# Patient Record
Sex: Male | Born: 1950 | Race: White | Hispanic: No | Marital: Married | Smoking: Never smoker
Health system: Southern US, Community
[De-identification: ages and names within clinical notes are randomized; demographics above are authoritative.]

## PROBLEM LIST (undated history)

## (undated) DIAGNOSIS — I10 Essential (primary) hypertension: Secondary | ICD-10-CM

## (undated) DIAGNOSIS — M1990 Unspecified osteoarthritis, unspecified site: Secondary | ICD-10-CM

## (undated) DIAGNOSIS — E669 Obesity, unspecified: Secondary | ICD-10-CM

## (undated) DIAGNOSIS — N183 Chronic kidney disease, stage 3 unspecified: Secondary | ICD-10-CM

## (undated) DIAGNOSIS — N289 Disorder of kidney and ureter, unspecified: Secondary | ICD-10-CM

## (undated) DIAGNOSIS — H269 Unspecified cataract: Secondary | ICD-10-CM

## (undated) DIAGNOSIS — C4491 Basal cell carcinoma of skin, unspecified: Secondary | ICD-10-CM

## (undated) HISTORY — DX: Unspecified osteoarthritis, unspecified site: M19.90

## (undated) HISTORY — PX: POLYPECTOMY: SHX149

## (undated) HISTORY — DX: Chronic kidney disease, stage 3 (moderate): N18.3

## (undated) HISTORY — DX: Basal cell carcinoma of skin, unspecified: C44.91

## (undated) HISTORY — DX: Chronic kidney disease, stage 3 unspecified: N18.30

## (undated) HISTORY — DX: Obesity, unspecified: E66.9

## (undated) HISTORY — DX: Unspecified cataract: H26.9

## (undated) HISTORY — DX: Disorder of kidney and ureter, unspecified: N28.9

## (undated) HISTORY — DX: Essential (primary) hypertension: I10

## (undated) HISTORY — PX: OTHER SURGICAL HISTORY: SHX169

---

## 2004-08-05 ENCOUNTER — Ambulatory Visit: Payer: Self-pay | Admitting: Internal Medicine

## 2004-10-14 ENCOUNTER — Ambulatory Visit: Payer: Self-pay | Admitting: Internal Medicine

## 2004-11-11 ENCOUNTER — Ambulatory Visit: Payer: Self-pay | Admitting: Internal Medicine

## 2004-12-02 ENCOUNTER — Ambulatory Visit: Payer: Self-pay | Admitting: Internal Medicine

## 2005-03-10 ENCOUNTER — Ambulatory Visit: Payer: Self-pay | Admitting: Internal Medicine

## 2005-06-30 ENCOUNTER — Ambulatory Visit: Payer: Self-pay | Admitting: Internal Medicine

## 2005-10-06 ENCOUNTER — Ambulatory Visit: Payer: Self-pay | Admitting: Internal Medicine

## 2005-12-06 ENCOUNTER — Ambulatory Visit: Payer: Self-pay | Admitting: Physical Medicine & Rehabilitation

## 2005-12-06 ENCOUNTER — Encounter
Admission: RE | Admit: 2005-12-06 | Discharge: 2006-03-06 | Payer: Self-pay | Admitting: Physical Medicine & Rehabilitation

## 2005-12-13 ENCOUNTER — Encounter
Admission: RE | Admit: 2005-12-13 | Discharge: 2006-02-23 | Payer: Self-pay | Admitting: Physical Medicine & Rehabilitation

## 2006-01-03 ENCOUNTER — Ambulatory Visit: Payer: Self-pay | Admitting: Internal Medicine

## 2006-04-27 ENCOUNTER — Ambulatory Visit: Payer: Self-pay | Admitting: Internal Medicine

## 2006-08-10 ENCOUNTER — Ambulatory Visit: Payer: Self-pay | Admitting: Internal Medicine

## 2006-08-10 LAB — CONVERTED CEMR LAB: Microalb Creat Ratio: 83.3 mg/g — ABNORMAL HIGH (ref 0.0–30.0)

## 2006-09-28 ENCOUNTER — Ambulatory Visit: Payer: Self-pay | Admitting: Internal Medicine

## 2006-09-28 LAB — CONVERTED CEMR LAB
CO2: 27 meq/L (ref 19–32)
Calcium: 9.6 mg/dL (ref 8.4–10.5)
Chloride: 104 meq/L (ref 96–112)
GFR calc non Af Amer: 56 mL/min
Glucose, Bld: 134 mg/dL — ABNORMAL HIGH (ref 70–99)

## 2006-11-09 ENCOUNTER — Ambulatory Visit: Payer: Self-pay | Admitting: Internal Medicine

## 2006-11-09 ENCOUNTER — Encounter: Payer: Self-pay | Admitting: Internal Medicine

## 2006-11-09 DIAGNOSIS — E119 Type 2 diabetes mellitus without complications: Secondary | ICD-10-CM

## 2006-11-09 DIAGNOSIS — E669 Obesity, unspecified: Secondary | ICD-10-CM | POA: Insufficient documentation

## 2006-11-09 DIAGNOSIS — I10 Essential (primary) hypertension: Secondary | ICD-10-CM

## 2006-11-09 DIAGNOSIS — M722 Plantar fascial fibromatosis: Secondary | ICD-10-CM | POA: Insufficient documentation

## 2006-11-23 ENCOUNTER — Encounter: Payer: Self-pay | Admitting: Internal Medicine

## 2006-11-23 ENCOUNTER — Ambulatory Visit: Payer: Self-pay | Admitting: Internal Medicine

## 2006-11-23 LAB — CONVERTED CEMR LAB
Calcium: 9.3 mg/dL (ref 8.4–10.5)
GFR calc Af Amer: 81 mL/min
GFR calc non Af Amer: 67 mL/min
Glucose, Bld: 135 mg/dL — ABNORMAL HIGH (ref 70–99)
HDL: 34.6 mg/dL — ABNORMAL LOW (ref >39.0)
Microalb Creat Ratio: 41.8 mg/g — ABNORMAL HIGH (ref 0.0–30.0)
Potassium: 4.6 meq/L (ref 3.5–5.1)
Total CHOL/HDL Ratio: 4.7
Triglycerides: 229 mg/dL (ref 0–149)

## 2007-02-15 ENCOUNTER — Encounter (INDEPENDENT_AMBULATORY_CARE_PROVIDER_SITE_OTHER): Payer: Self-pay | Admitting: *Deleted

## 2007-02-15 ENCOUNTER — Ambulatory Visit: Payer: Self-pay | Admitting: Internal Medicine

## 2007-02-15 LAB — CONVERTED CEMR LAB
Bilirubin Urine: NEGATIVE
Blood in Urine, dipstick: NEGATIVE
Ketones, urine, test strip: NEGATIVE
Protein, U semiquant: NEGATIVE
Urobilinogen, UA: NEGATIVE
pH: 5

## 2007-02-28 ENCOUNTER — Encounter: Payer: Self-pay | Admitting: Internal Medicine

## 2007-03-23 ENCOUNTER — Ambulatory Visit: Payer: Self-pay | Admitting: Internal Medicine

## 2007-03-23 LAB — CONVERTED CEMR LAB
OCCULT 1: NEGATIVE
OCCULT 2: NEGATIVE

## 2007-03-27 ENCOUNTER — Encounter (INDEPENDENT_AMBULATORY_CARE_PROVIDER_SITE_OTHER): Payer: Self-pay | Admitting: *Deleted

## 2007-06-26 ENCOUNTER — Ambulatory Visit: Payer: Self-pay | Admitting: Internal Medicine

## 2007-06-28 ENCOUNTER — Ambulatory Visit: Payer: Self-pay | Admitting: Internal Medicine

## 2007-07-03 LAB — CONVERTED CEMR LAB
BUN: 21 mg/dL (ref 6–23)
CO2: 27 meq/L (ref 19–32)
Calcium: 9.4 mg/dL (ref 8.4–10.5)
Creatinine, Ser: 1.4 mg/dL (ref 0.4–1.5)
GFR calc Af Amer: 68 mL/min
Hgb A1c MFr Bld: 8 % — ABNORMAL HIGH (ref 4.6–6.0)
Potassium: 4.6 meq/L (ref 3.5–5.1)

## 2007-08-25 ENCOUNTER — Telehealth (INDEPENDENT_AMBULATORY_CARE_PROVIDER_SITE_OTHER): Payer: Self-pay | Admitting: *Deleted

## 2007-09-19 ENCOUNTER — Ambulatory Visit: Payer: Self-pay | Admitting: Internal Medicine

## 2007-09-25 LAB — CONVERTED CEMR LAB
Cholesterol: 175 mg/dL (ref 0–200)
HDL: 34.6 mg/dL — ABNORMAL LOW (ref >39.0)
LDL Cholesterol: 101 mg/dL — ABNORMAL HIGH (ref 0–99)
Microalb Creat Ratio: 57.8 mg/g — ABNORMAL HIGH (ref 0.0–30.0)
Microalb, Ur: 12.4 mg/dL — ABNORMAL HIGH (ref 0.0–1.9)

## 2007-10-31 ENCOUNTER — Telehealth (INDEPENDENT_AMBULATORY_CARE_PROVIDER_SITE_OTHER): Payer: Self-pay | Admitting: *Deleted

## 2007-12-18 ENCOUNTER — Ambulatory Visit: Payer: Self-pay | Admitting: Internal Medicine

## 2007-12-20 ENCOUNTER — Telehealth: Payer: Self-pay | Admitting: Internal Medicine

## 2007-12-20 LAB — CONVERTED CEMR LAB
Chloride: 105 meq/L (ref 96–112)
Creatinine,U: 112.6 mg/dL
GFR calc Af Amer: 58 mL/min
GFR calc non Af Amer: 48 mL/min
Hgb A1c MFr Bld: 7.6 % — ABNORMAL HIGH (ref 4.6–6.0)
Microalb Creat Ratio: 60.4 mg/g — ABNORMAL HIGH (ref 0.0–30.0)
Microalb, Ur: 6.8 mg/dL — ABNORMAL HIGH (ref 0.0–1.9)
Potassium: 4.6 meq/L (ref 3.5–5.1)
Sodium: 135 meq/L (ref 135–145)

## 2008-03-12 ENCOUNTER — Encounter: Payer: Self-pay | Admitting: Internal Medicine

## 2008-03-20 ENCOUNTER — Ambulatory Visit: Payer: Self-pay | Admitting: Internal Medicine

## 2008-03-25 ENCOUNTER — Telehealth (INDEPENDENT_AMBULATORY_CARE_PROVIDER_SITE_OTHER): Payer: Self-pay | Admitting: *Deleted

## 2008-03-25 LAB — CONVERTED CEMR LAB
ALT: 35 units/L (ref 0–53)
AST: 24 units/L (ref 0–37)
BUN: 32 mg/dL — ABNORMAL HIGH (ref 6–23)
CO2: 24 meq/L (ref 19–32)
Calcium: 9.4 mg/dL (ref 8.4–10.5)
Creatinine, Ser: 1.9 mg/dL — ABNORMAL HIGH (ref 0.4–1.5)
GFR calc Af Amer: 47 mL/min
Glucose, Bld: 113 mg/dL — ABNORMAL HIGH (ref 70–99)
Microalb, Ur: 3.7 mg/dL — ABNORMAL HIGH (ref 0.0–1.9)

## 2008-06-18 ENCOUNTER — Ambulatory Visit: Payer: Self-pay | Admitting: Internal Medicine

## 2008-06-18 DIAGNOSIS — N183 Chronic kidney disease, stage 3 (moderate): Secondary | ICD-10-CM

## 2008-06-19 ENCOUNTER — Encounter: Payer: Self-pay | Admitting: Internal Medicine

## 2008-09-11 ENCOUNTER — Encounter: Payer: Self-pay | Admitting: Internal Medicine

## 2008-09-19 ENCOUNTER — Ambulatory Visit: Payer: Self-pay | Admitting: Internal Medicine

## 2008-09-19 ENCOUNTER — Encounter (INDEPENDENT_AMBULATORY_CARE_PROVIDER_SITE_OTHER): Payer: Self-pay | Admitting: *Deleted

## 2008-10-08 ENCOUNTER — Ambulatory Visit: Payer: Self-pay | Admitting: Internal Medicine

## 2008-10-14 ENCOUNTER — Encounter (INDEPENDENT_AMBULATORY_CARE_PROVIDER_SITE_OTHER): Payer: Self-pay | Admitting: *Deleted

## 2008-12-23 ENCOUNTER — Encounter (INDEPENDENT_AMBULATORY_CARE_PROVIDER_SITE_OTHER): Payer: Self-pay | Admitting: *Deleted

## 2009-01-22 ENCOUNTER — Ambulatory Visit: Payer: Self-pay | Admitting: Internal Medicine

## 2009-01-30 ENCOUNTER — Telehealth (INDEPENDENT_AMBULATORY_CARE_PROVIDER_SITE_OTHER): Payer: Self-pay | Admitting: *Deleted

## 2009-01-30 LAB — CONVERTED CEMR LAB
AST: 26 units/L (ref 0–37)
Calcium: 9.5 mg/dL (ref 8.4–10.5)
Creatinine, Ser: 1.8 mg/dL — ABNORMAL HIGH (ref 0.4–1.5)
GFR calc non Af Amer: 41.49 mL/min (ref >60)
Glucose, Bld: 90 mg/dL (ref 70–99)
Hgb A1c MFr Bld: 6.5 % (ref 4.6–6.5)
Sodium: 138 meq/L (ref 135–145)

## 2009-03-17 ENCOUNTER — Encounter: Payer: Self-pay | Admitting: Internal Medicine

## 2009-03-27 ENCOUNTER — Encounter: Payer: Self-pay | Admitting: Internal Medicine

## 2009-05-06 ENCOUNTER — Encounter: Payer: Self-pay | Admitting: Internal Medicine

## 2009-05-23 ENCOUNTER — Ambulatory Visit: Payer: Self-pay | Admitting: Internal Medicine

## 2009-05-27 ENCOUNTER — Encounter (INDEPENDENT_AMBULATORY_CARE_PROVIDER_SITE_OTHER): Payer: Self-pay | Admitting: *Deleted

## 2009-05-27 LAB — CONVERTED CEMR LAB
Calcium: 9.5 mg/dL (ref 8.4–10.5)
Creatinine, Ser: 1.7 mg/dL — ABNORMAL HIGH (ref 0.4–1.5)
Creatinine,U: 81 mg/dL
GFR calc non Af Amer: 44.27 mL/min (ref >60)
Glucose, Bld: 104 mg/dL — ABNORMAL HIGH (ref 70–99)
Sodium: 138 meq/L (ref 135–145)

## 2009-09-26 ENCOUNTER — Ambulatory Visit: Payer: Self-pay | Admitting: Internal Medicine

## 2009-09-29 LAB — CONVERTED CEMR LAB
ALT: 40 units/L (ref 0–53)
BUN: 23 mg/dL (ref 6–23)
Basophils Absolute: 0.1 10*3/uL (ref 0.0–0.1)
Chloride: 105 meq/L (ref 96–112)
Creatinine, Ser: 1.7 mg/dL — ABNORMAL HIGH (ref 0.4–1.5)
Direct LDL: 108.9 mg/dL
Eosinophils Relative: 5.2 % — ABNORMAL HIGH (ref 0.0–5.0)
Glucose, Bld: 123 mg/dL — ABNORMAL HIGH (ref 70–99)
HCT: 40.7 % (ref 39.0–52.0)
HDL: 44 mg/dL (ref >39.00)
Hgb A1c MFr Bld: 6.6 % — ABNORMAL HIGH (ref 4.6–6.5)
Lymphocytes Relative: 20.5 % (ref 12.0–46.0)
Lymphs Abs: 1.4 10*3/uL (ref 0.7–4.0)
Monocytes Relative: 8.1 % (ref 3.0–12.0)
Neutrophils Relative %: 65.1 % (ref 43.0–77.0)
PSA: 0.63 ng/mL (ref 0.10–4.00)
Platelets: 288 10*3/uL (ref 150.0–400.0)
Potassium: 4.9 meq/L (ref 3.5–5.1)
RDW: 12.6 % (ref 11.5–14.6)
Total CHOL/HDL Ratio: 4
Triglycerides: 201 mg/dL — ABNORMAL HIGH (ref 0.0–149.0)
VLDL: 40.2 mg/dL — ABNORMAL HIGH (ref 0.0–40.0)
WBC: 6.9 10*3/uL (ref 4.5–10.5)

## 2010-01-13 ENCOUNTER — Encounter: Payer: Self-pay | Admitting: Internal Medicine

## 2010-01-29 ENCOUNTER — Encounter (INDEPENDENT_AMBULATORY_CARE_PROVIDER_SITE_OTHER): Payer: Self-pay | Admitting: *Deleted

## 2010-01-30 ENCOUNTER — Ambulatory Visit: Payer: Self-pay | Admitting: Internal Medicine

## 2010-01-30 ENCOUNTER — Telehealth (INDEPENDENT_AMBULATORY_CARE_PROVIDER_SITE_OTHER): Payer: Self-pay | Admitting: *Deleted

## 2010-01-30 LAB — HM DIABETES FOOT EXAM

## 2010-02-02 ENCOUNTER — Ambulatory Visit: Payer: Self-pay | Admitting: Gastroenterology

## 2010-02-02 ENCOUNTER — Encounter (INDEPENDENT_AMBULATORY_CARE_PROVIDER_SITE_OTHER): Payer: Self-pay | Admitting: *Deleted

## 2010-02-04 ENCOUNTER — Encounter (INDEPENDENT_AMBULATORY_CARE_PROVIDER_SITE_OTHER): Payer: Self-pay | Admitting: *Deleted

## 2010-02-05 LAB — CONVERTED CEMR LAB
Calcium: 9.6 mg/dL (ref 8.4–10.5)
GFR calc non Af Amer: 37.47 mL/min (ref >60)
Glucose, Bld: 126 mg/dL — ABNORMAL HIGH (ref 70–99)
Hgb A1c MFr Bld: 6.5 % (ref 4.6–6.5)
Potassium: 5.3 meq/L — ABNORMAL HIGH (ref 3.5–5.1)
Sodium: 137 meq/L (ref 135–145)

## 2010-02-13 ENCOUNTER — Ambulatory Visit: Payer: Self-pay | Admitting: Gastroenterology

## 2010-02-17 ENCOUNTER — Encounter: Payer: Self-pay | Admitting: Gastroenterology

## 2010-02-25 ENCOUNTER — Telehealth (INDEPENDENT_AMBULATORY_CARE_PROVIDER_SITE_OTHER): Payer: Self-pay | Admitting: *Deleted

## 2010-02-26 ENCOUNTER — Ambulatory Visit: Payer: Self-pay | Admitting: Internal Medicine

## 2010-02-27 LAB — CONVERTED CEMR LAB
CO2: 25 meq/L (ref 19–32)
Calcium: 9.3 mg/dL (ref 8.4–10.5)
Chloride: 102 meq/L (ref 96–112)
Creatinine, Ser: 1.8 mg/dL — ABNORMAL HIGH (ref 0.4–1.5)
Sodium: 135 meq/L (ref 135–145)

## 2010-03-17 ENCOUNTER — Encounter: Payer: Self-pay | Admitting: Internal Medicine

## 2010-04-22 ENCOUNTER — Telehealth: Payer: Self-pay | Admitting: Internal Medicine

## 2010-07-03 ENCOUNTER — Ambulatory Visit: Payer: Self-pay | Admitting: Internal Medicine

## 2010-07-03 DIAGNOSIS — F528 Other sexual dysfunction not due to a substance or known physiological condition: Secondary | ICD-10-CM

## 2010-07-03 DIAGNOSIS — M79609 Pain in unspecified limb: Secondary | ICD-10-CM | POA: Insufficient documentation

## 2010-07-03 LAB — CONVERTED CEMR LAB: Vit D, 25-Hydroxy: 37 ng/mL (ref 30–89)

## 2010-07-10 ENCOUNTER — Encounter (INDEPENDENT_AMBULATORY_CARE_PROVIDER_SITE_OTHER): Payer: Self-pay | Admitting: *Deleted

## 2010-07-10 LAB — CONVERTED CEMR LAB
AST: 37 units/L (ref 0–37)
Chloride: 104 meq/L (ref 96–112)
GFR calc non Af Amer: 46.29 mL/min (ref >60)
Glucose, Bld: 116 mg/dL — ABNORMAL HIGH (ref 70–99)
Hgb A1c MFr Bld: 7 % — ABNORMAL HIGH (ref 4.6–6.5)
Potassium: 4.6 meq/L (ref 3.5–5.1)
Sodium: 139 meq/L (ref 135–145)
Total CK: 92 units/L (ref 7–232)

## 2010-08-04 ENCOUNTER — Encounter: Payer: Self-pay | Admitting: Internal Medicine

## 2010-09-27 LAB — CONVERTED CEMR LAB
ALT: 31 units/L (ref 0–53)
BUN: 23 mg/dL
Basophils Absolute: 0 10*3/uL (ref 0.0–0.1)
CO2, serum: 26 mmol/L
Calcium: 9.7 mg/dL
Cholesterol: 163 mg/dL (ref 0–200)
Creatinine, Ser: 1.67 mg/dL
Creatinine,U: 176.8 mg/dL
Eosinophils Absolute: 0.3 10*3/uL (ref 0.0–0.6)
Eosinophils Relative: 5.6 % — ABNORMAL HIGH (ref 0.0–5.0)
Glucose, Bld: 121 mg/dL
HCT: 40.7 % (ref 39.0–52.0)
HDL: 30.3 mg/dL — ABNORMAL LOW (ref >39.0)
Hemoglobin: 13.9 g/dL (ref 13.0–17.0)
Hgb A1c MFr Bld: 7.1 % — ABNORMAL HIGH (ref 4.6–6.0)
LDL Cholesterol: 100 mg/dL — ABNORMAL HIGH (ref 0–99)
MCHC: 35 g/dL (ref 30.0–36.0)
MCV: 92.9 fL (ref 78.0–100.0)
Microalb Creat Ratio: 46.9 mg/g — ABNORMAL HIGH (ref 0.0–30.0)
Monocytes Absolute: 0.7 10*3/uL (ref 0.1–1.0)
Monocytes Relative: 7.8 % (ref 3.0–11.0)
Monocytes Relative: 8.7 % (ref 3.0–12.0)
Neutro Abs: 4.8 10*3/uL (ref 1.4–7.7)
PSA: 0.49 ng/mL (ref 0.10–4.00)
PSA: 0.63 ng/mL (ref 0.10–4.00)
RBC: 4.25 M/uL (ref 4.22–5.81)
RBC: 4.3 M/uL (ref 4.22–5.81)
RDW: 12.3 % (ref 11.5–14.6)
TSH: 1.71 microintl units/mL (ref 0.35–5.50)
Total CHOL/HDL Ratio: 4.1
Total CHOL/HDL Ratio: 5.4
Triglycerides: 143 mg/dL (ref 0–149)
Triglycerides: 162 mg/dL — ABNORMAL HIGH (ref 0–149)
WBC: 7.5 10*3/uL (ref 4.5–10.5)

## 2010-09-29 NOTE — Letter (Signed)
Summary: [facility] Instructions  [facility]  [address] [place], [place] [address]   Phone: [phone]  Fax: [phone]       Jimmy [name]    01-18-1951    MRN: [id number]        Procedure Day /Date: Friday 02/13/2010     Arrival Time: 10:00 am      Procedure Time: 11:00 am     Location of Procedure:                    _x _  [facility] (4th Floor)                        PREPARATION FOR COLONOSCOPY WITH MOVIPREP   Starting 5 days prior to your procedure Sunday 6/12 do not eat nuts, seeds, popcorn, corn, beans, peas,  salads, or any raw vegetables.  Do not take any fiber supplements (e.g. Metamucil, Citrucel, and Benefiber).  THE DAY BEFORE YOUR PROCEDURE         DATE: Thursday 6/16  1.  Drink clear liquids the entire day-NO SOLID FOOD  2.  Do not drink anything colored red or purple.  Avoid juices with pulp.  No orange juice.  3.  Drink at least 64 oz. (8 glasses) of fluid/clear liquids during the day to prevent dehydration and help the prep work efficiently.  CLEAR LIQUIDS INCLUDE: Water Jello Ice Popsicles Tea (sugar ok, no milk/cream) Powdered fruit flavored drinks Coffee (sugar ok, no milk/cream) Gatorade Juice: apple, white grape, white cranberry  Lemonade Clear bullion, consomm, broth Carbonated beverages (any kind) Strained chicken noodle soup Hard Candy                             4.  In the morning, mix first dose of MoviPrep solution:    Empty 1 Pouch A and 1 Pouch B into the disposable container    Add lukewarm drinking water to the top line of the container. Mix to dissolve    Refrigerate (mixed solution should be used within 24 hrs)  5.  Begin drinking the prep at 5:00 p.m. The MoviPrep container is divided by 4 marks.   Every 15 minutes drink the solution down to the next mark (approximately 8 oz) until the full liter is complete.   6.  Follow completed prep with 16 oz of clear liquid of your choice (Nothing red  or purple).  Continue to drink clear liquids until bedtime.  7.  Before going to bed, mix second dose of MoviPrep solution:    Empty 1 Pouch A and 1 Pouch B into the disposable container    Add lukewarm drinking water to the top line of the container. Mix to dissolve    Refrigerate  THE DAY OF YOUR PROCEDURE      DATE: Friday 6/17  Beginning at 6:00 a.m. (5 hours before procedure):         1. Every 15 minutes, drink the solution down to the next mark (approx 8 oz) until the full liter is complete.  2. Follow completed prep with 16 oz. of clear liquid of your choice.    3. You may drink clear liquids until 9:00 am (2 HOURS BEFORE PROCEDURE).   MEDICATION INSTRUCTIONS  Unless otherwise instructed, you should take regular prescription medications with a small sip of water   as early as possible the morning of your  procedure.  Diabetic patients - see separate instructions.         OTHER INSTRUCTIONS  You will need a responsible adult at least 59 years of age to accompany you and drive you home.   This person must remain in the waiting room during your procedure.  Wear loose fitting clothing that is easily removed.  Leave jewelry and other valuables at home.  However, you may wish to bring a book to read or  an iPod/MP3 player to listen to music as you wait for your procedure to start.  Remove all body piercing jewelry and leave at home.  Total time from sign-in until discharge is approximately 2-3 hours.  You should go home directly after your procedure and rest.  You can resume normal activities the  day after your procedure.  The day of your procedure you should not:   Drive   Make legal decisions   Operate machinery   Drink alcohol   Return to work  You will receive specific instructions about eating, activities and medications before you leave.    The above instructions have been reviewed and explained to me by   Karl Bales RN  February 02, 2010  10:53 AM    I fully understand and can verbalize these instructions _____________________________ Date _________

## 2010-09-29 NOTE — Letter (Signed)
Summary: Results Letter   Gastroenterology  [address] [place], [place] [address]   Phone: [phone]  Fax: [phone]        February 17, 2010 MRN: [id number]    Jimmy [name] [address] [place], [place]  [address]    Dear Mr. Aldape,   At least one of the polyps removed during your recent procedure was proven to be adenomatous.  These are pre-cancerous polyps that may have grown into cancers if they had not been removed.  Based on current nationally recognized surveillance guidelines, I recommend that you have a repeat colonoscopy in 5 years.  We will therefore put your information in our reminder system and will contact you in 5 years to schedule a repeat procedure.  Please call if you have any questions or concerns.       Sincerely,  Rachael Fee MD  This letter has been electronically signed by your physician.  Appended Document: Results Letter letter mailed.

## 2010-09-29 NOTE — Assessment & Plan Note (Signed)
Summary: CPX--YEARLY//PH   Vital Signs:  Patient profile:   60 year old male Height:      71 inches Weight:      325.6 pounds BMI:     45.58 Pulse rate:   80 / minute Pulse rhythm:   regular BP sitting:   142 / 80  (left arm) Cuff size:   large  Vitals Entered By: Shary Decamp (September 26, 2009 8:08 AM) CC: cpx - fasting, pt has not had colonoscopy pt would like to wait until June Is Patient Diabetic? Yes   History of Present Illness: CPX not able to exercise as much due to work dry-flaky skin in the winters, facial, hydrocortisone OTC helps   Preventive Screening-Counseling & Management  Alcohol-Tobacco     Alcohol drinks/day: seldom     Smoking Status: never  Caffeine-Diet-Exercise     Caffeine use/day: 1-2     Does Patient Exercise: no  Current Medications (verified): 1)  Micardis 80 Mg  Tabs (Telmisartan) .Marland Kitchen.. 1 By Mouth Qd 2)  Norvasc 10 Mg  Tabs (Amlodipine Besylate) .... 1/2 By Mouth Qd 3)  Bayer Childrens Aspirin 81 Mg  Chew (Aspirin) .Marland Kitchen.. 1 By Mouth Once Daily 4)  Viagra 100 Mg  Tabs (Sildenafil Citrate) .... As Needed 5)  Metformin Hcl 1000 Mg  Tabs (Metformin Hcl) .... Bid 6)  Metoprolol Tartrate 50 Mg  Tabs (Metoprolol Tartrate) .Marland Kitchen.. 1 Bid 7)  Actos 30 Mg  Tabs (Pioglitazone Hcl) .Marland Kitchen.. 1 By Mouth Qd  Allergies (verified): 1)  ! Diovan 2)  Benazepril Hcl (Benazepril Hcl)  Past History:  Past Medical History: Reviewed history from 05/23/2009 and no changes required. HYPERTENSION   DIABETES MELLITUS, TYPE II   OBESITY NOS  PLANTAR FASCIITIS, BILATERAL  Renal insufficiency  Past Surgical History: Reviewed history from 09/19/2008 and no changes required. pilonidal cyst surgery  Family History: Reviewed history from 09/19/2008 and no changes required. colon ca--no prostate cancer--no MI-- GF 19s y/o DM-- no M- deceased, Ca F - deceased - kidney failure  Social History: Married 4 children Occupation: Risk analyst, Runner, broadcasting/film/video @ [employer] not  able to exercise as much due to work not eating as healthy tobacco--no ETOH--sociallyCaffeine use/day:  1-2 Does Patient Exercise:  no  Review of Systems General:  Denies fever and weight loss; (+) fatigue, working long hours . CV:  Denies chest pain or discomfort, palpitations, and swelling of feet. Resp:  Denies cough and shortness of breath. GI:  Denies nausea and vomiting; blood in the stool x 1last month , happened w/ painful defecation , remote h/o hemorrhoids . GU:  Denies dysuria and hematuria.  Physical Exam  General:  alert, well-developed, and overweight-appearing.   Neck:  no masses, no thyromegaly, and normal carotid upstroke.   Lungs:  normal respiratory effort, no intercostal retractions, no accessory muscle use, and normal breath sounds.   Heart:  normal rate, regular rhythm, no murmur, and no gallop.   Abdomen:  soft, non-tender, and no distention.   Rectal:  small external hemorrhoids x 2 noted. Normal sphincter tone. No rectal masses or tenderness.  brown stools, Hemoccult negative Prostate:  Prostate gland firm and smooth, no enlargement, nodularity, tenderness, mass, asymmetry or induration. Extremities:  trace symetric edema   Impression & Recommendations:  Problem # 1:  HEALTH MAINTENANCE EXAM (ICD-V70.0) Td 08 had flu shot pneumonia shot 09/26/09  hemocults (-) 2-10 had a single episode of painful defecation with a small amount of blood per rectum.  Exams showed external  hemorrhoids, Hemoccult negative today. As long as he has a colonoscopy in June as he is planning, no further workup.  Patient to call  if he has further blood in the stools.   patient education: Reinforced the need to keep active and eat well.    Orders: TLB-ALT (SGPT) (84460-ALT) TLB-AST (SGOT) (84450-SGOT) TLB-Lipid Panel (80061-LIPID) TLB-CBC Platelet - w/Differential (85025-CBCD) TLB-PSA (Prostate Specific Antigen) (84153-PSA) Gastroenterology Referral (GI)  Problem # 2:   HYPERTENSION (ICD-401.9) no change His updated medication list for this problem includes:    Micardis 80 Mg Tabs (Telmisartan) .Marland Kitchen... 1 by mouth qd    Norvasc 10 Mg Tabs (Amlodipine besylate) .Marland Kitchen... 1/2 by mouth qd    Metoprolol Tartrate 50 Mg Tabs (Metoprolol tartrate) .Marland Kitchen... 1 bid  Orders: Venipuncture ([address]) TLB-BMP (Basic Metabolic Panel-BMET) (80048-METABOL)  BP today: 142/80 Prior BP: 132/80 (05/23/2009)  Labs Reviewed: K+: 5.1 (05/23/2009) Creat: : 1.7 (05/23/2009)   Chol: 166 (09/19/2008)   HDL: 40.1 (09/19/2008)   LDL: 97 (09/19/2008)   TG: 143 (09/19/2008)  Problem # 3:  DIABETES MELLITUS, TYPE II (ICD-250.00) due for labs His updated medication list for this problem includes:    Micardis 80 Mg Tabs (Telmisartan) .Marland Kitchen... 1 by mouth qd    Bayer Childrens Aspirin 81 Mg Chew (Aspirin) .Marland Kitchen... 1 by mouth once daily    Metformin Hcl 1000 Mg Tabs (Metformin hcl) ..... Bid    Actos 30 Mg Tabs (Pioglitazone hcl) .Marland Kitchen... 1 by mouth qd  Orders: TLB-A1C / Hgb A1C (Glycohemoglobin) (83036-A1C)  Labs Reviewed: Creat: 1.7 (05/23/2009)     Last Eye Exam: normal (03/17/2009) Reviewed HgBA1c results: 6.2 (05/23/2009)  6.5 (01/22/2009)  Complete Medication List: 1)  Micardis 80 Mg Tabs (Telmisartan) .Marland Kitchen.. 1 by mouth qd 2)  Norvasc 10 Mg Tabs (Amlodipine besylate) .... 1/2 by mouth qd 3)  Bayer Childrens Aspirin 81 Mg Chew (Aspirin) .Marland Kitchen.. 1 by mouth once daily 4)  Viagra 100 Mg Tabs (Sildenafil citrate) .... As needed 5)  Metformin Hcl 1000 Mg Tabs (Metformin hcl) .... Bid 6)  Metoprolol Tartrate 50 Mg Tabs (Metoprolol tartrate) .Marland Kitchen.. 1 bid 7)  Actos 30 Mg Tabs (Pioglitazone hcl) .Marland Kitchen.. 1 by mouth qd  Other Orders: Pneumococcal Vaccine (60454) Admin 1st Vaccine (09811)  Patient Instructions: 1)  Please schedule a follow-up appointment in 4 months .    Immunizations Administered:  Pneumonia Vaccine:    Vaccine Type: Pneumovax    Site: right deltoid    Mfr: Merck    Dose: 0.5 ml     Route: IM    Given by: Shary Decamp    Exp. Date: 09/25/2010    Lot #: [id number]    Preventive Care Screening  Prior Values:    PSA:  0.63 (09/19/2008)    Last Tetanus Booster:  Tdap (02/15/2007)    Last Flu Shot:  Fluvax Non-MCR (05/23/2009)

## 2010-09-29 NOTE — Assessment & Plan Note (Signed)
Summary: 4 month ov//ph   Vital Signs:  Patient profile:   60 year old male Height:      71 inches Weight:      321 pounds Temp:     98.5 degrees F oral Pulse rate:   66 / minute BP sitting:   122 / 82  (left arm)  Vitals Entered By: Jeremy Johann CMA (January 30, 2010 8:07 AM) CC: 4 month f/u  Comments --dm,bp --refills --fasting REVIEWED MED LIST, PATIENT AGREED DOSE AND INSTRUCTION CORRECT    History of Present Illness: ROV HYPERTENSION  --  RFs needed , ambulatory BPs 120-130s, occasionally 140. DBP 60s DIABETES-- needs RF, ambulatory CBGs varies from 111 to 180 (non fasting) Renal insufficiency,  note from nephrology 5/11 reviewed, stable concern about "bunions"  Allergies: 1)  ! Diovan 2)  Benazepril Hcl (Benazepril Hcl)  Past History:  Past Medical History: Reviewed history from 05/23/2009 and no changes required. HYPERTENSION   DIABETES MELLITUS, TYPE II   OBESITY NOS  PLANTAR FASCIITIS, BILATERAL  Renal insufficiency  Past Surgical History: Reviewed history from 09/19/2008 and no changes required. pilonidal cyst surgery  Social History: Reviewed history from 09/26/2009 and no changes required. 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 Resp:  Denies cough and shortness of breath. GI:  Denies diarrhea, nausea, and vomiting; still occasionally sees blood in the toilete paper.  Physical Exam  General:  alert, well-developed, and overweight-appearing.   Lungs:  normal respiratory effort, no intercostal retractions, no accessory muscle use, and normal breath sounds.   Heart:  normal rate, regular rhythm, no murmur, and no gallop.   Extremities:  no pretibial edema bilaterally  mild deformities c/w bunions B  Diabetes Management Exam:    Foot Exam (with socks  and/or shoes not present):       Sensory-Pinprick/Light touch:          Left medial foot (L-4): normal          Left dorsal foot (L-5): normal          Left lateral foot (S-1): normal          Right medial foot (L-4): normal          Right dorsal foot (L-5): normal          Right lateral foot (S-1): normal       Sensory-Monofilament:          Left foot: normal          Right foot: normal       Inspection:          Left foot: normal          Right foot: normal       Nails:          Left foot: normal          Right foot: normal   Impression & Recommendations:  Problem # 1:  RENAL INSUFFICIENCY (ICD-588.9) stable, renal note reviewed will forward labs to them  Problem # 2:  HYPERTENSION (ICD-401.9) at goal, no change  His updated medication list for this problem includes:    Micardis 80 Mg Tabs (Telmisartan) .Marland Kitchen... 1 by mouth qd    Norvasc 10 Mg Tabs (Amlodipine besylate) .Marland Kitchen... 1/2 by mouth qd    Metoprolol Tartrate 50  Mg Tabs (Metoprolol tartrate) .Marland Kitchen... 1 bid  Orders: TLB-BMP (Basic Metabolic Panel-BMET) (80048-METABOL)  BP today: 122/82 Prior BP: 142/80 (09/26/2009)  Labs Reviewed: K+: 4.9 (09/26/2009) Creat: : 1.7 (09/26/2009)   Chol: 170 (09/26/2009)   HDL: 44.00 (09/26/2009)   LDL: 97 (09/19/2008)   TG: 201.0 (09/26/2009)  Problem # 3:  DIABETES MELLITUS, TYPE II (ICD-250.00) at goal  no chnage  His updated medication list for this problem includes:    Micardis 80 Mg Tabs (Telmisartan) .Marland Kitchen... 1 by mouth qd    Bayer Childrens Aspirin 81 Mg Chew (Aspirin) .Marland Kitchen... 1 by mouth once daily    Metformin Hcl 1000 Mg Tabs (Metformin hcl) ..... Bid    Actos 30 Mg Tabs (Pioglitazone hcl) .Marland Kitchen... 1 by mouth qd  Orders: Venipuncture ([address]) TLB-A1C / Hgb A1C (Glycohemoglobin) (83036-A1C)  Labs Reviewed: Creat: 1.7 (09/26/2009)     Last Eye Exam: normal (03/17/2009) Reviewed HgBA1c results: 6.6 (09/26/2009)  6.2 (05/23/2009)  Problem # 4:  bunions observe  Complete  Medication List: 1)  Micardis 80 Mg Tabs (Telmisartan) .Marland Kitchen.. 1 by mouth qd 2)  Norvasc 10 Mg Tabs (Amlodipine besylate) .... 1/2 by mouth qd 3)  Bayer Childrens Aspirin 81 Mg Chew (Aspirin) .Marland Kitchen.. 1 by mouth once daily 4)  Viagra 100 Mg Tabs (Sildenafil citrate) .... As needed 5)  Metformin Hcl 1000 Mg Tabs (Metformin hcl) .... Bid 6)  Metoprolol Tartrate 50 Mg Tabs (Metoprolol tartrate) .Marland Kitchen.. 1 bid 7)  Actos 30 Mg Tabs (Pioglitazone hcl) .Marland Kitchen.. 1 by mouth qd  Patient Instructions: 1)  Please schedule a follow-up appointment in 4 to 5  months .  Prescriptions: ACTOS 30 MG  TABS (PIOGLITAZONE HCL) 1 by mouth qd  #30 Tablet x 12   Entered and Authorized by:   Brayleigh Rybacki E. Zayah Keilman MD   Signed by:   Nolon Rod. Renee Erb MD on 01/30/2010   Method used:   [facility]        [facility] [phone]* (retail)       [address]       [place], [place]  [address]       Ph: [phone]       Fax: [phone]   RxID:   [id number] METFORMIN HCL 1000 MG  TABS (METFORMIN HCL) bid  #60 Tablet x 12   Entered and Authorized by:   Nolon Rod. Dajanay Northrup MD   Signed by:   Nolon Rod. Dione Petron MD on 01/30/2010   Method used:   [facility] to        [facility] [phone]* (retail)       [address]       [place], [place]  [address]       Ph: [phone]       Fax: [phone]   RxID:   [id number] NORVASC 10 MG  TABS (AMLODIPINE BESYLATE) 1/2 by mouth qd  #30 x 12   Entered and Authorized by:   Nolon Rod. Janani Chamber MD   Signed by:   Nolon Rod. Sharod Petsch MD on 01/30/2010   Method used:   [facility] to        [facility] [phone]* (retail)       [address]       [place], [place]  [address]       Ph: [phone]  Fax: [phone]   RxID:   [id number] MICARDIS 80 MG  TABS (TELMISARTAN) 1 by mouth qd  #30 x 12   Entered and Authorized by:   Nolon Rod. Jayke Caul MD   Signed by:   Nolon Rod. Peaches Vanoverbeke MD on 01/30/2010   Method used:   [facility]ically to         [facility] [phone]* (retail)       [address]       [place], [place]  [address]       Ph: [phone]       Fax: [phone]   RxID:   [id number] METOPROLOL TARTRATE 50 MG  TABS (METOPROLOL TARTRATE) 1 bid  #60 x 12   Entered and Authorized by:   Nolon Rod. Zalmen Wrightsman MD   Signed by:   Nolon Rod. Tykeshia Tourangeau MD on 01/30/2010   Method used:   [facility]ically to        [facility] [phone]* (retail)       [address]       [place], [place]  [address]       Ph: [phone]       Fax: [phone]   RxID:   [id number]

## 2010-09-29 NOTE — Progress Notes (Signed)
Summary: labs  Phone Note Outgoing Call   Summary of Call: advise patient -Diabetes well controlled -Kidney function slightly worse? Will fax results to nephrology on Monday, please asked patient  to come back in one month for a BMP -potassium slightly high, remind him about a low potassium diet, send him a copy Jose E. Paz MD  January 30, 2010 5:10 PM   Follow-up for Phone Call        spoke w/ patient aware of labs and recheck labs in 1 month.......Marland KitchenDoristine Devoid  January 30, 2010 5:13 PM

## 2010-09-29 NOTE — Letter (Signed)
Summary: renal--stable, no change   [facility]   Imported By: Lanelle Bal 01/28/2010 08:20:56  _____________________________________________________________________  External Attachment:    Type:   Image     Comment:   External Document

## 2010-09-29 NOTE — Progress Notes (Signed)
Summary: changing to losartan    Phone Note From Pharmacy   [facility] [phone]* Summary of Call: [facility] asking for a change in the patient's Micardis 80mg  to [facility] for cost issues. Please advise and call back on [phone]. Initial call taken by: Harold Barban,  April 22, 2010 8:54 AM  Follow-up for Phone Call        change micardis 80 mg to losartan 100 mg one p.o. q. day Nurse visit in 3 weeks for BP check and BMP please clarify why Diovan is in his allergy list.  Follow-up by: Nolon Rod. Paz MD,  April 22, 2010 1:15 PM  Additional Follow-up for Phone Call Additional follow up Details #1::        Left message for pt to call back. Need to inform him that we are changing his BP meds and he needs to come in for a BP check in 3 weeks. Also verify his reaction to Diovan. Army Fossa CMA  April 22, 2010 1:22 PM     Additional Follow-up for Phone Call Additional follow up Details #2::    Left message for pt to call back. Army Fossa CMA  April 23, 2010 12:42 PM  [email or web address].Marland KitchenMarland KitchenHarold Barban  April 24, 2010 9:55 AM   Additional Follow-up for Phone Call Additional follow up Details #3:: Details for Additional Follow-up Action Taken: Spoke with patient and he does not remeber what the reaction was, it was not severe though. Patient is aware of new rx and is coming in 3 week for his BP check.  Additional Follow-up by: Harold Barban,  April 24, 2010 10:05 AM  New/Updated Medications: LOSARTAN POTASSIUM 100 MG TABS (LOSARTAN POTASSIUM) 1 by mouth once daily. Prescriptions: LOSARTAN POTASSIUM 100 MG TABS (LOSARTAN POTASSIUM) 1 by mouth once daily.  #30 x 1   Entered by:   Army Fossa CMA   Authorized by:   Nolon Rod. Paz MD   Signed by:   Army Fossa CMA on 04/24/2010   Method used:   [facility] to        [facility] [phone]* (retail)       [address]       [place], [place]  [address]       Ph:  [phone]       Fax: [phone]   RxID:   [id number]

## 2010-09-29 NOTE — Assessment & Plan Note (Signed)
Summary: rto 5 months/cbs   Vital Signs:  Patient profile:   60 year old male Height:      71 inches Weight:      327.13 pounds BMI:     45.79 Pulse rate:   74 / minute Pulse rhythm:   regular BP sitting:   156 / 90  (left arm) Cuff size:   large  Vitals Entered By: Army Fossa CMA (July 03, 2010 9:05 AM) CC: 5 month f/u- fasting Comments [facility] flu shot would like to change norvascto 5mg  tab?    History of Present Illness:  routine office visit  HYPERTENSION  --  no recent ambulatory BPs    DIABETES -- life style was better during the summer , had more time to exercise and eat better   Renal insufficiency--  changed to losartan , needs labs    ED, Viagra not helping   Allergies: 1)  ! Diovan 2)  Benazepril Hcl (Benazepril Hcl)  Past History:  Past Medical History: Reviewed history from 05/23/2009 and no changes required. HYPERTENSION   DIABETES MELLITUS, TYPE II   OBESITY NOS  PLANTAR FASCIITIS, BILATERAL  Renal insufficiency  Past Surgical History: Reviewed history from 09/19/2008 and no changes required. pilonidal cyst surgery  Social History: Reviewed history from 09/26/2009 and no changes required. 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 Resp:  Denies cough and shortness of breath. GI:  Denies nausea and vomiting; occasionally blood in stools (hemorrhoids?) . MS:  complaining of leg pain, bilaterally, "muscle aches", mostly with standing, no typical claudication. Psych:  Denies anxiety and depression.  Physical Exam  General:  alert, well-developed, and overweight-appearing.   Lungs:  normal respiratory effort, no intercostal retractions, no accessory muscle use, and normal breath sounds.   Heart:  normal rate, regular rhythm, no murmur,  and no gallop.   Psych:  not anxious appearing and not depressed appearing.     Impression & Recommendations:  Problem # 1:  LEG PAIN (ICD-729.5) complain of leg  pain, mostly when he stands,   myalgias ? He is not on statins. Labs. Reminded him not  to take Motrin for pain, just acetaminophen  Orders: T-Vitamin D (25-Hydroxy) ([address]-60454) TLB-CK Total Only(Creatine Kinase/CPK) (82550-CK)  Problem # 2:  HYPERTENSION (ICD-401.9) we had to switch from micardis to losartan BP today elevated, no ambulatory BPs Labs, see instructions, will asked patient to check his BPs His updated medication list for this problem includes:    Losartan Potassium 100 Mg Tabs (Losartan potassium) .Marland Kitchen... 1 by mouth once daily. needs office visit.    Norvasc 10 Mg Tabs (Amlodipine besylate) .Marland Kitchen... 1/2 by mouth qd    Metoprolol Tartrate 50 Mg Tabs (Metoprolol tartrate) .Marland Kitchen... 1/2 two times a day    BP today: 156/90 Prior BP: 122/82 (01/30/2010)  Labs Reviewed: K+: 4.7 (02/26/2010) Creat: : 1.8 (02/26/2010)   Chol: 170 (09/26/2009)   HDL: 44.00 (09/26/2009)   LDL: 97 (09/19/2008)   TG: 201.0 (09/26/2009)  Orders: Venipuncture (09811) TLB-BMP (Basic Metabolic Panel-BMET) (80048-METABOL) Specimen Handling (91478)  Problem # 3:  DIABETES MELLITUS, TYPE II (ICD-250.00) labs, we discussed diet and exercise, patient limited by his schedule His updated medication list for this problem includes:    Losartan Potassium 100 Mg Tabs (Losartan potassium) .Marland Kitchen... 1 by mouth once  daily. needs office visit.    Bayer Childrens Aspirin 81 Mg Chew (Aspirin) .Marland Kitchen... 1 by mouth once daily    Metformin Hcl 1000 Mg Tabs (Metformin hcl) ..... Bid    Actos 30 Mg Tabs (Pioglitazone hcl) .Marland Kitchen... 1 by mouth qd    Labs Reviewed: Creat: 1.8 (02/26/2010)     Last Eye Exam: normal (03/17/2009) Reviewed HgBA1c results: 6.5 (01/30/2010)  6.6 (09/26/2009)  Orders: TLB-A1C / Hgb A1C (Glycohemoglobin) (83036-A1C) TLB-ALT (SGPT)  (84460-ALT) TLB-AST (SGOT) (84450-SGOT) Specimen Handling ([address])  Problem # 4:  ERECTILE DYSFUNCTION, NON-ORGANIC (ICD-302.72) has used Viagra without side effects but with no effect We'll try Cialis, 5 mg tablets x 30 provided, see instructions The following medications were removed from the medication list:    Viagra 100 Mg Tabs (Sildenafil citrate) .Marland Kitchen... As needed  Complete Medication List: 1)  Losartan Potassium 100 Mg Tabs (Losartan potassium) .Marland Kitchen.. 1 by mouth once daily. needs office visit. 2)  Norvasc 10 Mg Tabs (Amlodipine besylate) .... 1/2 by mouth qd 3)  Metoprolol Tartrate 50 Mg Tabs (Metoprolol tartrate) .... 1/2 two times a day 4)  Bayer Childrens Aspirin 81 Mg Chew (Aspirin) .Marland Kitchen.. 1 by mouth once daily 5)  Metformin Hcl 1000 Mg Tabs (Metformin hcl) .... Bid 6)  Actos 30 Mg Tabs (Pioglitazone hcl) .Marland Kitchen.. 1 by mouth qd 7)  Tylenol Pm Extra Strength 500-25 Mg Tabs (Diphenhydramine-apap (sleep)) .... 2-4 daily 8)  Vitamin C, Centrum Silver, Fish Oil, Vitamin D3 100 I.u.   Other Orders: Admin 1st Vaccine (60454) Flu Vaccine 16yrs + [phone])  Patient Instructions: 1)  checked her BP at home or at work twice a week, call if BP more than 140/85. 2)  Low-salt diet 3)  try Cialis 5 mg 2 or 4 tablets daily. See if that works better than Viagra 4)  Please schedule a follow-up appointment in 4 months .  Flu Vaccine Consent Questions     Do you have a history of severe allergic reactions to this vaccine? no    Any prior history of allergic reactions to egg and/or gelatin? no    Do you have a sensitivity to the preservative Thimersol? no    Do you have a past history of Guillan-Barre Syndrome? no    Do you currently have an acute febrile illness? no    Have you ever had a severe reaction to latex? no    Vaccine information given and explained to patient? yes    Are you currently pregnant? no    Lot Number:[id number]   Exp Date:02/27/2011   Site Given  Left Deltoid IM  Orders  Added: 1)  Admin 1st Vaccine [90471] 2)  Flu Vaccine 15yrs + [90658] 3)  Venipuncture [91478] 4)  TLB-BMP (Basic Metabolic Panel-BMET) [80048-METABOL] 5)  TLB-A1C / Hgb A1C (Glycohemoglobin) [83036-A1C] 6)  TLB-ALT (SGPT) [84460-ALT] 7)  TLB-AST (SGOT) [84450-SGOT] 8)  T-Vitamin D (25-Hydroxy) [29562-13086] 9)  TLB-CK Total Only(Creatine Kinase/CPK) [82550-CK] 10)  Specimen Handling [99000] 11)  Est. Patient Level IV [57846]     .lbflu

## 2010-09-29 NOTE — Letter (Signed)
Summary: neg. diabetic eye exam--[facility]  [facility]   Imported By: Lanelle Bal 03/27/2010 11:09:28  _____________________________________________________________________  External Attachment:    Type:   Image     Comment:   External Document

## 2010-09-29 NOTE — Letter (Signed)
Summary: Moviprep Instructions  [facility]  [address]   [place], [place] [address]   Phone: [phone]  Fax: [phone]       Jimmy [name]    25-Sep-1950    MRN: [id number]        Procedure Day Dorna Bloom: Friday, 02-13-10     Arrival Time:10:00 a.m.     Procedure Time: 11:00 a.m.     Location of Procedure:                    _x _  [facility] (4th Floor)                        PREPARATION FOR COLONOSCOPY WITH MOVIPREP   Starting 5 days prior to your procedure 02-08-10 do not eat nuts, seeds, popcorn, corn, beans, peas,  salads, or any raw vegetables.  Do not take any fiber supplements (e.g. Metamucil, Citrucel, and Benefiber).  THE DAY BEFORE YOUR PROCEDURE         DATE:  02-12-10  DAY: Thursday  1.  Drink clear liquids the entire day-NO SOLID FOOD  2.  Do not drink anything colored red or purple.  Avoid juices with pulp.  No orange juice.  3.  Drink at least 64 oz. (8 glasses) of fluid/clear liquids during the day to prevent dehydration and help the prep work efficiently.  CLEAR LIQUIDS INCLUDE: Water Jello Ice Popsicles Tea (sugar ok, no milk/cream) Powdered fruit flavored drinks Coffee (sugar ok, no milk/cream) Gatorade Juice: apple, white grape, white cranberry  Lemonade Clear bullion, consomm, broth Carbonated beverages (any kind) Strained chicken noodle soup Hard Candy                             4.  In the morning, mix first dose of MoviPrep solution:    Empty 1 Pouch A and 1 Pouch B into the disposable container    Add lukewarm drinking water to the top line of the container. Mix to dissolve    Refrigerate (mixed solution should be used within 24 hrs)  5.  Begin drinking the prep at 5:00 p.m. The MoviPrep container is divided by 4 marks.   Every 15 minutes drink the solution down to the next mark (approximately 8 oz) until the full liter is complete.   6.  Follow completed prep with 16 oz of clear liquid of your choice  (Nothing red or purple).  Continue to drink clear liquids until bedtime.  7.  Before going to bed, mix second dose of MoviPrep solution:    Empty 1 Pouch A and 1 Pouch B into the disposable container    Add lukewarm drinking water to the top line of the container. Mix to dissolve    Refrigerate  THE DAY OF YOUR PROCEDURE      DATE: 02-13-10  DAY: Friday  Beginning at  6:00 a.m. (5 hours before procedure):         1. Every 15 minutes, drink the solution down to the next mark (approx 8 oz) until the full liter is complete.  2. Follow completed prep with 16 oz. of clear liquid of your choice.    3. You may drink clear liquids until 9:00 a.m.  (2 HOURS BEFORE PROCEDURE).   MEDICATION INSTRUCTIONS  Unless otherwise instructed, you should take regular prescription medications with a small sip of water   as early as possible  the morning of your procedure.  Diabetic patients - see separate instructions.         OTHER INSTRUCTIONS  You will need a responsible adult at least 59 years of age to accompany you and drive you home.   This person must remain in the waiting room during your procedure.  Wear loose fitting clothing that is easily removed.  Leave jewelry and other valuables at home.  However, you may wish to bring a book to read or  an iPod/MP3 player to listen to music as you wait for your procedure to start.  Remove all body piercing jewelry and leave at home.  Total time from sign-in until discharge is approximately 2-3 hours.  You should go home directly after your procedure and rest.  You can resume normal activities the  day after your procedure.  The day of your procedure you should not:   Drive   Make legal decisions   Operate machinery   Drink alcohol   Return to work  You will receive specific instructions about eating, activities and medications before you leave.    The above instructions have been reviewed and explained to me by  Karl Bales RN  February 04, 2010 4:59 PM     I fully understand and can verbalize these instructions _____________________________ Date _________   Appended Document: Moviprep Instructions Moviprep instructions were given on 02-02-10 at pt's previsit.  Prep instructions re-entered into into EMR

## 2010-09-29 NOTE — Miscellaneous (Signed)
Summary: [facility] previsit  Clinical Lists Changes  Medications: Added new medication of MOVIPREP 100 GM  SOLR (PEG-KCL-NACL-NASULF-NA ASC-C) As per prep instructions. - Signed Rx of MOVIPREP 100 GM  SOLR (PEG-KCL-NACL-NASULF-NA ASC-C) As per prep instructions.;  #1 x 0;  Signed;  Entered by: Karl Bales RN;  Authorized by: Rachael Fee MD;  Method used: [facility] to [facility] [phone]*, [address] [place], [place], [place]  [address], Ph: [phone], Fax: [phone]    Prescriptions: MOVIPREP 100 GM  SOLR (PEG-KCL-NACL-NASULF-NA ASC-C) As per prep instructions.  #1 x 0   Entered by:   Karl Bales RN   Authorized by:   Rachael Fee MD   Signed by:   Karl Bales RN on 02/02/2010   Method used:   [facility]ronically to        [facility] [phone]* (retail)       [address]       [place], [place]  [address]       Ph: [phone]       Fax: [phone]   RxID:   [id number]

## 2010-09-29 NOTE — Letter (Signed)
Summary: Diabetic Instructions  [facility]  [address]   [place], [place] [address]   Phone: [phone]  Fax: [phone]    Jimmy [name] 10/08/1950 MRN: [id number]   x    ORAL DIABETIC MEDICATION INSTRUCTIONS  The day before your procedure:   Take your diabetic pill as you do normally  The day of your procedure:   Do not take your diabetic pill    We will check your blood sugar levels during the admission process and again in Recovery before discharging you home  ________________________________________________________________________

## 2010-09-29 NOTE — Letter (Signed)
Summary: Unable To Reach-Consult Scheduled  [facility]  [address] [place], [place] [address]   Phone: [phone]  Fax: [phone]    07/10/2010 MRN: [id number]    Dear Jimmy [name],   We have been unable to reach you by phone.  Please contact our office with an updated phone number.      Thank you,   at [employer]

## 2010-09-29 NOTE — Progress Notes (Signed)
Summary: needs that repeated BMP  Phone Note Outgoing Call   Summary of Call: he is due for a repeat BMP, see last BMP. Please arrange if possible for tomorrow Jose E. Paz MD  February 25, 2010 2:52 PM   Follow-up for Phone Call        left message for pt to call back. Army Fossa CMA  February 25, 2010 2:58 PM   Additional Follow-up for Phone Call Additional follow up Details #1::        Pt is coming at 9am today. Army Fossa CMA  February 26, 2010 8:14 AM

## 2010-09-29 NOTE — Procedures (Signed)
Summary: Colonoscopy  Patient: Jimmy [name] Note: All result statuses are Final unless otherwise noted.  Tests: (1) Colonoscopy (COL)   COL Colonoscopy           DONE     [facility]     [address]     [place], [place]  [address]           COLONOSCOPY PROCEDURE REPORT           PATIENT:  Jimmy, [name]  MR#:  [id number]     BIRTHDATE:  1950-11-10, 59 yrs. old  GENDER:  male     ENDOSCOPIST:  Rachael Fee, MD     REF. BY:  Willow Ora, M.D.     PROCEDURE DATE:  02/13/2010     PROCEDURE:  Colonoscopy with snare polypectomy     ASA CLASS:  Class II     INDICATIONS:  Routine Risk Screening     MEDICATIONS:   Fentanyl 75 mcg IV, Versed 8 mg IV     DESCRIPTION OF PROCEDURE:   After the risks benefits and     alternatives of the procedure were thoroughly explained, informed     consent was obtained.  Digital rectal exam was performed and     revealed no rectal masses.   The LB [id number] endoscope     was introduced through the anus and advanced to the cecum, which     was identified by both the appendix and ileocecal valve, without     limitations.  The quality of the prep was good, using MoviPrep.     The instrument was then slowly withdrawn as the colon was fully     examined.     <<PROCEDUREIMAGES>>     FINDINGS:  Mild diverticulosis was found in the sigmoid to     descending colon segments (see image1).  A sessile polyp was     found. This was 3-29mm across, located in transverse colon, removed     with cold snare and sent to pathology (jar 1) (see image5).  This     was otherwise a normal examination of the colon (see image4,     image3, and image7).   Retroflexed views in the rectum revealed no     abnormalities.    The scope was then withdrawn from the patient     and the procedure completed.           COMPLICATIONS:  None           ENDOSCOPIC IMPRESSION:     1) Mild diverticulosis in the sigmoid to descending colon     segments     2) Small sessile polyp;  removed and sent to pathology     3) Otherwise normal examination           RECOMMENDATIONS:     1) If the polyp(s) removed today are proven to be adenomatous     (pre-cancerous) polyps, you will need a repeat colonoscopy in 5     years. Otherwise you should continue to follow colorectal cancer     screening guidelines for "routine risk" patients with colonoscopy     in 10 years.     2) You will receive a letter within 1-2 weeks with the results     of your biopsy as well as final recommendations. Please call my     office if you have not received a letter after 3 weeks.  ______________________________     Rachael Fee, MD           n.     eSIGNED:   Rachael Fee at 02/13/2010 10:48 AM           Shirley Muscat, [id number]  Note: An exclamation mark (!) indicates a result that was not dispersed into the flowsheet. Document Creation Date: 02/13/2010 10:48 AM _______________________________________________________________________  (1) Order result status: Final Collection or observation date-time: 02/13/2010 10:43 Requested date-time:  Receipt date-time:  Reported date-time:  Referring Physician:   Ordering Physician: Rob Bunting [phone]) Specimen Source:  Source: Launa Grill Order Number: [id number] Lab site:   Appended Document: Colonoscopy     Procedures Next Due Date:    Colonoscopy: 01/2015

## 2010-10-01 NOTE — Letter (Signed)
Summary: [facility]--- [facility]   Imported By: Lanelle Bal 09/16/2010 12:31:04  _____________________________________________________________________  External Attachment:    Type:   Image     Comment:   External Document

## 2010-11-06 ENCOUNTER — Ambulatory Visit: Payer: Self-pay | Admitting: Internal Medicine

## 2010-11-13 ENCOUNTER — Encounter: Payer: Self-pay | Admitting: Internal Medicine

## 2010-11-13 ENCOUNTER — Ambulatory Visit (INDEPENDENT_AMBULATORY_CARE_PROVIDER_SITE_OTHER): Payer: BC Managed Care – PPO | Admitting: Internal Medicine

## 2010-11-13 ENCOUNTER — Other Ambulatory Visit: Payer: Self-pay | Admitting: Internal Medicine

## 2010-11-13 DIAGNOSIS — N259 Disorder resulting from impaired renal tubular function, unspecified: Secondary | ICD-10-CM

## 2010-11-13 DIAGNOSIS — E785 Hyperlipidemia, unspecified: Secondary | ICD-10-CM

## 2010-11-13 DIAGNOSIS — F528 Other sexual dysfunction not due to a substance or known physiological condition: Secondary | ICD-10-CM

## 2010-11-13 DIAGNOSIS — I10 Essential (primary) hypertension: Secondary | ICD-10-CM

## 2010-11-13 DIAGNOSIS — E119 Type 2 diabetes mellitus without complications: Secondary | ICD-10-CM

## 2010-11-13 LAB — LIPID PANEL
Cholesterol: 203 mg/dL — ABNORMAL HIGH (ref 0–200)
Total CHOL/HDL Ratio: 5
Triglycerides: 281 mg/dL — ABNORMAL HIGH (ref 0.0–149.0)
VLDL: 56.2 mg/dL — ABNORMAL HIGH (ref 0.0–40.0)

## 2010-11-13 LAB — HEPATIC FUNCTION PANEL: Total Bilirubin: 1.1 mg/dL (ref 0.3–1.2)

## 2010-11-15 LAB — GLUCOSE, CAPILLARY: Glucose-Capillary: 118 mg/dL — ABNORMAL HIGH (ref 70–99)

## 2010-11-17 NOTE — Assessment & Plan Note (Signed)
Summary: rto 4 months/cbs   Vital Signs:  Patient profile:   60 year old male Weight:      226.50 pounds Pulse rate:   65 / minute Pulse rhythm:   regular BP sitting:   134 / 76  (left arm) Cuff size:   large  Vitals Entered By: Army Fossa CMA (November 13, 2010 8:49 AM) CC: 4 month f/u- fsating  Comments discuss skin issues rx for cialis? [facility]    History of Present Illness: ROV  ED-- Cialis 15-20 mg worked ok . Needs a refill, no side effects HYPERTENSION  -- nephrology increase amlodipine from 5 TO 10 mg. His BPs dropped from the 150s to the 130s. Doing well DIABETES -- ambulatory CBGs in the 150-180. unable to exercise due to his work schedule. Diet has not changed Renal insufficiency-- note from nephrology to reviewed , they increase amlodipine   Current Medications (verified): 1)  Losartan Potassium 100 Mg Tabs (Losartan Potassium) .Marland Kitchen.. 1 By Mouth Once Daily. Needs Office Visit. 2)  Norvasc 10 Mg  Tabs (Amlodipine Besylate) .Marland Kitchen.. 1 By Mouth Qd 3)  Metoprolol Tartrate 50 Mg  Tabs (Metoprolol Tartrate) .Marland Kitchen.. 1 Two Times A Day 4)  Bayer Childrens Aspirin 81 Mg  Chew (Aspirin) .Marland Kitchen.. 1 By Mouth Once Daily 5)  Metformin Hcl 1000 Mg  Tabs (Metformin Hcl) .... Bid 6)  Actos 30 Mg  Tabs (Pioglitazone Hcl) .Marland Kitchen.. 1 By Mouth Qd 7)  Tylenol Pm Extra Strength 500-25 Mg Tabs (Diphenhydramine-Apap (Sleep)) .... 2-4 Daily 8)  Vitamin C, Centrum Silver, Fish Oil, Vitamin D3 100 I.u. 9)  Glucosamine 10)  Garlic Pill  Allergies (verified): 1)  ! Diovan 2)  Benazepril Hcl (Benazepril Hcl)  Past History:  Past Medical History: HYPERTENSION   DIABETES MELLITUS, TYPE II   OBESITY NOS  PLANTAR FASCIITIS, BILATERAL  Renal insufficiency  Past Surgical History: Reviewed history from 09/19/2008 and no changes required. pilonidal cyst surgery  Social History: Reviewed history from 09/26/2009 and no changes required. 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 GI:  Denies bloody stools, diarrhea, nausea, and vomiting. Endo:  no low sugar symptoms no burning feeling in the feet.  Physical Exam  General:  alert, well-developed, and overweight-appearing.   Lungs:  normal respiratory effort, no intercostal retractions, no accessory muscle use, and normal breath sounds.   Heart:  normal rate, regular rhythm, no murmur, and no gallop.   Extremities:  no pretibial edema bilaterally   skin is moderately dry   Impression & Recommendations:  Problem # 1:  ERECTILE DYSFUNCTION, NON-ORGANIC (ICD-302.72) cialis worked for him. Prescription provided His updated medication list for this problem includes:    Cialis 20 Mg Tabs (Tadalafil) .Marland Kitchen... 1 by mouth once daily as needed  Problem # 2:  RENAL INSUFFICIENCY (ICD-588.9)  stable  Problem # 3:  HYPERTENSION (ICD-401.9)  nephrology increased amlodipine dose to 10 mg. Stable, no edema His updated medication list for this problem includes:    Losartan Potassium 100 Mg Tabs (Losartan potassium) .Marland Kitchen... 1 by mouth once daily. needs office visit.    Norvasc 10 Mg Tabs (Amlodipine besylate) .Marland Kitchen... 1 by mouth qd    Metoprolol Tartrate 50 Mg Tabs (Metoprolol tartrate) .Marland Kitchen... 1 two times a day  BP today: 134/76 Prior BP: 156/90 (07/03/2010)  Labs Reviewed: K+:  4.6 (07/03/2010) Creat: : 1.6 (07/03/2010)   Chol: 170 (09/26/2009)   HDL: 44.00 (09/26/2009)   LDL: 97 (09/19/2008)   TG: 201.0 (09/26/2009)  Problem # 4:  DIABETES MELLITUS, TYPE II (ICD-250.00)  discussed diet exercise, information provided about diet. 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 1 by mouth once daily. needs office  visit.    Bayer Childrens Aspirin 81 Mg Chew (Aspirin) .Marland Kitchen... 1 by mouth once daily    Metformin Hcl 1000 Mg Tabs (Metformin hcl) ..... Bid    Actos 30 Mg Tabs (Pioglitazone hcl) .Marland Kitchen... 1 by mouth qd  Orders: Venipuncture ([address]) TLB-Hepatic/Liver Function Pnl (80076-HEPATIC) TLB-Lipid Panel (80061-LIPID) TLB-A1C / Hgb A1C (Glycohemoglobin) (83036-A1C) Specimen Handling ([address])  Labs Reviewed: Creat: 1.6 (07/03/2010)     Last Eye Exam: normal (03/17/2009) Reviewed HgBA1c results: 7.0 (07/03/2010)  6.5 (01/30/2010)  Complete Medication List: 1)  Losartan Potassium 100 Mg Tabs (Losartan potassium) .Marland Kitchen.. 1 by mouth once daily. needs office visit. 2)  Norvasc 10 Mg Tabs (Amlodipine besylate) .Marland Kitchen.. 1 by mouth qd 3)  Metoprolol Tartrate 50 Mg Tabs (Metoprolol tartrate) .Marland Kitchen.. 1 two times a day 4)  Bayer Childrens Aspirin 81 Mg Chew (Aspirin) .Marland Kitchen.. 1 by mouth once daily 5)  Metformin Hcl 1000 Mg Tabs (Metformin hcl) .... Bid 6)  Actos 30 Mg Tabs (Pioglitazone hcl) .Marland Kitchen.. 1 by mouth qd 7)  Tylenol Pm Extra Strength 500-25 Mg Tabs (Diphenhydramine-apap (sleep)) .... 2-4 daily 8)  Vitamin C, Centrum Silver, Fish Oil, Vitamin D3 100 I.u.  9)  Glucosamine  10)  Garlic Pill  11)  Cialis 20 Mg Tabs (Tadalafil) .Marland Kitchen.. 1 by mouth once daily as needed  Patient Instructions: 1)  Please schedule a follow-up appointment in 4 months .  Prescriptions: CIALIS 20 MG TABS (TADALAFIL) 1 by mouth once daily as needed  #10 x 3   Entered and Authorized by:   Elita Quick E. Xerxes Agrusa MD   Signed by:   Nolon Rod. Makaveli Hoard MD on 11/13/2010   Method used:   Print then Give to Patient   RxID:   [id number]    Orders Added: 1)  Venipuncture [308[address]] 2)  TLB-Hepatic/Liver Function Pnl [80076-HEPATIC] 3)  TLB-Lipid Panel [80061-LIPID] 4)  TLB-A1C / Hgb A1C (Glycohemoglobin) [83036-A1C] 5)  Specimen Handling [99000] 6)  Est. Patient Level IV [78469]

## 2010-11-20 ENCOUNTER — Encounter: Payer: Self-pay | Admitting: *Deleted

## 2010-11-26 NOTE — Letter (Signed)
Summary: Unable To Reach-Consult Scheduled  [facility]  [address] [place], [place] [address]   Phone: [phone]  Fax: [phone]    11/20/2010 MRN: [id number]    Dear [name],   We have been unable to reach you by phone.  Please contact our office with an updated phone number.   If you have any question please call us.     Thank you,  Army Fossa CMA  November 20, 2010 1:34 PM

## 2010-12-19 ENCOUNTER — Other Ambulatory Visit: Payer: Self-pay | Admitting: Internal Medicine

## 2011-01-15 NOTE — Assessment & Plan Note (Signed)
Mr. Aguiniga is back regarding his right ankle pain.  I last saw Deylan in April  and diagnosed Achilles tendinitis and right leg length discrepancy.  We  advised him to begin a trial of Relafen, as well as needing outpatient  therapy to address range of motion and modalities.  He has been fitted for  right shoe inserts, but has not received these officially.  He was given a  prescription for a night splint to use at night time by Dr. Drue Novel, which  seemed to help, as well.  The patient notes an improvement in his symptoms.  He has had better flexibility and exercise tolerance.  He is working out  regularly at [employer] and is doing about a mile and a half warm up and then  lifting weights, following this up with a bike ride for 20-30 minutes.  He  notes the pain is more intermittent and dull.  It is down to a 3-4/10.  The  pain interferes with general activity, relationships with others and  enjoyment of life only on a mild level.  Sleep is fair.  The pain still  increases somewhat when his flexibility decreases.  He has noted that the  stretching of the heel cord at night with the night splint has been  beneficial.   REVIEW OF SYSTEMS:  The patient denies any bowel, bladder, neurological,  psychiatric, constitutional, GU, GI or cardiorespiratory complaints today.   SOCIAL HISTORY:  Without significant change.   PHYSICAL EXAMINATION:  VITAL SIGNS:  Blood pressure is 158/71.  Repeat was  130/75.  Pulse of 66, respiratory rate 16, satting 97% on room air.  GENERAL:  The patient is pleasant, in no acute distress.  He is alert and  oriented x3.  Affect is bright and stable.  He still walks with a slight  limp to the right side.  He has a mild elevation of the left hemipelvis of  approximately 0.5 inch.  Right ankle range of motion was increased  significantly, and he had good passive ankle dorsiflexion today.  Active  movement was good.  He had no pain with ankle plantar flexion, dorsiflexion,  eversion, inversion.  No swelling was seen.  He did have some pain at the  insertion of the Achilles tendon on the calcaneus, but this was much  decreased from the last visit.  Motor function is 5/5.  Sensory exam is  normal.  Weight is stable.  Coordination is fair.  Cognitively, he is  appropriate.   ASSESSMENT:  1.  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  2.  Right leg length discrepancy of approximately 0.5 inch or less.  3.  History of plantar fasciitis.  4.  Obesity.   PLAN:  1.  Continue with aggressive range of motion and exercise program.  I would      like to see him slowly increase his exercise load as his tendon      tolerates.  I think the night splint was a great idea.  He is to      continue this as tolerated or until he has more regular flexibility in      the ankle.  2.  Await custom shoe insert to account for the leg length discrepancy.      This, hopefully, will take some load off the Achilles tendon.  3.  I will see the patient back in about 3 months time.  I did advise him to  taper off the Relafen and will use as needed for now.  4.  Could consider a steroid injection at the Achilles tendon if symptoms do      not improve further, but I would expect them to continue to respond to      the current measures.     Ranelle Oyster, M.D.  Electronically Signed    ZTS/MedQ  D:  02/01/2006 16:20:41  T:  02/02/2006 10:34:33  Job #:  [id number]

## 2011-02-11 ENCOUNTER — Other Ambulatory Visit: Payer: Self-pay | Admitting: Internal Medicine

## 2011-02-13 ENCOUNTER — Other Ambulatory Visit: Payer: Self-pay | Admitting: Internal Medicine

## 2011-03-12 ENCOUNTER — Other Ambulatory Visit: Payer: Self-pay | Admitting: Internal Medicine

## 2011-03-15 ENCOUNTER — Ambulatory Visit (INDEPENDENT_AMBULATORY_CARE_PROVIDER_SITE_OTHER): Payer: BC Managed Care – PPO | Admitting: Internal Medicine

## 2011-03-15 ENCOUNTER — Encounter: Payer: Self-pay | Admitting: Internal Medicine

## 2011-03-15 DIAGNOSIS — K5900 Constipation, unspecified: Secondary | ICD-10-CM | POA: Insufficient documentation

## 2011-03-15 DIAGNOSIS — I10 Essential (primary) hypertension: Secondary | ICD-10-CM

## 2011-03-15 DIAGNOSIS — R609 Edema, unspecified: Secondary | ICD-10-CM | POA: Insufficient documentation

## 2011-03-15 DIAGNOSIS — N259 Disorder resulting from impaired renal tubular function, unspecified: Secondary | ICD-10-CM

## 2011-03-15 DIAGNOSIS — E669 Obesity, unspecified: Secondary | ICD-10-CM

## 2011-03-15 DIAGNOSIS — E119 Type 2 diabetes mellitus without complications: Secondary | ICD-10-CM

## 2011-03-15 DIAGNOSIS — E785 Hyperlipidemia, unspecified: Secondary | ICD-10-CM

## 2011-03-15 LAB — CBC WITH DIFFERENTIAL/PLATELET
Basophils Absolute: 0.1 10*3/uL (ref 0.0–0.1)
Basophils Relative: 0.8 % (ref 0.0–3.0)
Eosinophils Absolute: 0.4 10*3/uL (ref 0.0–0.7)
Hemoglobin: 14 g/dL (ref 13.0–17.0)
MCHC: 34.3 g/dL (ref 30.0–36.0)
MCV: 95.7 fl (ref 78.0–100.0)
Monocytes Absolute: 0.6 10*3/uL (ref 0.1–1.0)
Neutro Abs: 4.1 10*3/uL (ref 1.4–7.7)
Neutrophils Relative %: 61.2 % (ref 43.0–77.0)
RBC: 4.28 Mil/uL (ref 4.22–5.81)
RDW: 13.8 % (ref 11.5–14.6)

## 2011-03-15 LAB — BASIC METABOLIC PANEL
CO2: 25 mEq/L (ref 19–32)
Calcium: 9.2 mg/dL (ref 8.4–10.5)
Chloride: 100 mEq/L (ref 96–112)
Creatinine, Ser: 1.8 mg/dL — ABNORMAL HIGH (ref 0.4–1.5)
Glucose, Bld: 118 mg/dL — ABNORMAL HIGH (ref 70–99)

## 2011-03-15 LAB — LIPID PANEL: Total CHOL/HDL Ratio: 4

## 2011-03-15 LAB — LDL CHOLESTEROL, DIRECT: Direct LDL: 119.4 mg/dL

## 2011-03-15 LAB — HEMOGLOBIN A1C: Hgb A1c MFr Bld: 7 % — ABNORMAL HIGH (ref 4.6–6.5)

## 2011-03-15 MED ORDER — LOSARTAN POTASSIUM 100 MG PO TABS: 100.0000 mg | ORAL_TABLET | Freq: Every day | ORAL | Status: DC

## 2011-03-15 MED ORDER — GLIMEPIRIDE 1 MG PO TABS: 1.0000 mg | ORAL_TABLET | Freq: Every day | ORAL | Status: DC

## 2011-03-15 NOTE — Assessment & Plan Note (Signed)
Last LDL 119,  goal less than 100. Plan: Labs, has never taken any medication for cholesterol. Statins?

## 2011-03-15 NOTE — Assessment & Plan Note (Signed)
Check a BMP

## 2011-03-15 NOTE — Patient Instructions (Addendum)
To prevent constipation, Metamucil 1 capsule twice a day and drink plenty of fluids Check the  blood pressure 2 or 3 times a week, be sure it is less than 140/85. If it is consistently higher, let me know

## 2011-03-15 NOTE — Progress Notes (Signed)
  Subjective:    Patient ID: Jimmy [name], male    DOB: Feb 24, 1951, 60 y.o.   MRN: [id number]  HPI Diabetes, was seen 4 months ago, he was unable to exercise at the time, A1c was elevated at 8.5. We added amaryl. Since May, he is doing better, exercise more frequently. Blood sugars have drove from the 170s to the 150s.Few times while exercising he has feel slightly dizzy and weak but able to finish his routine, he suspected low blood sugars but has not checked. See review of systems.  Hypertension: Self decrease metoprolol to half tablet twice a day because otherwise he feels extremely tired. 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 Blood in the stools--2 or 3 times a month he has had very dry, hard the stool, it occurs during the bowel movement and see drops of blood when he wipes.  Past Medical History  Diagnosis Date  . Hypertension   . Diabetes mellitus   . Obesity   . Plantar fasciitis   . Renal insufficiency    Past Surgical History  Procedure Date  . 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 No dyspnea on exertion. No orthopnea. No near syncope. No Ear pain, d/c,  Some tinnitus (rarely) no Nausea, vomiting, diarrhea, abdominal pain; no bloody stools     Objective:   Physical Exam  Constitutional: He is oriented to person, place, and time. He appears well-developed.       Overweight appearing  Cardiovascular: Normal rate, regular rhythm and normal heart sounds.   No murmur heard. Pulmonary/Chest: Effort normal and breath sounds normal. No respiratory distress. He has no wheezes. He has no rales.  Abdominal: Soft. Bowel sounds are normal. He  exhibits no distension. There is no tenderness. There is no rebound and no guarding.  Musculoskeletal:       +/+++ Pretibial edema bilaterally and symmetric  Neurological: He is alert and oriented to person, place, and time.  Psychiatric: He has a normal mood and affect. His behavior is normal. Judgment and thought content normal.          Assessment & Plan:  See HPI----> ET dysfunction?? Cerumen impactation??: ear cleaned today, re asses on RTC

## 2011-03-15 NOTE — Assessment & Plan Note (Signed)
Has gained few pounds, diet-exercise!

## 2011-03-15 NOTE — Assessment & Plan Note (Addendum)
Intolerant to 50 mg of metoprolol a day, currently at 25. Ambulatory blood pressures okay. No change as long as BPs wnl

## 2011-03-15 NOTE — Assessment & Plan Note (Addendum)
Diabetes poorly controlled based on the last A1c, and since then he has more time and is exercising more. We also added Amaryl. Occ.  feels weak dizzy while exercising but no chest pain, shortness of breath no near fainting. He wonders about low blood sugars. EKG today normal sinus rhythm, unchanged from previous  Plan: A1c Check CBGs whenever he feels weak or dizzy

## 2011-03-15 NOTE — Assessment & Plan Note (Addendum)
Edema noted today, he is on both Actos and amlodipine which may promote edema . Will watch, low salt diet encouraged

## 2011-03-15 NOTE — Assessment & Plan Note (Addendum)
Occasional constipation, sees drops of blood when he wipes. never had a colonoscopy Recommend Metamucil. If he has anemia, will need further workup

## 2011-03-18 ENCOUNTER — Telehealth: Payer: Self-pay | Admitting: *Deleted

## 2011-03-18 NOTE — Telephone Encounter (Signed)
Message copied by Leanne Lovely on Thu Mar 18, 2011 11:55 AM ------      Message from: Willow Ora E      Created: Thu Mar 18, 2011 11:33 AM       Advise patient column      Diabetes has improved, continue with the same meds, diet and exercise!      Cholesterol needs improvement, LDL still elevated, recommend Lipitor 10 mg every night.      Kidney function is stable      F/u fasting as planned (3 months)

## 2011-03-18 NOTE — Telephone Encounter (Signed)
Message left for patient to return my call.  

## 2011-03-19 NOTE — Telephone Encounter (Signed)
Message left for patient to return my call.  

## 2011-03-22 NOTE — Telephone Encounter (Signed)
Message left for patient to return my call.  

## 2011-03-23 ENCOUNTER — Encounter: Payer: Self-pay | Admitting: *Deleted

## 2011-03-23 MED ORDER — ATORVASTATIN CALCIUM 10 MG PO TABS: 10.0000 mg | ORAL_TABLET | Freq: Every day | ORAL | Status: DC

## 2011-03-23 NOTE — Telephone Encounter (Signed)
Will mail pt a letter copy of labs and rx for lipitor.

## 2011-04-11 ENCOUNTER — Other Ambulatory Visit: Payer: Self-pay | Admitting: Internal Medicine

## 2011-04-12 NOTE — Telephone Encounter (Signed)
Rx Done . 

## 2011-04-17 ENCOUNTER — Other Ambulatory Visit: Payer: Self-pay | Admitting: Internal Medicine

## 2011-04-19 NOTE — Telephone Encounter (Signed)
Rx Done . 

## 2011-04-25 ENCOUNTER — Other Ambulatory Visit: Payer: Self-pay | Admitting: Internal Medicine

## 2011-06-16 ENCOUNTER — Encounter: Payer: Self-pay | Admitting: Internal Medicine

## 2011-06-17 ENCOUNTER — Ambulatory Visit (INDEPENDENT_AMBULATORY_CARE_PROVIDER_SITE_OTHER): Payer: BC Managed Care – PPO | Admitting: Internal Medicine

## 2011-06-17 ENCOUNTER — Encounter: Payer: Self-pay | Admitting: Internal Medicine

## 2011-06-17 VITALS — BP 136/78 | HR 63 | Temp 97.7°F | Resp 18 | Wt 332.2 lb

## 2011-06-17 DIAGNOSIS — E785 Hyperlipidemia, unspecified: Secondary | ICD-10-CM

## 2011-06-17 DIAGNOSIS — E119 Type 2 diabetes mellitus without complications: Secondary | ICD-10-CM

## 2011-06-17 DIAGNOSIS — I10 Essential (primary) hypertension: Secondary | ICD-10-CM

## 2011-06-17 DIAGNOSIS — Z23 Encounter for immunization: Secondary | ICD-10-CM

## 2011-06-17 NOTE — Assessment & Plan Note (Signed)
Based on the last cholesterol panel, we added Lipitor,   will come back in 3 weeks fasting for labs

## 2011-06-17 NOTE — Assessment & Plan Note (Addendum)
Was not as well controlled as desired, patient self increase metoprolol to 50 mg daily, nephrology recommended to add HZTZ to his regimen, will start tomorrow thus will defer labs to 3 weeks from now  Plan: BMP in 3 weeks, will fax to nephrology.

## 2011-06-17 NOTE — Patient Instructions (Addendum)
Followup in 4 months Will come back fasting in 3 weeks: BMP ----dx hypertension F LP, AST, ALT----dx High cholesterol A1c-----dx diabetes

## 2011-06-17 NOTE — Assessment & Plan Note (Addendum)
Last hemoglobin A1c 7.0. Will come back in 3 weeks to do blood work.. Flu shot today

## 2011-06-17 NOTE — Progress Notes (Signed)
  Subjective:    Patient ID: Jimmy [name], male    DOB: Jul 18, 1951, 60 y.o.   MRN: [id number]  HPI Routine office visit Diabetes, good medication compliance, blood sugars are around 130 in the morning. Hypertension, BP was slightly elevated, he self increase metoprolol to 1 a day. He saw a nephrologist yesterday and they recommended add HCTZ. High cholesterol, good compliance with medication. Diet is about the same, due to his schedule he has not been able to exercise lately  Past Medical History  Diagnosis Date  . Hypertension   . Diabetes mellitus   . Obesity   . Plantar fasciitis   . Renal insufficiency    Past Surgical History  Procedure Date  . Pilonidal cyst surgery      Review of Systems Denies chest pain or shortness or breath No lower extremity edema No nausea, vomiting, diarrhea     Objective:   Physical Exam  Constitutional: He is oriented to person, place, and time. He appears well-developed.       Overweight appearing  Cardiovascular: Normal rate, regular rhythm and normal heart sounds.   No murmur heard. Pulmonary/Chest: Effort normal and breath sounds normal. No respiratory distress. He has no wheezes. He has no rales.  Musculoskeletal: He exhibits no edema.  Neurological: He is alert and oriented to person, place, and time.  Psychiatric: He has a normal mood and affect. His behavior is normal. Judgment and thought content normal.          Assessment & Plan:  Visit took place while the computers were down, documentation was done after the patient was gone

## 2011-06-26 ENCOUNTER — Other Ambulatory Visit: Payer: Self-pay | Admitting: Internal Medicine

## 2011-07-07 ENCOUNTER — Other Ambulatory Visit: Payer: Self-pay | Admitting: Internal Medicine

## 2011-07-07 DIAGNOSIS — E119 Type 2 diabetes mellitus without complications: Secondary | ICD-10-CM

## 2011-07-07 DIAGNOSIS — I10 Essential (primary) hypertension: Secondary | ICD-10-CM

## 2011-07-07 DIAGNOSIS — E785 Hyperlipidemia, unspecified: Secondary | ICD-10-CM

## 2011-07-08 ENCOUNTER — Other Ambulatory Visit (INDEPENDENT_AMBULATORY_CARE_PROVIDER_SITE_OTHER): Payer: BC Managed Care – PPO

## 2011-07-08 DIAGNOSIS — E119 Type 2 diabetes mellitus without complications: Secondary | ICD-10-CM

## 2011-07-08 DIAGNOSIS — E785 Hyperlipidemia, unspecified: Secondary | ICD-10-CM

## 2011-07-08 DIAGNOSIS — I10 Essential (primary) hypertension: Secondary | ICD-10-CM

## 2011-07-08 LAB — LIPID PANEL
Cholesterol: 122 mg/dL (ref 0–200)
LDL Cholesterol: 38 mg/dL (ref 0–99)
Triglycerides: 185 mg/dL — ABNORMAL HIGH (ref 0.0–149.0)
VLDL: 37 mg/dL (ref 0.0–40.0)

## 2011-07-08 LAB — HEMOGLOBIN A1C: Hgb A1c MFr Bld: 6.6 % — ABNORMAL HIGH (ref 4.6–6.5)

## 2011-07-08 LAB — BASIC METABOLIC PANEL
Chloride: 103 mEq/L (ref 96–112)
Creatinine, Ser: 1.8 mg/dL — ABNORMAL HIGH (ref 0.4–1.5)
Potassium: 4.4 mEq/L (ref 3.5–5.1)

## 2011-07-08 NOTE — Progress Notes (Signed)
12  

## 2011-07-13 ENCOUNTER — Telehealth: Payer: Self-pay

## 2011-07-13 NOTE — Telephone Encounter (Signed)
Message copied by Francisco Capuchin on Tue Jul 13, 2011 12:09 PM ------      Message from: Wanda Plump      Created: Tue Jul 13, 2011  9:03 AM       Advise patient:      Kidney function stable, continue w/HCTZ, fax all results to his nephrologist      Diabetes  is under  Good control       Cholesterol has definitely improved with Lipitor.      Continue with same medicines, good  results

## 2011-07-13 NOTE — Telephone Encounter (Signed)
Message copied by Francisco Capuchin on Tue Jul 13, 2011 12:27 PM ------      Message from: Wanda Plump      Created: Tue Jul 13, 2011  9:03 AM       Advise patient:      Kidney function stable, continue w/HCTZ, fax all results to his nephrologist      Diabetes  is under  Good control       Cholesterol has definitely improved with Lipitor.      Continue with same medicines, good  results

## 2011-07-13 NOTE — Telephone Encounter (Signed)
Advised patient: Kidney function stable, continue w/HCTZ, faxed all results to his nephrologist Carolynn Comment at [facility] # [phone] Diabetes is under Good control  Cholesterol has definitely improved with Lipitor. Continue with same medicines, good results

## 2011-07-13 NOTE — Telephone Encounter (Signed)
Advised patient: Kidney function stable, continue w/HCTZ, faxed all results to his nephrologist Carolynn Comment at [facility] [phone]  Diabetes is under Good control  Cholesterol has definitely improved with Lipitor. Continue with same medicines, good results

## 2011-08-14 ENCOUNTER — Other Ambulatory Visit: Payer: Self-pay | Admitting: Internal Medicine

## 2011-08-21 ENCOUNTER — Other Ambulatory Visit: Payer: Self-pay | Admitting: Internal Medicine

## 2011-08-31 DIAGNOSIS — C4491 Basal cell carcinoma of skin, unspecified: Secondary | ICD-10-CM

## 2011-08-31 HISTORY — DX: Basal cell carcinoma of skin, unspecified: C44.91

## 2011-09-18 ENCOUNTER — Other Ambulatory Visit: Payer: Self-pay | Admitting: Internal Medicine

## 2011-09-20 NOTE — Telephone Encounter (Signed)
rx sent to pharmacy by e-script  

## 2011-09-25 ENCOUNTER — Other Ambulatory Visit: Payer: Self-pay | Admitting: Internal Medicine

## 2011-09-27 NOTE — Telephone Encounter (Signed)
Refill done.  

## 2011-10-14 ENCOUNTER — Ambulatory Visit (INDEPENDENT_AMBULATORY_CARE_PROVIDER_SITE_OTHER): Payer: BC Managed Care – PPO | Admitting: Internal Medicine

## 2011-10-14 ENCOUNTER — Encounter: Payer: Self-pay | Admitting: Internal Medicine

## 2011-10-14 VITALS — BP 140/78 | HR 68 | Temp 97.7°F | Wt 343.0 lb

## 2011-10-14 DIAGNOSIS — E785 Hyperlipidemia, unspecified: Secondary | ICD-10-CM

## 2011-10-14 DIAGNOSIS — E119 Type 2 diabetes mellitus without complications: Secondary | ICD-10-CM

## 2011-10-14 DIAGNOSIS — R609 Edema, unspecified: Secondary | ICD-10-CM

## 2011-10-14 DIAGNOSIS — I10 Essential (primary) hypertension: Secondary | ICD-10-CM

## 2011-10-14 DIAGNOSIS — K5900 Constipation, unspecified: Secondary | ICD-10-CM

## 2011-10-14 NOTE — Progress Notes (Signed)
  Subjective:    Patient ID: Jimmy [name], male    DOB: 01-Sep-1950, 61 y.o.   MRN: [id number]  HPI Routine office visit, feeling well. Good medication compliance. We review all of his previous labs.  Past medical history Diabetes Hypertension Obesity Renal insufficiency.  Past surgical history Pilonidal cyst surgery   Review of Systems Ambulatory blood pressures within normal,  this morning 134/78. Cholesterol medication not causing any problems like muscle aches. Couple of times had low sugar symptoms, no actual readings, symptoms went away with by eating. Constipation ok as long as he takes fiber     Objective:   Physical Exam  Constitutional: He is oriented to person, place, and time. He appears well-developed. No distress.       overweight  Cardiovascular: Normal rate, regular rhythm and normal heart sounds.   No murmur heard. Pulmonary/Chest: Effort normal and breath sounds normal. No respiratory distress. He has no wheezes. He has no rales.  Musculoskeletal: Edema: trace edema B.  Neurological: He is alert and oriented to person, place, and time.  Skin: He is not diaphoretic.  Psychiatric: He has a normal mood and affect. His behavior is normal. Judgment and thought content normal.      Assessment & Plan:

## 2011-10-14 NOTE — Assessment & Plan Note (Signed)
Well-controlled, no change 

## 2011-10-14 NOTE — Assessment & Plan Note (Signed)
Well-controlled per last FLP 

## 2011-10-14 NOTE — Assessment & Plan Note (Signed)
Ok as long as he takes fiber

## 2011-10-14 NOTE — Assessment & Plan Note (Signed)
Well controlled per last A1C Last Creat 1.8, I'm concerned about use of metformin, will d/c ,

## 2011-10-14 NOTE — Assessment & Plan Note (Signed)
At baseline 

## 2011-10-17 ENCOUNTER — Encounter: Payer: Self-pay | Admitting: Internal Medicine

## 2011-12-18 ENCOUNTER — Other Ambulatory Visit: Payer: Self-pay | Admitting: Internal Medicine

## 2011-12-20 NOTE — Telephone Encounter (Signed)
Refill done.  

## 2012-01-08 ENCOUNTER — Other Ambulatory Visit: Payer: Self-pay | Admitting: Internal Medicine

## 2012-01-10 NOTE — Telephone Encounter (Signed)
Ok to fill 

## 2012-01-13 ENCOUNTER — Encounter: Payer: Self-pay | Admitting: Internal Medicine

## 2012-01-13 ENCOUNTER — Ambulatory Visit (INDEPENDENT_AMBULATORY_CARE_PROVIDER_SITE_OTHER): Payer: BC Managed Care – PPO | Admitting: Internal Medicine

## 2012-01-13 DIAGNOSIS — Z0000 Encounter for general adult medical examination without abnormal findings: Secondary | ICD-10-CM

## 2012-01-13 DIAGNOSIS — E119 Type 2 diabetes mellitus without complications: Secondary | ICD-10-CM

## 2012-01-13 DIAGNOSIS — N259 Disorder resulting from impaired renal tubular function, unspecified: Secondary | ICD-10-CM

## 2012-01-13 DIAGNOSIS — I10 Essential (primary) hypertension: Secondary | ICD-10-CM

## 2012-01-13 LAB — AST: AST: 30 U/L (ref 0–37)

## 2012-01-13 LAB — PSA: PSA: 0.59 ng/mL (ref 0.10–4.00)

## 2012-01-13 LAB — ALT: ALT: 40 U/L (ref 0–53)

## 2012-01-13 LAB — LIPID PANEL
HDL: 42.1 mg/dL (ref >39.00)
Total CHOL/HDL Ratio: 3

## 2012-01-13 NOTE — Assessment & Plan Note (Addendum)
Blood sugar has been slightly elevated lately, we d/c metformin few months ago due to CRI. Check an A1c feet exam normal today except for mild edema and bunion deformity Advised to see an eye doctor yearly. Also complains of lower extremity pain, see review of systems, vascular exam is normal. The pain would be atypical for neuropathy but will keep that in mind.

## 2012-01-13 NOTE — Assessment & Plan Note (Signed)
Well-controlled, no change 

## 2012-01-13 NOTE — Progress Notes (Signed)
  Subjective:    Patient ID: Jimmy [name], male    DOB: 16-Jun-1951, 61 y.o.   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 Ambulatory blood sugars are checked rarely, in the last month morning sugars has been elevated. Ambulatory BPs consistently within normal, pulse in the 60s. Denies chest pain or shortness of breath Denies nausea, vomiting, diarrhea. Occasionally sees drops of red blood with hard stools. No dysuria or gross hematuria. Complains of pain in the legs, pain is describing as discomfort for or burning at the whole leg, triggered by standing or walking.      Objective:   Physical Exam  General -- alert, well-developed, and overweight-appearing.   Neck --no thyromegaly , normal carotid pulse Lungs -- normal respiratory effort, no intercostal retractions, no accessory muscle use, and normal breath sounds.   Heart-- normal rate, regular rhythm, no murmur, and no gallop.   Abdomen--soft, non-tender, no distention, no masses, no HSM, no guarding, and no rigidity.   DIABETIC FEET EXAM: trace lower extremity edema Normal pedal pulses bilaterally, good capillary refills Skin normal, nails slightly thick, few calluses and B bunion deformities Pinprick examination of the feet normal. No varicose veins noted Rectal-- No external abnormalities noted. Normal sphincter tone. No rectal masses or tenderness. Found no stool,  Prostate:  Exam limited the patient's size but seems normal  Neurologic-- alert &  oriented X3 and strength normal in all extremities. Psych-- Cognition and judgment appear intact. Alert and cooperative with normal attention span and concentration.  not anxious appearing and not depressed appearing.       Assessment & Plan:

## 2012-01-13 NOTE — Assessment & Plan Note (Addendum)
Saw nephrology recently, labs were drawn. Will get reports. They typically check a BMP and a CBC.

## 2012-01-13 NOTE — Patient Instructions (Signed)
See an eye doctor every year for a diabetes check

## 2012-01-13 NOTE — Assessment & Plan Note (Signed)
Td 08 pneumonia shot 09/26/09 Colonoscopy 01/2010, tubular adenoma, next in 2016 His weight remains his main medical problem. Again we discussed good nutrition and exercise. Weight [employer]chers? [employer]?

## 2012-01-13 NOTE — Telephone Encounter (Signed)
Pharmacy calling to check status on refill.  Please advise.

## 2012-01-16 NOTE — Progress Notes (Signed)
Addended by: Willow Ora E on: 01/16/2012 10:49 AM   Modules accepted: Orders

## 2012-01-20 ENCOUNTER — Encounter: Payer: Self-pay | Admitting: *Deleted

## 2012-01-20 NOTE — Telephone Encounter (Signed)
12 and 6 RF

## 2012-01-20 NOTE — Telephone Encounter (Signed)
Refill done.  

## 2012-01-21 ENCOUNTER — Other Ambulatory Visit: Payer: Self-pay | Admitting: *Deleted

## 2012-01-21 MED ORDER — GLIMEPIRIDE 1 MG PO TABS: ORAL_TABLET | ORAL | Status: DC

## 2012-02-16 MED ORDER — GLIMEPIRIDE 1 MG PO TABS: ORAL_TABLET | ORAL | Status: DC

## 2012-02-16 NOTE — Addendum Note (Signed)
Addended by: Edwena Felty T on: 02/16/2012 04:37 PM   Modules accepted: Orders

## 2012-03-30 ENCOUNTER — Encounter: Payer: Self-pay | Admitting: *Deleted

## 2012-04-03 ENCOUNTER — Ambulatory Visit (INDEPENDENT_AMBULATORY_CARE_PROVIDER_SITE_OTHER)
Admission: RE | Admit: 2012-04-03 | Discharge: 2012-04-03 | Disposition: A | Discharge status: Home / Self Care | Payer: BC Managed Care – PPO | Source: Ambulatory Visit | Attending: Internal Medicine | Admitting: Internal Medicine

## 2012-04-03 ENCOUNTER — Ambulatory Visit (INDEPENDENT_AMBULATORY_CARE_PROVIDER_SITE_OTHER): Payer: BC Managed Care – PPO | Admitting: Internal Medicine

## 2012-04-03 ENCOUNTER — Encounter: Payer: Self-pay | Admitting: Internal Medicine

## 2012-04-03 VITALS — BP 138/80 | HR 60 | Temp 97.8°F | Wt 332.0 lb

## 2012-04-03 DIAGNOSIS — I10 Essential (primary) hypertension: Secondary | ICD-10-CM

## 2012-04-03 DIAGNOSIS — R0609 Other forms of dyspnea: Secondary | ICD-10-CM

## 2012-04-03 DIAGNOSIS — E119 Type 2 diabetes mellitus without complications: Secondary | ICD-10-CM

## 2012-04-03 DIAGNOSIS — R0989 Other specified symptoms and signs involving the circulatory and respiratory systems: Secondary | ICD-10-CM

## 2012-04-03 DIAGNOSIS — M79609 Pain in unspecified limb: Secondary | ICD-10-CM

## 2012-04-03 DIAGNOSIS — C4491 Basal cell carcinoma of skin, unspecified: Secondary | ICD-10-CM | POA: Insufficient documentation

## 2012-04-03 DIAGNOSIS — L989 Disorder of the skin and subcutaneous tissue, unspecified: Secondary | ICD-10-CM

## 2012-04-03 LAB — CBC WITH DIFFERENTIAL/PLATELET
Basophils Relative: 0.7 % (ref 0.0–3.0)
Eosinophils Relative: 4.3 % (ref 0.0–5.0)
Hemoglobin: 14 g/dL (ref 13.0–17.0)
Lymphocytes Relative: 17.5 % (ref 12.0–46.0)
Monocytes Relative: 7.1 % (ref 3.0–12.0)
Neutro Abs: 6.1 10*3/uL (ref 1.4–7.7)
RBC: 4.31 Mil/uL (ref 4.22–5.81)

## 2012-04-03 LAB — BASIC METABOLIC PANEL
BUN: 34 mg/dL — ABNORMAL HIGH (ref 6–23)
Chloride: 101 mEq/L (ref 96–112)
Creatinine, Ser: 1.9 mg/dL — ABNORMAL HIGH (ref 0.4–1.5)
Glucose, Bld: 118 mg/dL — ABNORMAL HIGH (ref 70–99)

## 2012-04-03 NOTE — Assessment & Plan Note (Signed)
Differential diagnosis includes obesity, deconditioning, angina equivalent, etc. EKG today normal sinus rhythm and at baseline. Plan: CBC, chest x-ray, cardiology referral (angina equivalent?)

## 2012-04-03 NOTE — Assessment & Plan Note (Signed)
Skin lesion in the back for 3 weeks, it bleeds from time to time. The patient recalls having a lesion there in the past as well. Exam is confirmatory, has a 1 cm superficial skin lesion, some neovascularization and borders are slightly elevated. Plan: Refer to dermatology

## 2012-04-03 NOTE — Assessment & Plan Note (Addendum)
Complaining of bilateral leg pain, vascular exam is normal. He occasionally has back pain but is not severe. Etiology unclear. Does not sound like DJD (knee or hip pain per se). Pain does not involve upper extremities. Plan: Check a CK, reassess on return to the office

## 2012-04-03 NOTE — Progress Notes (Signed)
  Subjective:    Patient ID: Jimmy [name], male    DOB: 25-Dec-1950, 61 y.o.   MRN: [id number]  HPI Here to discuss the following issues. Diabetes, his diet has improved, exercising more often, has lost 8 pounds. Ambulatory blood sugars have dropped from the 200s to 140 in the morning fasting. Hypertension, good medication compliance, BP today normal, ambulatory BPs within normal. Complained of dyspnea on exertion, going on for ~ a year, it is not associated with cough, chest pain or wheezing. Denies orthopnea. Also complains of pain in the legs, pain is steady, worse when he stands up, not worsened by exertion, described as a muscle ache ( burning?), not a cramp, arms are not affected. Denies specifically any feet pain-burning-paresthesias. Edema is well-controlled.  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 See history of present illness.     Objective:   Physical Exam  General -- alert, well-developed, and overweight appearing. No apparent distress.  Neck --no JVD at 45 Lungs -- normal respiratory effort, no intercostal retractions, no accessory muscle use, and normal breath sounds.   Heart-- normal rate, regular rhythm, no murmur, and no gallop.   Abdomen--soft, non-tender, no distention, no masses, no HSM, no guarding, and no rigidity.   Extremities-- trace pretibial edema bilaterally ; normal femoral and pedal pulses DIABETIC FEET EXAM: Skin   normal without calluses; nails slightly thick Pinprick examination of the feet normal. Neurologic-- alert & oriented X3 and strength  normal in all extremities. Not tender to palpation at the muscle mass in the lower extremities  Psych-- Cognition and judgment appear intact. Alert and cooperative with normal attention span and concentration.  not anxious appearing and not depressed appearing.      Assessment & Plan:

## 2012-04-03 NOTE — Assessment & Plan Note (Signed)
Doing great with lifestyle, has lost 8 pounds, CBGs are better, we'll get an A1c. Further advice with results. victoza?

## 2012-04-03 NOTE — Assessment & Plan Note (Signed)
Well-controlled, no change 

## 2012-04-03 NOTE — Patient Instructions (Signed)
Please get your x-ray at the other [place]  office located at: [address], across from [facility].  Please go to the basement, this is a walk-in facility, they are open from 8:30 to 5:30 PM. Phone number [phone].  

## 2012-04-06 MED ORDER — GLIMEPIRIDE 4 MG PO TABS: ORAL_TABLET | ORAL | Status: DC

## 2012-04-06 NOTE — Addendum Note (Signed)
Addended by: Edwena Felty T on: 04/06/2012 11:42 AM   Modules accepted: Orders

## 2012-04-09 ENCOUNTER — Other Ambulatory Visit: Payer: Self-pay | Admitting: Internal Medicine

## 2012-04-12 ENCOUNTER — Ambulatory Visit: Payer: BC Managed Care – PPO | Admitting: Internal Medicine

## 2012-04-25 ENCOUNTER — Ambulatory Visit: Payer: BC Managed Care – PPO | Admitting: Cardiovascular Disease

## 2012-04-27 ENCOUNTER — Encounter: Payer: Self-pay | Admitting: Internal Medicine

## 2012-04-27 ENCOUNTER — Ambulatory Visit (INDEPENDENT_AMBULATORY_CARE_PROVIDER_SITE_OTHER): Payer: BC Managed Care – PPO | Admitting: Internal Medicine

## 2012-04-27 VITALS — BP 143/75 | HR 70 | Ht 71.0 in | Wt 306.0 lb

## 2012-04-27 DIAGNOSIS — R0602 Shortness of breath: Secondary | ICD-10-CM

## 2012-04-27 NOTE — Patient Instructions (Signed)
Your physician has requested that you have an echocardiogram. Echocardiography is a painless test that uses sound waves to create images of your heart. It provides your doctor with information about the size and shape of your heart and how well your heart's chambers and valves are working. This procedure takes approximately one hour. There are no restrictions for this procedure.   We will call you with results  

## 2012-04-27 NOTE — Progress Notes (Signed)
HPI Patient is a 61 year old who was referred for evaluation of SOB The patient has no known history of CAD. Has been working out, trying to lose wt.  Has actually seen some small success  ON talking to the patient he says he is able to use a stationary bike an hour without problem.  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  No PND  No wheezing.  No palpitations. Brings in BPs which range from 118 to 142 at rest.  Allergies  Allergen Reactions  . Benazepril Hcl     REACTION: cough  . Valsartan     Current Outpatient Prescriptions  Medication Sig Dispense Refill  . amLODipine (NORVASC) 10 MG tablet Take 10 mg by mouth daily.        . Ascorbic Acid (VITAMIN C) 100 MG tablet Take 100 mg by mouth daily.        Marland Kitchen aspirin 81 MG tablet Take 81 mg by mouth daily.        Marland Kitchen atorvastatin (LIPITOR) 10 MG tablet TAKE 1 TABLET BY MOUTH EVERY DAY  30 tablet  5  . Cholecalciferol (VITAMIN D3) 1000 UNITS CAPS Take by mouth.        . CIALIS 20 MG tablet TAKE 1 TABLET BY MOUTH ONCE DAILY AS NEEDED  10 tablet  6  . diphenhydramine-acetaminophen (TYLENOL PM) 25-500 MG TABS Take 1 tablet by mouth at bedtime as needed.        . fish oil-omega-3 fatty acids 1000 MG capsule Take 2 g by mouth daily.        . Garlic 1250 MG TABS Take 1 tablet by mouth daily.      Marland Kitchen glimepiride (AMARYL) 4 MG tablet Take 1.5 tablets daily  60 tablet  3  . losartan-hydrochlorothiazide (HYZAAR) 100-25 MG per tablet Take 1 tablet by mouth daily.        . metoprolol (LOPRESSOR) 50 MG tablet Take 50 mg by mouth daily.       . Multiple Vitamins-Minerals (CENTRUM SILVER PO) Take by mouth.        . pioglitazone (ACTOS) 30 MG tablet TAKE 1 TABLET BY MOUTH EVERY DAY  30 tablet  5  . DISCONTD: glimepiride (AMARYL) 1 MG tablet TAKE 3 TABLET BY MOUTH DAILY BEFORE BREAKFAST  90 tablet  2  . DISCONTD: metoprolol (LOPRESSOR) 50 MG tablet TAKE 1 TABLET  TWICE A DAY  60 tablet  5    Past Medical History  Diagnosis Date  . Hypertension   . Diabetes mellitus   . Obesity   . Plantar fasciitis   . Renal insufficiency     Past Surgical History  Procedure Date  . Pilonidal cyst surgery     Family History  Problem Relation Age of Onset  . Colon cancer Neg Hx   . Prostate cancer Neg Hx   . Diabetes Neg Hx   . Heart attack      GF 29s y/o  . Cancer Mother   . Kidney failure Father   . Kidney disease Father     History   Social History  . Marital Status: Married    Spouse Name: N/A    Number of Children: 4  . Years of Education: N/A   Occupational History  . Arquitect, teacher @ [employer]     Social History  Main Topics  . Smoking status: Never Smoker   . Smokeless tobacco: Not on file  . Alcohol Use: Yes  . Drug Use: Not on file  . Sexually Active: Not on file   Other Topics Concern  . Not on file   Social History Narrative   Not able to exercise as much due to workNot eating as healthy    Review of Systems:  All systems reviewed.  They are negative to the above problem except as previously stated.  Vital Signs: BP 143/75  Pulse 70  Ht 5\' 11"  (1.803 m)  Wt 306 lb (138.801 kg)  BMI 42.68 kg/m2  Physical Exam Patient is in NAD.  Obese   HEENT:  Normocephalic, atraumatic. EOMI, PERRLA.  Neck: JVP is normal.  No bruits.  Lungs: clear to auscultation. No rales no wheezes.  Heart: Regular rate and rhythm. Normal S1, S2. No S3.   Gi II/VI systolic murmur. PMI not displaced.  Abdomen:  Supple, nontender. Normal bowel sounds. No masses. No hepatomegaly.  Extremities:   Good distal pulses throughout. No lower extremity edema.  Musculoskeletal :moving all extremities.  Neuro:   alert and oriented x3.  CN II-XII grossly intact.  12 lead EKG:  04/03/12.   SB 58.   Assessment and Plan:  1.  SOB  I am not conviinced this represents angina.  If it did then he should be having it while on the stationary  bike/ellip.  He does not.  Question if it relates to possible diastolic dysfunction.  QUestion if exacerbated by wt, he is not on full weight on bike.  I would recomm getting an echo to evaluate diastoliic function.  2.  HTN  FOllow  3.  HL  Good control by standard lipid panel  Would consider a lipomed in future to evlauate particle number  4.  Obesity  Congratulated on wt loss.  Would continue

## 2012-05-08 ENCOUNTER — Ambulatory Visit (HOSPITAL_COMMUNITY): Payer: BC Managed Care – PPO | Attending: Internal Medicine

## 2012-05-08 DIAGNOSIS — I079 Rheumatic tricuspid valve disease, unspecified: Secondary | ICD-10-CM | POA: Insufficient documentation

## 2012-05-08 DIAGNOSIS — I319 Disease of pericardium, unspecified: Secondary | ICD-10-CM | POA: Insufficient documentation

## 2012-05-08 DIAGNOSIS — R0602 Shortness of breath: Secondary | ICD-10-CM

## 2012-05-08 DIAGNOSIS — R0609 Other forms of dyspnea: Secondary | ICD-10-CM | POA: Insufficient documentation

## 2012-05-08 DIAGNOSIS — E119 Type 2 diabetes mellitus without complications: Secondary | ICD-10-CM | POA: Insufficient documentation

## 2012-05-08 DIAGNOSIS — I10 Essential (primary) hypertension: Secondary | ICD-10-CM | POA: Insufficient documentation

## 2012-05-08 DIAGNOSIS — R0989 Other specified symptoms and signs involving the circulatory and respiratory systems: Secondary | ICD-10-CM | POA: Insufficient documentation

## 2012-05-08 DIAGNOSIS — I059 Rheumatic mitral valve disease, unspecified: Secondary | ICD-10-CM | POA: Insufficient documentation

## 2012-05-08 NOTE — Progress Notes (Signed)
Echocardiogram performed.  

## 2012-05-11 ENCOUNTER — Other Ambulatory Visit: Payer: Self-pay | Admitting: Dermatology

## 2012-06-15 ENCOUNTER — Telehealth: Payer: Self-pay | Admitting: *Deleted

## 2012-06-16 NOTE — Telephone Encounter (Signed)
Called patient with echo results. Advised to continue exercise with bicycle and try to bring weight down per Dr.Ross. Will place in call backs for April 2014.

## 2012-06-17 ENCOUNTER — Other Ambulatory Visit: Payer: Self-pay | Admitting: Internal Medicine

## 2012-06-19 NOTE — Telephone Encounter (Signed)
Rx sent.    MW 

## 2012-07-05 ENCOUNTER — Ambulatory Visit (INDEPENDENT_AMBULATORY_CARE_PROVIDER_SITE_OTHER): Payer: BC Managed Care – PPO | Admitting: Internal Medicine

## 2012-07-05 VITALS — BP 126/74 | HR 70 | Temp 98.1°F | Wt 335.0 lb

## 2012-07-05 DIAGNOSIS — E119 Type 2 diabetes mellitus without complications: Secondary | ICD-10-CM

## 2012-07-05 DIAGNOSIS — N259 Disorder resulting from impaired renal tubular function, unspecified: Secondary | ICD-10-CM

## 2012-07-05 DIAGNOSIS — I10 Essential (primary) hypertension: Secondary | ICD-10-CM

## 2012-07-05 DIAGNOSIS — R0609 Other forms of dyspnea: Secondary | ICD-10-CM

## 2012-07-05 DIAGNOSIS — R0989 Other specified symptoms and signs involving the circulatory and respiratory systems: Secondary | ICD-10-CM

## 2012-07-05 DIAGNOSIS — C4491 Basal cell carcinoma of skin, unspecified: Secondary | ICD-10-CM

## 2012-07-05 DIAGNOSIS — M79609 Pain in unspecified limb: Secondary | ICD-10-CM

## 2012-07-05 LAB — BASIC METABOLIC PANEL
BUN: 32 mg/dL — ABNORMAL HIGH (ref 6–23)
Creatinine, Ser: 1.7 mg/dL — ABNORMAL HIGH (ref 0.4–1.5)
GFR: 42.92 mL/min — ABNORMAL LOW (ref >60.00)
Glucose, Bld: 95 mg/dL (ref 70–99)

## 2012-07-05 LAB — HEMOGLOBIN A1C: Hgb A1c MFr Bld: 7.6 % — ABNORMAL HIGH (ref 4.6–6.5)

## 2012-07-05 NOTE — Assessment & Plan Note (Signed)
BMP

## 2012-07-05 NOTE — Assessment & Plan Note (Addendum)
Last A1c showed improvement, 7.0, recently increased Amaryl to 6 mg however he is taking a milligrams without apparent problems  Plan: A1c victozt would be a good option for him if not at goal, will check with endocrinology before ( given his renal function ) In no uncertain terms I advised him a healthy diet and exercise more will help as much as any meds, risk of early death discussed

## 2012-07-05 NOTE — Assessment & Plan Note (Signed)
Saw cardiology, echocardiogram showed diastolic dysfunction, I review the report of the patient. Cardiology recommended weight loss and exercise-- we discussed the same today.

## 2012-07-05 NOTE — Assessment & Plan Note (Signed)
Saw dermatology, biopsy showed BCC. The surgical site took a long time to heal per pt, currently area is slightly scaly and red but no discharge or evidence of infection. Recommend to see dermatology regularly, if he doesn't hear from them in few months advised him to call them.

## 2012-07-05 NOTE — Assessment & Plan Note (Signed)
See previous entry, CK was normal, still has on and off symptoms. Observation for now

## 2012-07-05 NOTE — Assessment & Plan Note (Addendum)
Well-controlled, no change 

## 2012-07-05 NOTE — Progress Notes (Signed)
  Subjective:    Patient ID: Jimmy [name], male    DOB: 1950/12/05, 61 y.o.   MRN: [id number]  HPI Followup Diabetes, based on the last A1c we increase Amaryl to 6 mg, he is actually taking 8 mg. Good tolerance. Ambulatory blood sugars in the morning range from 145-178. Hypertension, good medication compliance, ambulatory BPs normal. Dyspnea on exertion, saw cardiology, had a echocardiogram, chart reviewed and discussed with the patient Skin lesion, saw dermatology, diagnosed with BCC.  Past medical history   Diabetes   Hypertension   Obesity   Renal insufficiency, sees nephrology 1 x year [facility], Dr Terri Piedra 2013  Past surgical history   Pilonidal cyst surgery  Social History: Married, 4 children Occupation: Risk analyst, Runner, broadcasting/film/video @ [employer] tobacco--no ETOH--socially  Family History: colon ca--no prostate cancer--no MI-- GF 70s y/o DM-- no M- deceased, Ca F - deceased - kidney failure    Review of Systems No chest pain, lower extremity edema at baseline No nausea, vomiting, diarrhea No symptoms consistent with low blood sugars. Had a flu shot already.     Objective:   Physical Exam General -- alert, well-developed, and overweight appearing. No apparent distress.  Lungs -- normal respiratory effort, no intercostal retractions, no accessory muscle use, and normal breath sounds.   Heart-- normal rate, regular rhythm, no murmur, and no gallop.   Skin--   2 cm  round patch of red slightly scaly skin at the area of the skin lesion surgery (BCC) Extremities-- trace symmetric pretibial edema   Neurologic-- alert & oriented X3 and strength normal in all extremities. Psych-- Cognition and judgment appear intact. Alert and cooperative with normal attention span and concentration.  not anxious appearing and not depressed appearing.       Assessment & Plan:

## 2012-07-25 ENCOUNTER — Ambulatory Visit (INDEPENDENT_AMBULATORY_CARE_PROVIDER_SITE_OTHER): Payer: BC Managed Care – PPO | Admitting: Internal Medicine

## 2012-07-25 ENCOUNTER — Encounter: Payer: Self-pay | Admitting: Internal Medicine

## 2012-07-25 VITALS — BP 132/74 | HR 61 | Temp 97.5°F | Wt 335.0 lb

## 2012-07-25 DIAGNOSIS — E119 Type 2 diabetes mellitus without complications: Secondary | ICD-10-CM

## 2012-07-25 MED ORDER — INSULIN PEN NEEDLE 32G X 6 MM MISC: Status: DC

## 2012-07-25 NOTE — Progress Notes (Signed)
  Subjective:    Patient ID: Jimmy [name], male    DOB: 23-Feb-1951, 61 y.o.   MRN: [id number]  HPI Followup in reference to his last A1c which was elevated  Past Medical History  Diagnosis Date  . Hypertension   . Diabetes mellitus   . Obesity   . Renal insufficiency     see nephrology yearly   . BCC (basal cell carcinoma of skin) 2013    dr Terri Piedra    Past Surgical History  Procedure Date  . Pilonidal cyst surgery     Review of Systems Feeling well    Objective:   Physical Exam  Vital signs stable, no apparent distress      Assessment & Plan:

## 2012-07-25 NOTE — Assessment & Plan Note (Addendum)
Previous A1c's were reviewed, he's not at goal. We discussed options including insulin and Victoza;  we agreed on victoza. I discussed side effects, how to use it, printed material from up-to-date provided as well. Samples provided. Will call for a prescription in 10 days. Today , I spent more than 15  min with the patient, >50% of the time counseling

## 2012-07-25 NOTE — Patient Instructions (Addendum)
Start victoza  0.6 mg daily for one week, then increase to 1.2 mg daily.   Call for a RX in 10 days Next office visit in 2 months Call if CBGs getting below 100 or if you have side effects

## 2012-08-04 ENCOUNTER — Other Ambulatory Visit: Payer: Self-pay

## 2012-08-04 MED ORDER — LIRAGLUTIDE 18 MG/3ML ~~LOC~~ SOLN: SUBCUTANEOUS | Status: DC

## 2012-08-04 NOTE — Telephone Encounter (Signed)
Pt LMOVM stating met with MD Drue Novel 07/25/12 and advised to take Victoza for 10 days then a Rx would be sent in. I read the OV notes:   Patient Instructions     Start victoza 0.6 mg daily for one week, then increase to 1.2 mg daily.  Call for a RX in 10 days  Next office visit in 2 months  Call if CBGs getting below 100 or if you have side effects    Need help. Just need the right Rx sent in for Victoza to pt pharmacy. Plz advise  Pt CB# [phone]

## 2012-08-04 NOTE — Telephone Encounter (Signed)
Refill done.  

## 2012-08-19 ENCOUNTER — Other Ambulatory Visit: Payer: Self-pay | Admitting: Internal Medicine

## 2012-08-21 NOTE — Telephone Encounter (Signed)
Refill done.  

## 2012-11-03 ENCOUNTER — Ambulatory Visit: Payer: BC Managed Care – PPO | Admitting: Internal Medicine

## 2012-11-08 ENCOUNTER — Encounter: Payer: Self-pay | Admitting: Internal Medicine

## 2012-11-08 ENCOUNTER — Ambulatory Visit (INDEPENDENT_AMBULATORY_CARE_PROVIDER_SITE_OTHER): Payer: BC Managed Care – PPO | Admitting: Internal Medicine

## 2012-11-08 VITALS — BP 120/76 | HR 74 | Wt 336.0 lb

## 2012-11-08 DIAGNOSIS — I10 Essential (primary) hypertension: Secondary | ICD-10-CM

## 2012-11-08 DIAGNOSIS — E119 Type 2 diabetes mellitus without complications: Secondary | ICD-10-CM

## 2012-11-08 MED ORDER — INSULIN PEN NEEDLE 32G X 6 MM MISC: Status: DC

## 2012-11-08 NOTE — Assessment & Plan Note (Signed)
Under excellent control, will see nephrology next month consequently we won't check a BMP today

## 2012-11-08 NOTE — Progress Notes (Signed)
  Subjective:    Patient ID: Jimmy [name], male    DOB: [birth year], 62 y.o.   MRN: [id number]  HPI Followup from previous visit Diabetes, started victoza, good compliance, no apparent side effects, from time to time the area of injection pink but not consistently so. Ambulatory blood sugars were more in the 170s and now more in the 150s. Hypertension, good medication compliance, ambulatory BPs excellent at around 115/65. Has appointment to see the nephrologist in April  Past Medical History  Diagnosis Date  . Hypertension   . Diabetes mellitus   . Obesity   . Renal insufficiency     see nephrology yearly   . BCC (basal cell carcinoma of skin) 2013    dr Terri Piedra    Past Surgical History  Procedure Laterality Date  . Pilonidal cyst surgery       Review of Systems No nausea, vomiting, diarrhea. Has not increase his exercise. Diet is about the same as last visit, in the last 5 months he has a completely cut out sodas.    Objective:   Physical Exam  BP 120/76  Pulse 74  Wt 336 lb (152.409 kg)  BMI 46.88 kg/m2  SpO2 94% General -- alert, well-developed .    Lungs -- normal respiratory effort, no intercostal retractions, no accessory muscle use, and normal breath sounds.   Heart-- normal rate, regular rhythm, no murmur, and no gallop.   Extremities-- +/+++ pretibial edema bilaterally  Neurologic-- alert & oriented X3 and strength normal in all extremities. Psych-- Cognition and judgment appear intact. Alert and cooperative with normal attention span and concentration.  not anxious appearing and not depressed appearing.       Assessment & Plan:

## 2012-11-08 NOTE — Assessment & Plan Note (Signed)
Started Actos few months ago, no apparent side effects, ambulatory CBGs have decreased a little. No weight loss observed. Plans to see the eye Dr. in July. Plan: A1c, if not better, will refer to endocrinology.

## 2012-12-07 ENCOUNTER — Other Ambulatory Visit: Payer: Self-pay | Admitting: Internal Medicine

## 2012-12-07 NOTE — Telephone Encounter (Signed)
Refill done.  

## 2013-01-15 ENCOUNTER — Other Ambulatory Visit: Payer: Self-pay | Admitting: Internal Medicine

## 2013-01-15 NOTE — Telephone Encounter (Signed)
Refill done.  

## 2013-03-10 ENCOUNTER — Telehealth: Payer: Self-pay | Admitting: Internal Medicine

## 2013-03-12 NOTE — Telephone Encounter (Signed)
done

## 2013-03-12 NOTE — Telephone Encounter (Signed)
Refill done by Dr. Drue Novel

## 2013-03-12 NOTE — Telephone Encounter (Signed)
Ok to refill?  Last ov 3.12.14 Last filled 5.11.13 #10 with 6 RF

## 2013-03-15 ENCOUNTER — Encounter: Payer: Self-pay | Admitting: Internal Medicine

## 2013-03-15 ENCOUNTER — Ambulatory Visit (INDEPENDENT_AMBULATORY_CARE_PROVIDER_SITE_OTHER): Payer: BC Managed Care – PPO | Admitting: Internal Medicine

## 2013-03-15 VITALS — BP 125/70 | HR 75 | Temp 98.1°F | Ht 70.5 in | Wt 325.4 lb

## 2013-03-15 DIAGNOSIS — Z23 Encounter for immunization: Secondary | ICD-10-CM

## 2013-03-15 DIAGNOSIS — E119 Type 2 diabetes mellitus without complications: Secondary | ICD-10-CM

## 2013-03-15 DIAGNOSIS — E785 Hyperlipidemia, unspecified: Secondary | ICD-10-CM

## 2013-03-15 DIAGNOSIS — Z0000 Encounter for general adult medical examination without abnormal findings: Secondary | ICD-10-CM

## 2013-03-15 DIAGNOSIS — I10 Essential (primary) hypertension: Secondary | ICD-10-CM

## 2013-03-15 DIAGNOSIS — Z2911 Encounter for prophylactic immunotherapy for respiratory syncytial virus (RSV): Secondary | ICD-10-CM

## 2013-03-15 LAB — LIPID PANEL
HDL: 34.8 mg/dL — ABNORMAL LOW (ref >39.00)
Triglycerides: 172 mg/dL — ABNORMAL HIGH (ref 0.0–149.0)
VLDL: 34.4 mg/dL (ref 0.0–40.0)

## 2013-03-15 LAB — BASIC METABOLIC PANEL
CO2: 25 mEq/L (ref 19–32)
Calcium: 9.5 mg/dL (ref 8.4–10.5)
Chloride: 103 mEq/L (ref 96–112)
Glucose, Bld: 132 mg/dL — ABNORMAL HIGH (ref 70–99)
Sodium: 135 mEq/L (ref 135–145)

## 2013-03-15 LAB — PSA: PSA: 0.69 ng/mL (ref 0.10–4.00)

## 2013-03-15 NOTE — Assessment & Plan Note (Signed)
labs

## 2013-03-15 NOTE — Assessment & Plan Note (Signed)
Under excellent control 

## 2013-03-15 NOTE — Progress Notes (Signed)
  Subjective:    Patient ID: Jimmy [name], male    DOB: 1951/04/06, 62 y.o.   MRN: [id number]  HPI CPX  Past Medical History  Diagnosis Date  . Hypertension   . Diabetes mellitus   . Obesity   . Renal insufficiency     see nephrology yearly   . BCC (basal cell carcinoma of skin) 2013    dr Terri Piedra    Past Surgical History  Procedure Laterality Date  . Pilonidal cyst surgery     History   Social History  . Marital Status: Married    Spouse Name: N/A    Number of Children: 4  . Years of Education: N/A   Occupational History  . 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 Smoking status: Never Smoker   . Smokeless tobacco: Never Used  . Alcohol Use: Yes  . Drug Use: No  . Sexually Active: Not on file   Other Topics Concern  . Not on file   Social History Narrative            Family History  Problem Relation Age of Onset  . Colon cancer Neg Hx   . Prostate cancer Neg Hx   . Diabetes Neg Hx   . Heart attack      GF 14s y/o  . Cancer Mother   . Kidney failure Father     Review of Systems Diet is about the same. He has been very active in the last 2 months ( moving a house). His blood pressure is excellent at around 120/70. His blood sugars have definitely decreased from the 150s to low 100s. No substernal chest pain, at night after moving all day he occasionally had a ache in the chest depending on how he moved his torso, the pain lasted a few seconds. No shortness or breath. DOE actually improved as he got more active. No nausea, vomiting, diarrhea or blood in the stools. No dysuria gross hematuria. Denies anxiety or depression. Edema definitely improved     Objective:   Physical Exam BP 125/70  Pulse 75  Ht 5' 10.5" (1.791 m)  Wt 325 lb 6.4 oz (147.6 kg)  BMI 46.01 kg/m2  SpO2 96%  General -- alert, well-developed, NAD .   Neck --no thyromegaly Lungs -- normal respiratory effort, no intercostal retractions, no accessory  muscle use, and normal breath sounds.   Heart-- normal rate, regular rhythm, no murmur, and no gallop.   Abdomen--soft, non-tender, no distention, no masses, no HSM, no guarding, and no rigidity.   Extremities-- trace pretibial edema , slt worse on the L Rectal-- No external abnormalities noted. Normal sphincter tone. No rectal masses or tenderness. Brown stool, Hemoccult negative Prostate:  Hard to reach d/t pt's habitus but  smooth, no enlargement, nodularity Neurologic-- alert & oriented X3 and strength normal in all extremities. Psych-- Cognition and judgment appear intact. Alert and cooperative with normal attention span and concentration.  not anxious appearing and not depressed appearing.        Assessment & Plan:

## 2013-03-15 NOTE — Assessment & Plan Note (Signed)
Doing great,Check A1c.  Continue with Actos, victoza,amaryl

## 2013-03-15 NOTE — Assessment & Plan Note (Addendum)
Td 08 pneumonia shot 09/26/09 zostavax today Colonoscopy 01/2010, tubular adenoma, next in 2016. He is doing great with lifestyle, much more active, has lost weight, dyspnea on exertion and edema improved. Labs 11/2012 and nephrology: Creatinine 1.6, potassium 4.4, hemoglobin 14.1, iron normal. Plan-- labs

## 2013-03-20 ENCOUNTER — Telehealth: Payer: Self-pay | Admitting: *Deleted

## 2013-03-20 NOTE — Telephone Encounter (Signed)
lmovm to return call to confirm nephrologist.

## 2013-03-20 NOTE — Telephone Encounter (Signed)
Message copied by Shirlee More I on Tue Mar 20, 2013 11:25 AM ------      Message from: Willow Ora E      Created: Sun Mar 18, 2013  8:40 AM       Fax a copy of the most recent labs to his nephrologist ------

## 2013-03-20 NOTE — Telephone Encounter (Signed)
Patient has left a voicemail on the triage phone stating that he was returning Ms. Flippins call.  I explained that the phone call was a fyi that the lasted lab results had been sent to his nephrologist.

## 2013-03-21 ENCOUNTER — Telehealth: Payer: Self-pay | Admitting: *Deleted

## 2013-03-21 NOTE — Telephone Encounter (Signed)
Discussed with patient, nephrologist is Dr. Annie Sable at [facility]. Results faxed per request.

## 2013-03-21 NOTE — Telephone Encounter (Signed)
Message copied by Shirlee More I on Wed Mar 21, 2013  8:59 AM ------      Message from: Jimmy [name]      Created: Sun Mar 18, 2013  8:40 AM       Fax a copy of the most recent labs to his nephrologist ------

## 2013-04-03 LAB — HM DIABETES EYE EXAM

## 2013-04-19 ENCOUNTER — Encounter: Payer: Self-pay | Admitting: *Deleted

## 2013-04-19 ENCOUNTER — Encounter: Payer: Self-pay | Admitting: Internal Medicine

## 2013-04-28 ENCOUNTER — Other Ambulatory Visit: Payer: Self-pay | Admitting: Family Medicine

## 2013-05-19 ENCOUNTER — Other Ambulatory Visit: Payer: Self-pay | Admitting: Internal Medicine

## 2013-05-22 NOTE — Telephone Encounter (Signed)
rx refilled per protocol. DJR  

## 2013-07-05 ENCOUNTER — Other Ambulatory Visit: Payer: Self-pay

## 2013-07-14 ENCOUNTER — Other Ambulatory Visit: Payer: Self-pay | Admitting: Internal Medicine

## 2013-07-16 NOTE — Telephone Encounter (Signed)
Pioglitazone refilled per protocol

## 2013-07-17 ENCOUNTER — Encounter: Payer: Self-pay | Admitting: Internal Medicine

## 2013-07-17 ENCOUNTER — Ambulatory Visit: Payer: BC Managed Care – PPO | Admitting: Internal Medicine

## 2013-07-17 ENCOUNTER — Ambulatory Visit (INDEPENDENT_AMBULATORY_CARE_PROVIDER_SITE_OTHER): Payer: BC Managed Care – PPO | Admitting: Internal Medicine

## 2013-07-17 VITALS — BP 149/75 | HR 68 | Temp 97.9°F | Wt 342.0 lb

## 2013-07-17 DIAGNOSIS — N259 Disorder resulting from impaired renal tubular function, unspecified: Secondary | ICD-10-CM

## 2013-07-17 DIAGNOSIS — R609 Edema, unspecified: Secondary | ICD-10-CM

## 2013-07-17 DIAGNOSIS — E119 Type 2 diabetes mellitus without complications: Secondary | ICD-10-CM

## 2013-07-17 DIAGNOSIS — Z23 Encounter for immunization: Secondary | ICD-10-CM

## 2013-07-17 DIAGNOSIS — I10 Essential (primary) hypertension: Secondary | ICD-10-CM

## 2013-07-17 MED ORDER — ATORVASTATIN CALCIUM 10 MG PO TABS: ORAL_TABLET | ORAL | Status: DC

## 2013-07-17 NOTE — Assessment & Plan Note (Signed)
Ambulatory BPs range from 120/80, 140/85. Plan: No change, check a CBC

## 2013-07-17 NOTE — Assessment & Plan Note (Signed)
At baseline 

## 2013-07-17 NOTE — Patient Instructions (Signed)
Get your blood work before you leave  Next visit in 4 months  for a  diabetes hypertension   follow up. No Fasting Please make an appointment    Resources you could check:   The [facility] web site for Diabetes [phone]  "The [facility] Diabetes diet" book

## 2013-07-17 NOTE — Progress Notes (Signed)
  Subjective:    Patient ID: Jimmy [name], male    DOB: [birth year], 62 y.o.   MRN: [id number]  HPI ROV Diabetes-- taking Amaryl 6 mg instead of 8, CBGs 120, 140. Hypertension--good medication compliance, lower extremity edema at baseline, ambulatory BPs below 145/80. High cholesterol--good medication compliance. Chronic renal insufficiency- due for a BMP  Past Medical History  Diagnosis Date  . Hypertension   . Diabetes mellitus   . Obesity   . Renal insufficiency     see nephrology yearly   . BCC (basal cell carcinoma of skin) 2013    dr Terri Piedra    Past Surgical History  Procedure Laterality Date  . Pilonidal cyst surgery      History   Social History  . Marital Status: Married    Spouse Name: N/A    Number of Children: 4  . Years of Education: N/A   Occupational History  . 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 Smoking status: Never Smoker   . Smokeless tobacco: Never Used  . Alcohol Use: Yes  . Drug Use: No  . Sexual Activity: Not on file   Other Topics Concern  . Not on file   Social History Narrative           \   Review of Systems Diet--  Improved , lots of juices (low sugar) Exercise-- exercising less, busy at work No  CP, SOB;  lower extremity edema at baseline   Denies  nausea, vomiting diarrhea Denies  blood in the stools       Objective:   Physical Exam  BP 149/75  Pulse 68  Temp(Src) 97.9 F (36.6 C)  Wt 342 lb (155.13 kg)  SpO2 100% General -- alert, well-developed, NAD.  Lungs -- normal respiratory effort, no intercostal retractions, no accessory muscle use, and normal breath sounds.  Heart-- normal rate, regular rhythm, no murmur.  Extremities-- +/+++ pretibial edema bilaterally at baseline  Neurologic--  alert & oriented X3. Speech normal, gait normal, strength normal in all extremities.  Psych-- Cognition and judgment appear intact. Cooperative with normal attention span and concentration. No  anxious appearing , no depressed appearing.        Assessment & Plan:

## 2013-07-17 NOTE — Progress Notes (Signed)
Pre visit review using our clinic review tool, if applicable. No additional management support is needed unless otherwise documented below in the visit note. 

## 2013-07-17 NOTE — Assessment & Plan Note (Signed)
Last creatinine slightly >  baseline, check a BMP

## 2013-07-17 NOTE — Assessment & Plan Note (Addendum)
Seems to be well-controlled, last A1c 6.8. Taking Amaryl 6 mg daily (not 8 mg) Diet improved but not exercising as much as before, weight has increased. Eye care --sees the eye doctor every July  Labs

## 2013-07-18 LAB — CBC WITH DIFFERENTIAL/PLATELET
Basophils Absolute: 0 10*3/uL (ref 0.0–0.1)
Eosinophils Absolute: 0.7 10*3/uL (ref 0.0–0.7)
Lymphocytes Relative: 20.8 % (ref 12.0–46.0)
MCHC: 34.5 g/dL (ref 30.0–36.0)
MCV: 92.4 fl (ref 78.0–100.0)
Monocytes Absolute: 0.8 10*3/uL (ref 0.1–1.0)
Neutrophils Relative %: 63.3 % (ref 43.0–77.0)
Platelets: 294 10*3/uL (ref 150.0–400.0)
RBC: 4.29 Mil/uL (ref 4.22–5.81)
RDW: 13.6 % (ref 11.5–14.6)

## 2013-07-18 LAB — BASIC METABOLIC PANEL
BUN: 26 mg/dL — ABNORMAL HIGH (ref 6–23)
CO2: 26 mEq/L (ref 19–32)
Calcium: 9.6 mg/dL (ref 8.4–10.5)
Chloride: 103 mEq/L (ref 96–112)
Creatinine, Ser: 1.7 mg/dL — ABNORMAL HIGH (ref 0.4–1.5)
Glucose, Bld: 70 mg/dL (ref 70–99)

## 2013-07-18 LAB — HEMOGLOBIN A1C: Hgb A1c MFr Bld: 7 % — ABNORMAL HIGH (ref 4.6–6.5)

## 2013-07-20 ENCOUNTER — Other Ambulatory Visit: Payer: Self-pay | Admitting: *Deleted

## 2013-07-20 MED ORDER — GLIMEPIRIDE 4 MG PO TABS: ORAL_TABLET | ORAL | Status: DC

## 2013-09-08 ENCOUNTER — Other Ambulatory Visit: Payer: Self-pay | Admitting: Internal Medicine

## 2013-09-10 NOTE — Telephone Encounter (Signed)
Victoza refilled per protocol. JG//CMA 

## 2013-10-27 ENCOUNTER — Other Ambulatory Visit: Payer: Self-pay | Admitting: Internal Medicine

## 2013-11-14 ENCOUNTER — Ambulatory Visit: Payer: BC Managed Care – PPO | Admitting: Internal Medicine

## 2013-11-17 ENCOUNTER — Other Ambulatory Visit: Payer: Self-pay | Admitting: Internal Medicine

## 2013-11-20 ENCOUNTER — Other Ambulatory Visit: Payer: Self-pay | Admitting: Internal Medicine

## 2013-11-20 ENCOUNTER — Telehealth: Payer: Self-pay | Admitting: *Deleted

## 2013-11-20 MED ORDER — TADALAFIL 20 MG PO TABS: 20.0000 mg | ORAL_TABLET | ORAL | Status: DC | PRN

## 2013-11-20 NOTE — Telephone Encounter (Signed)
rx ordered.

## 2013-11-20 NOTE — Telephone Encounter (Signed)
Ok #10 and 1 RF

## 2013-11-20 NOTE — Telephone Encounter (Signed)
rx refill- cilias 20 mg  Last OV- 07/17/13 Last refilled- 03/10/13 #10 / 1 rf

## 2013-11-22 ENCOUNTER — Ambulatory Visit: Payer: BC Managed Care – PPO | Admitting: Internal Medicine

## 2013-11-28 ENCOUNTER — Other Ambulatory Visit: Payer: Self-pay | Admitting: Internal Medicine

## 2013-12-03 ENCOUNTER — Encounter: Payer: Self-pay | Admitting: Internal Medicine

## 2013-12-03 ENCOUNTER — Ambulatory Visit (INDEPENDENT_AMBULATORY_CARE_PROVIDER_SITE_OTHER): Payer: BC Managed Care – PPO | Admitting: Internal Medicine

## 2013-12-03 VITALS — BP 139/72 | HR 68 | Temp 98.0°F | Wt 340.0 lb

## 2013-12-03 DIAGNOSIS — E119 Type 2 diabetes mellitus without complications: Secondary | ICD-10-CM

## 2013-12-03 DIAGNOSIS — N259 Disorder resulting from impaired renal tubular function, unspecified: Secondary | ICD-10-CM

## 2013-12-03 DIAGNOSIS — I10 Essential (primary) hypertension: Secondary | ICD-10-CM

## 2013-12-03 DIAGNOSIS — E669 Obesity, unspecified: Secondary | ICD-10-CM

## 2013-12-03 LAB — BASIC METABOLIC PANEL
BUN: 28 mg/dL — ABNORMAL HIGH (ref 6–23)
CALCIUM: 9.7 mg/dL (ref 8.4–10.5)
CHLORIDE: 101 meq/L (ref 96–112)
CO2: 26 mEq/L (ref 19–32)
CREATININE: 1.8 mg/dL — AB (ref 0.4–1.5)
GFR: 41.08 mL/min — AB (ref >60.00)
Glucose, Bld: 119 mg/dL — ABNORMAL HIGH (ref 70–99)
Potassium: 4.2 mEq/L (ref 3.5–5.1)
Sodium: 137 mEq/L (ref 135–145)

## 2013-12-03 LAB — AST: AST: 21 U/L (ref 0–37)

## 2013-12-03 LAB — ALT: ALT: 27 U/L (ref 0–53)

## 2013-12-03 LAB — HEMOGLOBIN A1C: Hgb A1c MFr Bld: 6.8 % — ABNORMAL HIGH (ref 4.6–6.5)

## 2013-12-03 NOTE — Progress Notes (Signed)
Subjective:    Patient ID: Jimmy [name], male    DOB: 1951/05/02, 63 y.o.   MRN: [id number]  DOS:  12/03/2013 Type of  Visit: ROV  Diabetes, good compliance with medications, ambulatory BPs fasting range from 105-151. From time to time has low sugar symptoms, has to eat a snack. Hypertension, good medication compliance, ambulatory BP is excellent at 120, 130 High cholesterol good compliance with meds. Reports he could fall asleep easy, on further questioning, he does snore.     ROS Diet, Exercise-- definitely improved after read the [facility] DM book DM---  (-) visual disturbances   No  CP, SOB No palpitations, lower extremity edema at baseline Denies  nausea, vomiting diarrhea Denies  blood in the stools    Past Medical History  Diagnosis Date  . Hypertension   . Diabetes mellitus   . Obesity   . Renal insufficiency     see nephrology yearly   . BCC (basal cell carcinoma of skin) 2013    dr Allyson Sabal     Past Surgical History  Procedure Laterality Date  . Pilonidal cyst surgery      History   Social History  . Marital Status: Married    Spouse Name: N/A    Number of Children: 4  . Years of Education: N/A   Occupational History  . 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 Smoking status: Never Smoker   . Smokeless tobacco: Never Used  . Alcohol Use: Yes  . Drug Use: No  . Sexual Activity: Not on file   Other Topics Concern  . Not on file   Social History Narrative                 Medication List       This list is accurate as of: 12/03/13  9:15 PM.  Always use your most recent med list.               acetaminophen 500 MG tablet  Commonly known as:  TYLENOL  Take 500 mg by mouth every 6 (six) hours as needed.     amLODipine 10 MG tablet  Commonly known as:  NORVASC  Take 10 mg by mouth daily.     aspirin 81 MG tablet  Take 81 mg by mouth daily.     atorvastatin 10 MG tablet  Commonly known as:  LIPITOR    TAKE 1 TABLET BY MOUTH EVERY DAY     CENTRUM SILVER PO  Take by mouth.     CIALIS 20 MG tablet  Generic drug:  tadalafil  TAKE 1 TABLET (20 MG TOTAL) BY MOUTH EVERY OTHER DAY AS NEEDED FOR ERECTILE DYSFUNCTION.     fish oil-omega-3 fatty acids 1000 MG capsule  Take 2 g by mouth daily.     Garlic 2703 MG Tabs  Take 1 tablet by mouth daily.     glimepiride 4 MG tablet  Commonly known as:  AMARYL  TAKE 1 TABLET BY MOUTH ONCE  A DAY     GLUCOSAMINE CHONDROITIN JOINT PO  Take 1 tablet by mouth daily.     losartan-hydrochlorothiazide 100-25 MG per tablet  Commonly known as:  HYZAAR  Take 1 tablet by mouth daily.     metoprolol 50 MG tablet  Commonly known as:  LOPRESSOR  TAKE 1 TABLET BY MOUTH TWICE A DAY     NOVOFINE 32G X 6 MM Misc  Generic  drug:  Insulin Pen Needle  USE DAILY AS DIRECTED     pioglitazone 30 MG tablet  Commonly known as:  ACTOS  TAKE 1 TABLET BY MOUTH EVERY DAY     VICTOZA 18 MG/3ML Sopn  Generic drug:  Liraglutide  INJECT 1.2MG  DAILY     vitamin C 100 MG tablet  Take 500 mg by mouth daily.     Vitamin D3 1000 UNITS Caps  Take by mouth.           Objective:   Physical Exam BP 139/72  Pulse 68  Temp(Src) 98 F (36.7 C)  Wt 340 lb (154.223 kg)  SpO2 96% General -- alert, well-developed, NAD.  Neck--no thyromegaly  Lungs -- normal respiratory effort, no intercostal retractions, no accessory muscle use, and normal breath sounds.  Heart-- normal rate, regular rhythm, no murmur.  Extremities-- trace pretibial edema bilaterally  Neurologic--  alert & oriented X3. Speech normal, gait normal, strength normal in all extremities.  Psych-- Cognition and judgment appear intact. Cooperative with normal attention span and concentration. No anxious or depressed appearing.        Assessment & Plan:

## 2013-12-03 NOTE — Assessment & Plan Note (Addendum)
Doing great with diet and exercise after he read the DM book from the [facility]. Has lost a couple pounds. Occasionally has low blood sugar symptoms. Plan: Decrease Amaryl to only one tablet daily, labs.

## 2013-12-03 NOTE — Patient Instructions (Signed)
Get your blood work before you leave   Next visit is for a physical exam in 3 months,  fasting Please make an appointment    You're doing great, decrease glimepiride to only one tablet in the morning.

## 2013-12-03 NOTE — Assessment & Plan Note (Addendum)
Patient reports he quit falls asleep relatively easy, OSA? ----> he is doing better with diet and exercise, will monitor this problem, consider a sleep study

## 2013-12-03 NOTE — Progress Notes (Signed)
Pre visit review using our clinic review tool, if applicable. No additional management support is needed unless otherwise documented below in the visit note. 

## 2013-12-03 NOTE — Assessment & Plan Note (Signed)
labs

## 2013-12-03 NOTE — Assessment & Plan Note (Signed)
Under excellent control, good medication compliance, no change

## 2013-12-04 ENCOUNTER — Telehealth: Payer: Self-pay | Admitting: Internal Medicine

## 2013-12-04 NOTE — Telephone Encounter (Signed)
Relevant patient education assigned to patient using Emmi. ° °

## 2013-12-05 ENCOUNTER — Other Ambulatory Visit: Payer: Self-pay | Admitting: Internal Medicine

## 2013-12-06 ENCOUNTER — Other Ambulatory Visit: Payer: Self-pay

## 2013-12-17 ENCOUNTER — Telehealth: Payer: Self-pay

## 2013-12-17 NOTE — Telephone Encounter (Signed)
Relevant patient education assigned to patient using Emmi. ° °

## 2014-02-09 ENCOUNTER — Other Ambulatory Visit: Payer: Self-pay | Admitting: Internal Medicine

## 2014-03-06 ENCOUNTER — Telehealth: Payer: Self-pay | Admitting: *Deleted

## 2014-03-06 NOTE — Telephone Encounter (Addendum)
Medication List and allergies: Reviewed and updated  90 Day supply/mail order: N/A Local prescriptions: [facility] [place]  Immunizations Due: Prevnar  A/P FH, PSH, or Personal Hx: Reviewed and updated Flu vaccine: 06/2013 Tdap: Td 01/2007 PNA: (HER74) 08/2009 Shingles: 02/2013 CCS: 01/2010 Diverticulosis/Polyps-->5 years PSA: 02/2013 0.69  To Discuss with Provider: Not at this time

## 2014-03-07 ENCOUNTER — Ambulatory Visit (INDEPENDENT_AMBULATORY_CARE_PROVIDER_SITE_OTHER): Payer: BC Managed Care – PPO | Admitting: Internal Medicine

## 2014-03-07 ENCOUNTER — Encounter: Payer: Self-pay | Admitting: Internal Medicine

## 2014-03-07 VITALS — BP 114/71 | HR 71 | Temp 98.0°F | Ht 71.3 in | Wt 305.0 lb

## 2014-03-07 DIAGNOSIS — I10 Essential (primary) hypertension: Secondary | ICD-10-CM

## 2014-03-07 DIAGNOSIS — Z23 Encounter for immunization: Secondary | ICD-10-CM

## 2014-03-07 DIAGNOSIS — N259 Disorder resulting from impaired renal tubular function, unspecified: Secondary | ICD-10-CM

## 2014-03-07 DIAGNOSIS — E119 Type 2 diabetes mellitus without complications: Secondary | ICD-10-CM

## 2014-03-07 DIAGNOSIS — Z0000 Encounter for general adult medical examination without abnormal findings: Secondary | ICD-10-CM

## 2014-03-07 LAB — LIPID PANEL
CHOL/HDL RATIO: 2
Cholesterol: 91 mg/dL (ref 0–200)
HDL: 47 mg/dL (ref >39.00)
LDL CALC: 27 mg/dL (ref 0–99)
NONHDL: 44
Triglycerides: 84 mg/dL (ref 0.0–149.0)
VLDL: 16.8 mg/dL (ref 0.0–40.0)

## 2014-03-07 LAB — HEMOGLOBIN A1C: Hgb A1c MFr Bld: 5.7 % (ref 4.6–6.5)

## 2014-03-07 IMAGING — CR DG CHEST 2V
2 series · 2 of 2 positions shown · non-contrast
Comparison: None.

CLINICAL DATA: Shortness of breath

CHEST - 2 VIEW

[view not recorded (1 of 2)]
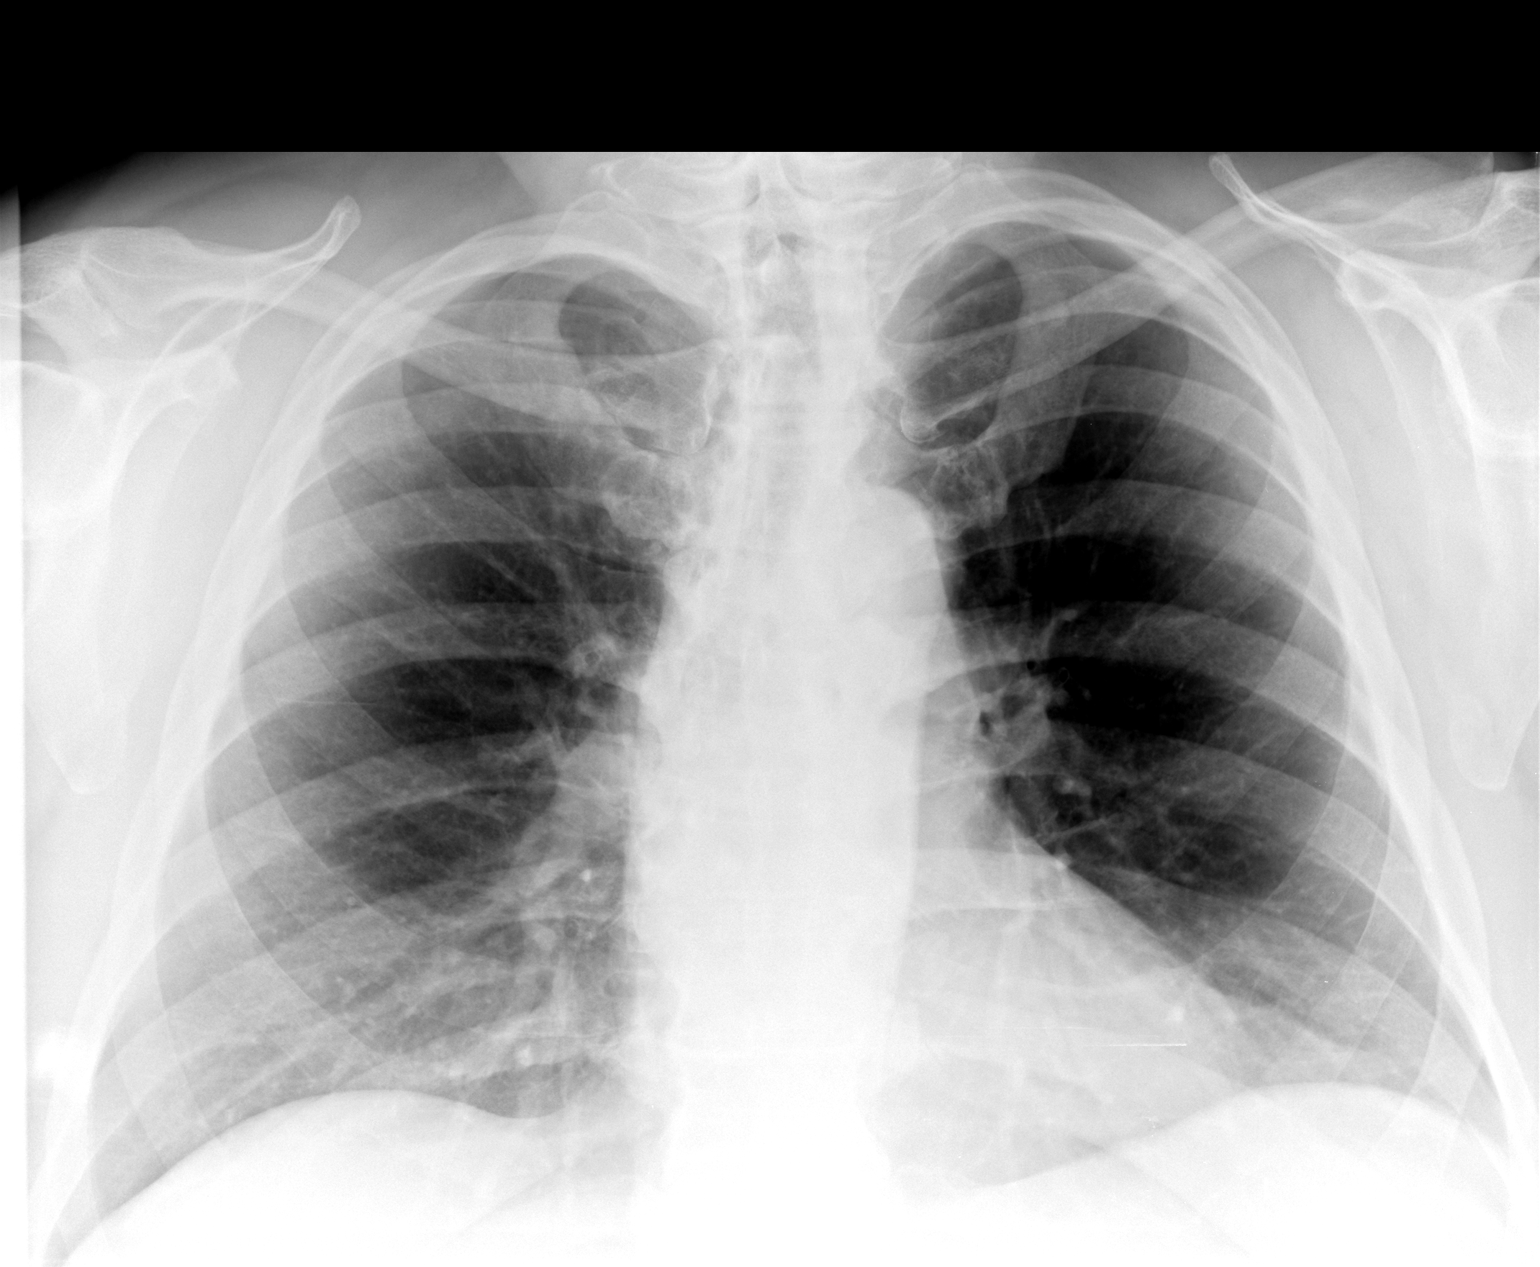

[view not recorded (2 of 2)]
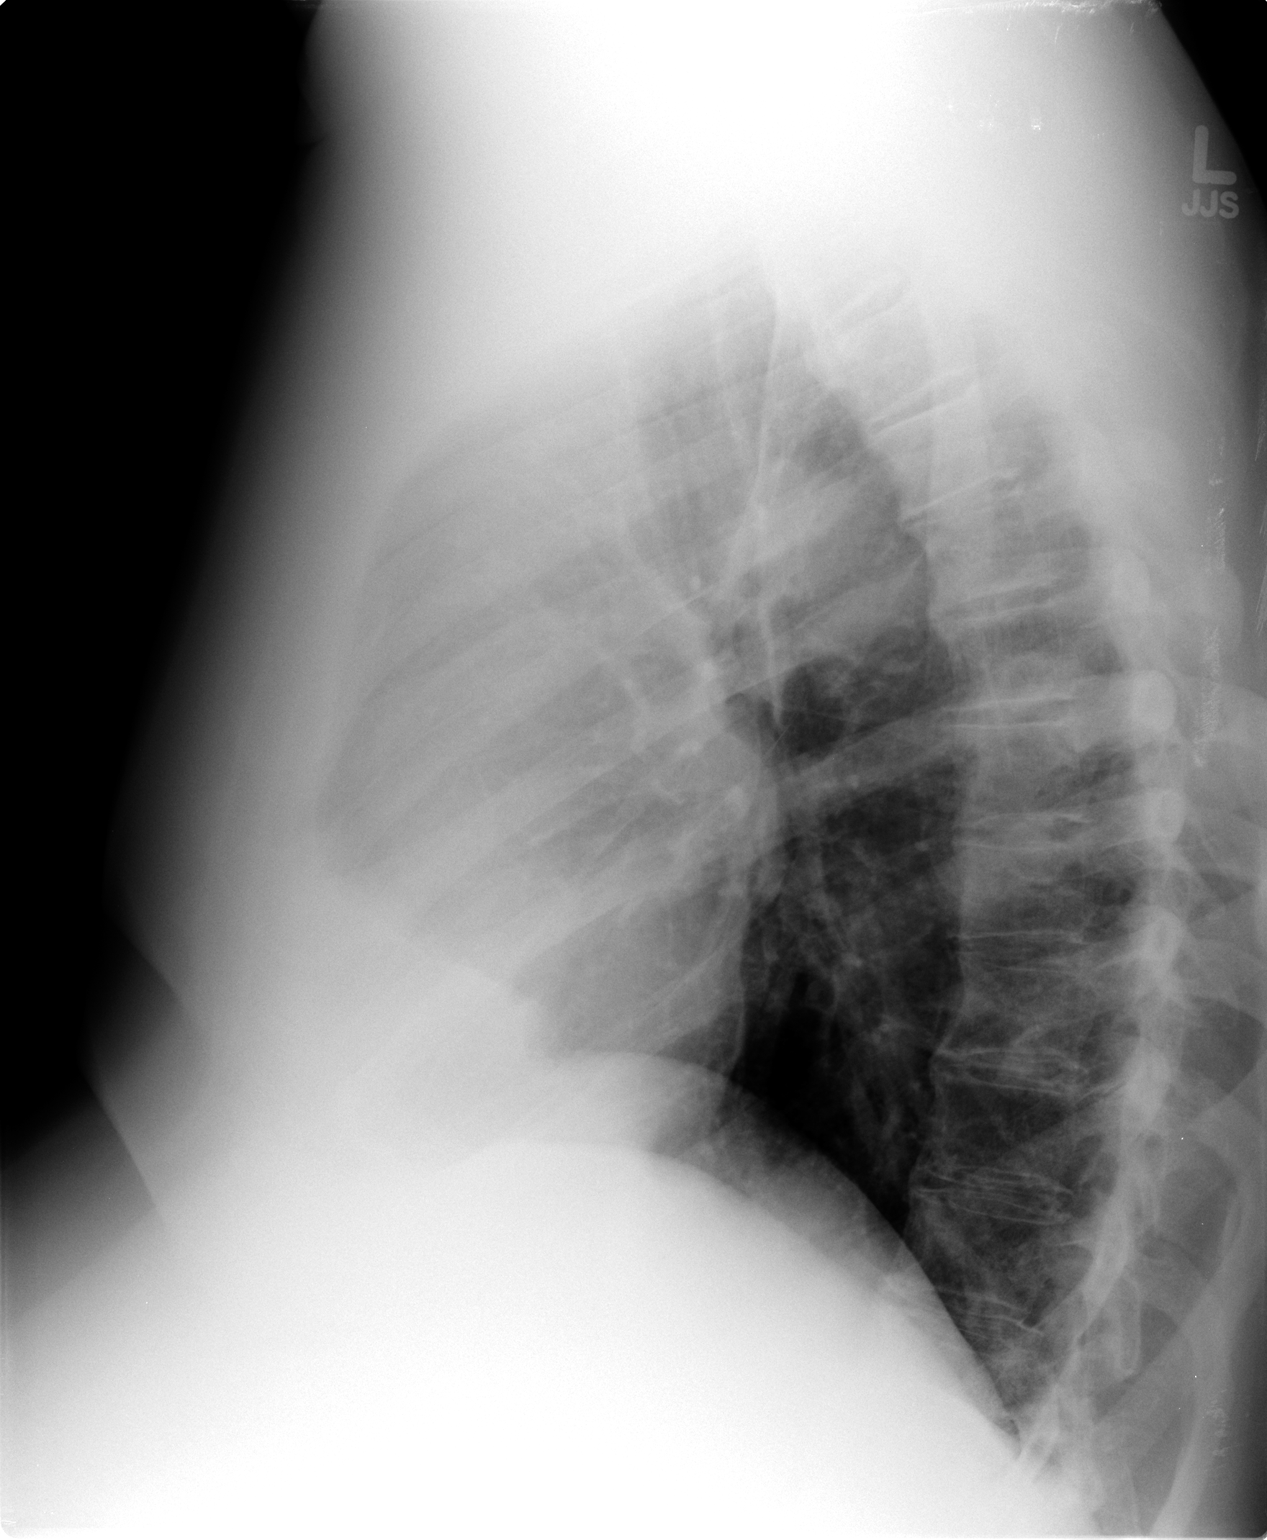

[2 of 2 positions shown; findings below may reference images not displayed]

FINDINGS: Small spurs at multiple contiguous levels in the mid and
lower thoracic spine. Lungs clear.  Heart size and pulmonary
vascularity normal.  No effusion.
IMPRESSION: No acute disease

## 2014-03-07 MED ORDER — PIOGLITAZONE HCL 30 MG PO TABS: ORAL_TABLET | ORAL | Status: DC

## 2014-03-07 MED ORDER — LOSARTAN POTASSIUM-HCTZ 100-25 MG PO TABS: 1.0000 | ORAL_TABLET | Freq: Every day | ORAL | Status: DC

## 2014-03-07 NOTE — Assessment & Plan Note (Signed)
Good compliance of medication, under excellent control, average systolic BP 833.  Improvement in control likely due to better lifestyle

## 2014-03-07 NOTE — Assessment & Plan Note (Signed)
To see nephrology 8-15

## 2014-03-07 NOTE — Patient Instructions (Signed)
Get your blood work before you leave     Next visit is for routine check up regards your blood sugar , blood pressure in 3-4 months No need to come back fasting Please make an appointment

## 2014-03-07 NOTE — Progress Notes (Signed)
Subjective:    Patient ID: Jimmy [name], male    DOB: [birth year], 63 y.o.   MRN: [id number]  DOS:  03/07/2014 Type of visit - description:  CPX    ROS No  CP, SOB No palpitations, no lower extremity edema Denies  nausea, vomiting diarrhea, blood in the stools No GERD  Sx. (-) cough, sputum production (-) wheezing, chest congestion No dysuria, gross hematuria, difficulty urinating  No anxiety, depression    Past Medical History  Diagnosis Date  . Hypertension   . Diabetes mellitus   . Obesity   . Renal insufficiency     see nephrology yearly   . BCC (basal cell carcinoma of skin) 2013    dr Allyson Sabal     Past Surgical History  Procedure Laterality Date  . Pilonidal cyst surgery      History   Social History  . Marital Status: Married    Spouse Name: N/A    Number of Children: 4  . Years of Education: N/A   Occupational History  . 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 Smoking status: Never Smoker   . Smokeless tobacco: Never Used  . Alcohol Use: Yes  . Drug Use: No  . Sexual Activity: Not on file   Other Topics Concern  . Not on file   Social History Narrative   Grown children               Family History  Problem Relation Age of Onset  . Colon cancer Neg Hx   . Prostate cancer Neg Hx   . Diabetes Neg Hx   . Heart attack      GF 31s y/o  . Cancer Mother   . Kidney failure Father        Medication List       This list is accurate as of: 03/07/14  4:19 PM.  Always use your most recent med list.               acetaminophen 500 MG tablet  Commonly known as:  TYLENOL  Take 500 mg by mouth every 6 (six) hours as needed.     amLODipine 10 MG tablet  Commonly known as:  NORVASC  Take 10 mg by mouth daily.     aspirin 81 MG tablet  Take 81 mg by mouth daily.     atorvastatin 10 MG tablet  Commonly known as:  LIPITOR  TAKE 1 TABLET BY MOUTH EVERY DAY     CENTRUM SILVER PO  Take by mouth.     CIALIS 20  MG tablet  Generic drug:  tadalafil  TAKE 1 TABLET (20 MG TOTAL) BY MOUTH EVERY OTHER DAY AS NEEDED FOR ERECTILE DYSFUNCTION.     fish oil-omega-3 fatty acids 1000 MG capsule  Take 2 g by mouth daily.     Garlic 2297 MG Tabs  Take 1 tablet by mouth daily.     glimepiride 4 MG tablet  Commonly known as:  AMARYL  TAKE 1 TABLET BY MOUTH ONCE  A DAY     GLUCOSAMINE CHONDROITIN JOINT PO  Take 1 tablet by mouth daily.     losartan-hydrochlorothiazide 100-25 MG per tablet  Commonly known as:  HYZAAR  Take 1 tablet by mouth daily.     metoprolol 50 MG tablet  Commonly known as:  LOPRESSOR  TAKE 1 TABLET BY MOUTH TWICE A DAY     NOVOFINE 32G  X 6 MM Misc  Generic drug:  Insulin Pen Needle  USE DAILY AS DIRECTED     pioglitazone 30 MG tablet  Commonly known as:  ACTOS  TAKE 1 TABLET BY MOUTH EVERY DAY     VICTOZA 18 MG/3ML Sopn  Generic drug:  Liraglutide  INJECT 1.2MG  DAILY     vitamin C 100 MG tablet  Take 500 mg by mouth daily.     Vitamin D3 1000 UNITS Caps  Take by mouth.           Objective:   Physical Exam BP 114/71  Pulse 71  Temp(Src) 98 F (36.7 C)  Ht 5' 11.3" (1.811 m)  Wt 305 lb (138.347 kg)  BMI 42.18 kg/m2  SpO2 98% General -- alert, well-developed, NAD.  Neck --no thyromegaly  HEENT-- Not pale. Lungs -- normal respiratory effort, no intercostal retractions, no accessory muscle use, and normal breath sounds.  Heart-- normal rate, regular rhythm, no murmur.  Abdomen-- Not distended, good bowel sounds,soft, non-tender.  Extremities-- trace pretibial edema bilaterally  Neurologic--  alert & oriented X3. Speech normal, gait appropriate for age, strength symmetric and appropriate for age.  Psych-- Cognition and judgment appear intact. Cooperative with normal attention span and concentration. No anxious or depressed appearing.        Assessment & Plan:

## 2014-03-07 NOTE — Assessment & Plan Note (Signed)
Has significantly improved his diet, has lost 35 pounds. Exercise is limited. CBGs in the 69s, 70s. Every other week Has episodes of mild low blood sugar symptoms, usually related to skipping a meal, symptoms resolve with sugar intake. Plan: A1c, consider adjust his medications

## 2014-03-07 NOTE — Assessment & Plan Note (Signed)
Td 08 pneumonia shot 09/26/09 prevnar today zostavax 2014 Colonoscopy 01/2010, tubular adenoma, next in 2016. Cancer screening: PSA has been stable for many years, DRE and PSA next year He is doing great with lifestyle! Lost 35 pounds    labs

## 2014-03-07 NOTE — Progress Notes (Signed)
Pre visit review using our clinic review tool, if applicable. No additional management support is needed unless otherwise documented below in the visit note. 

## 2014-03-08 ENCOUNTER — Encounter: Payer: Self-pay | Admitting: *Deleted

## 2014-03-21 LAB — HM DIABETES EYE EXAM

## 2014-04-01 ENCOUNTER — Encounter: Payer: Self-pay | Admitting: Internal Medicine

## 2014-05-03 ENCOUNTER — Other Ambulatory Visit: Payer: Self-pay | Admitting: Internal Medicine

## 2014-05-17 ENCOUNTER — Other Ambulatory Visit: Payer: Self-pay | Admitting: Internal Medicine

## 2014-05-30 ENCOUNTER — Telehealth: Payer: Self-pay | Admitting: Internal Medicine

## 2014-05-30 MED ORDER — TADALAFIL 20 MG PO TABS: ORAL_TABLET | ORAL | Status: DC

## 2014-05-30 NOTE — Telephone Encounter (Signed)
Caller name: Traci Relation to pt: self Call back number: [phone] Pharmacy: [facility]  Reason for call:   Patient is requesting a refill of cialis

## 2014-05-30 NOTE — Telephone Encounter (Signed)
Pt is requesting refill for Cialis.  Last OV: 03/07/2014  Last Fill: 11/29/2013 # 10 with 1 RF  Please advise.

## 2014-05-30 NOTE — Telephone Encounter (Signed)
Cialis sent to [facility].

## 2014-05-30 NOTE — Telephone Encounter (Signed)
Ok 10 and 3 RF 

## 2014-06-04 ENCOUNTER — Telehealth: Payer: Self-pay | Admitting: *Deleted

## 2014-06-04 NOTE — Telephone Encounter (Signed)
PA for Cialis initiated on [email or web address]. Awaiting determination. JG//CMA

## 2014-06-06 NOTE — Telephone Encounter (Signed)
PA approved effective 06/04/2014 through 08/29/2038. Reference number: [id number]. Approval letter sent for scanning. JG//CMA

## 2014-07-08 ENCOUNTER — Ambulatory Visit: Payer: BC Managed Care – PPO | Admitting: Internal Medicine

## 2014-07-15 ENCOUNTER — Ambulatory Visit (INDEPENDENT_AMBULATORY_CARE_PROVIDER_SITE_OTHER): Payer: BC Managed Care – PPO

## 2014-07-15 ENCOUNTER — Encounter: Payer: Self-pay | Admitting: Internal Medicine

## 2014-07-15 ENCOUNTER — Ambulatory Visit (INDEPENDENT_AMBULATORY_CARE_PROVIDER_SITE_OTHER): Payer: BC Managed Care – PPO | Admitting: Internal Medicine

## 2014-07-15 VITALS — BP 138/82 | HR 80 | Temp 98.1°F | Wt 284.4 lb

## 2014-07-15 DIAGNOSIS — N183 Chronic kidney disease, stage 3 unspecified: Secondary | ICD-10-CM

## 2014-07-15 DIAGNOSIS — I10 Essential (primary) hypertension: Secondary | ICD-10-CM

## 2014-07-15 DIAGNOSIS — E119 Type 2 diabetes mellitus without complications: Secondary | ICD-10-CM

## 2014-07-15 DIAGNOSIS — Z23 Encounter for immunization: Secondary | ICD-10-CM

## 2014-07-15 LAB — BASIC METABOLIC PANEL
BUN: 39 mg/dL — ABNORMAL HIGH (ref 6–23)
CALCIUM: 9.4 mg/dL (ref 8.4–10.5)
CO2: 24 meq/L (ref 19–32)
CREATININE: 1.9 mg/dL — AB (ref 0.4–1.5)
Chloride: 104 mEq/L (ref 96–112)
GFR: 38.04 mL/min — ABNORMAL LOW (ref >60.00)
GLUCOSE: 103 mg/dL — AB (ref 70–99)
Potassium: 4.6 mEq/L (ref 3.5–5.1)
Sodium: 138 mEq/L (ref 135–145)

## 2014-07-15 LAB — HEMOGLOBIN A1C: Hgb A1c MFr Bld: 5.3 % (ref 4.6–6.5)

## 2014-07-15 NOTE — Patient Instructions (Signed)
Get your blood work before you leave     Please come back to the office ------ for a physical exam. Fasting

## 2014-07-15 NOTE — Assessment & Plan Note (Signed)
Hypertension, nephrology decrease amlodipine to half tablet daily, BP still under excellent control

## 2014-07-15 NOTE — Progress Notes (Signed)
Pre visit review using our clinic review tool, if applicable. No additional management support is needed unless otherwise documented below in the visit note. 

## 2014-07-15 NOTE — Assessment & Plan Note (Signed)
Diabetes, Based on the last A1c, glimepiride was  Decreased to half tablet, CBGs remain under excellent control in the 90s, he is doing a great job with diet, exercise and lost weight. Warned about low blood sugar symptoms

## 2014-07-15 NOTE — Progress Notes (Signed)
Subjective:    Patient ID: Jimmy [name], male    DOB: 1951-08-05, 63 y.o.   MRN: [id number]  DOS:  07/15/2014 Type of visit - description : rov Interval history: DM-- Continue to do great with diet, has lost a number of additional pounds. CBGs in the 90s Saw nephrology 3 months ago, note reviewed, felt to be stable. Hypertension, nephrology decrease amlodipine, blood pressure remained under excellent control around 105, 112/60s.    ROS Denies chest pain or difficulty breathing. No lower extremity edema Denies symptoms consistent with low blood sugars or low BPs  Past Medical History  Diagnosis Date  . Hypertension   . Diabetes mellitus   . Obesity   . Renal insufficiency     see nephrology yearly   . BCC (basal cell carcinoma of skin) 2013    dr Allyson Sabal     Past Surgical History  Procedure Laterality Date  . Pilonidal cyst surgery      History   Social History  . Marital Status: Married    Spouse Name: N/A    Number of Children: 4  . Years of Education: N/A   Occupational History  . 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 Smoking status: Never Smoker   . Smokeless tobacco: Never Used  . Alcohol Use: Yes  . Drug Use: No  . Sexual Activity: Not on file   Other Topics Concern  . Not on file   Social History Narrative   Grown children                  Medication List       This list is accurate as of: 07/15/14  5:17 PM.  Always use your most recent med list.               acetaminophen 500 MG tablet  Commonly known as:  TYLENOL  Take 500 mg by mouth every 6 (six) hours as needed.     amLODipine 10 MG tablet  Commonly known as:  NORVASC  Take 5 mg by mouth daily.     aspirin 81 MG tablet  Take 81 mg by mouth daily.     atorvastatin 10 MG tablet  Commonly known as:  LIPITOR  Take 5 mg by mouth daily.     CENTRUM SILVER PO  Take by mouth.     fish oil-omega-3 fatty acids 1000 MG capsule  Take 2 g by  mouth daily.     Garlic 5621 MG Tabs  Take 1 tablet by mouth daily.     glimepiride 4 MG tablet  Commonly known as:  AMARYL  TAKE 1/2 TABLET BY MOUTH ONCE  A DAY     GLUCOSAMINE CHONDROITIN JOINT PO  Take 1 tablet by mouth daily.     losartan-hydrochlorothiazide 100-25 MG per tablet  Commonly known as:  HYZAAR  Take 1 tablet by mouth daily.     metoprolol 50 MG tablet  Commonly known as:  LOPRESSOR  TAKE 1 TABLET BY MOUTH TWICE DAILY     NOVOFINE 32G X 6 MM Misc  Generic drug:  Insulin Pen Needle  USE DAILY AS DIRECTED     pioglitazone 30 MG tablet  Commonly known as:  ACTOS  TAKE 1 TABLET BY MOUTH EVERY DAY     tadalafil 20 MG tablet  Commonly known as:  CIALIS  TAKE 1 TABLET (20 MG TOTAL) BY MOUTH EVERY OTHER DAY AS  NEEDED FOR ERECTILE DYSFUNCTION.     VICTOZA 18 MG/3ML Sopn  Generic drug:  Liraglutide  INJECT 1.2MG  INTRAMUSCULARLY EVERDAY     vitamin C 100 MG tablet  Take 500 mg by mouth daily.     Vitamin D3 1000 UNITS Caps  Take by mouth.           Objective:   Physical Exam BP 138/82 mmHg  Pulse 80  Temp(Src) 98.1 F (36.7 C) (Oral)  Wt 284 lb 6 oz (128.992 kg)  SpO2 97% General -- alert, well-developed, NAD.   Lungs -- normal respiratory effort, no intercostal retractions, no accessory muscle use, and normal breath sounds.  Heart-- normal rate, regular rhythm, no murmur.  Extremities-- no pretibial edema bilaterally  Neurologic--  alert & oriented X3. Speech normal, gait appropriate for age, strength symmetric and appropriate for age.   Psych-- Cognition and judgment appear intact. Cooperative with normal attention span and concentration. No anxious or depressed appearing.      Assessment & Plan:    Hyperlipidemia, based on last FLP, Lipitor was decrease to half tablet. Will check a FLP on return to the office  Chronic renal insufficiency, saw nephrology 3 months ago, felt to be stable, follow-up in one year

## 2014-07-17 ENCOUNTER — Telehealth: Payer: Self-pay | Admitting: Internal Medicine

## 2014-07-17 NOTE — Telephone Encounter (Signed)
Spoke with Pt, informed him of lab results. Instructed him to discontinue taking glimepiride as instructed by Dr. Larose Kells. Pt verbalized understanding.

## 2014-07-17 NOTE — Telephone Encounter (Signed)
Pt returning your call

## 2014-08-01 ENCOUNTER — Other Ambulatory Visit: Payer: Self-pay | Admitting: Internal Medicine

## 2014-09-11 ENCOUNTER — Other Ambulatory Visit: Payer: Self-pay | Admitting: Internal Medicine

## 2014-09-16 ENCOUNTER — Other Ambulatory Visit: Payer: Self-pay | Admitting: Internal Medicine

## 2014-10-02 ENCOUNTER — Other Ambulatory Visit: Payer: Self-pay | Admitting: Internal Medicine

## 2015-01-17 ENCOUNTER — Encounter: Payer: Self-pay | Admitting: Gastroenterology

## 2015-01-28 ENCOUNTER — Encounter: Payer: Self-pay | Admitting: Internal Medicine

## 2015-01-28 ENCOUNTER — Other Ambulatory Visit: Payer: Self-pay | Admitting: Internal Medicine

## 2015-01-28 ENCOUNTER — Encounter: Payer: Self-pay | Admitting: Gastroenterology

## 2015-02-03 ENCOUNTER — Other Ambulatory Visit: Payer: Self-pay | Admitting: Internal Medicine

## 2015-02-18 ENCOUNTER — Telehealth: Payer: Self-pay | Admitting: Internal Medicine

## 2015-02-18 NOTE — Telephone Encounter (Signed)
pre visit letter mailed 02/18/15 °

## 2015-02-20 ENCOUNTER — Telehealth: Payer: Self-pay | Admitting: Internal Medicine

## 2015-02-20 NOTE — Telephone Encounter (Signed)
Pre visit letter mailed 02/18/15 °

## 2015-03-04 ENCOUNTER — Encounter: Payer: Self-pay | Admitting: Gastroenterology

## 2015-03-10 ENCOUNTER — Telehealth: Payer: Self-pay | Admitting: Behavioral Health

## 2015-03-10 ENCOUNTER — Encounter: Payer: Self-pay | Admitting: Behavioral Health

## 2015-03-10 NOTE — Telephone Encounter (Signed)
Pre-Visit Call completed with patient and chart updated.   Pre-Visit Info documented in Specialty Comments under [employer].    

## 2015-03-11 ENCOUNTER — Ambulatory Visit (INDEPENDENT_AMBULATORY_CARE_PROVIDER_SITE_OTHER): Payer: BLUE CROSS/BLUE SHIELD | Admitting: Internal Medicine

## 2015-03-11 ENCOUNTER — Encounter: Payer: Self-pay | Admitting: Internal Medicine

## 2015-03-11 VITALS — BP 126/64 | HR 61 | Temp 97.6°F | Ht 71.25 in | Wt 297.0 lb

## 2015-03-11 DIAGNOSIS — Z0000 Encounter for general adult medical examination without abnormal findings: Secondary | ICD-10-CM | POA: Diagnosis not present

## 2015-03-11 DIAGNOSIS — E119 Type 2 diabetes mellitus without complications: Secondary | ICD-10-CM | POA: Diagnosis not present

## 2015-03-11 LAB — CBC WITH DIFFERENTIAL/PLATELET
Basophils Absolute: 0.1 10*3/uL (ref 0.0–0.1)
Basophils Relative: 0.9 % (ref 0.0–3.0)
EOS PCT: 6 % — AB (ref 0.0–5.0)
Eosinophils Absolute: 0.4 10*3/uL (ref 0.0–0.7)
HEMATOCRIT: 42.5 % (ref 39.0–52.0)
Hemoglobin: 14.4 g/dL (ref 13.0–17.0)
Lymphocytes Relative: 20.7 % (ref 12.0–46.0)
Lymphs Abs: 1.3 10*3/uL (ref 0.7–4.0)
MCHC: 33.8 g/dL (ref 30.0–36.0)
MCV: 94.9 fl (ref 78.0–100.0)
Monocytes Absolute: 0.5 10*3/uL (ref 0.1–1.0)
Monocytes Relative: 8.7 % (ref 3.0–12.0)
NEUTROS PCT: 63.7 % (ref 43.0–77.0)
Neutro Abs: 3.9 10*3/uL (ref 1.4–7.7)
Platelets: 247 10*3/uL (ref 150.0–400.0)
RBC: 4.48 Mil/uL (ref 4.22–5.81)
RDW: 13.3 % (ref 11.5–15.5)
WBC: 6.2 10*3/uL (ref 4.0–10.5)

## 2015-03-11 LAB — LIPID PANEL
CHOL/HDL RATIO: 3
Cholesterol: 127 mg/dL (ref 0–200)
HDL: 45.9 mg/dL (ref >39.00)
LDL CALC: 57 mg/dL (ref 0–99)
NONHDL: 81.1
Triglycerides: 122 mg/dL (ref 0.0–149.0)
VLDL: 24.4 mg/dL (ref 0.0–40.0)

## 2015-03-11 LAB — ALT: ALT: 21 U/L (ref 0–53)

## 2015-03-11 LAB — AST: AST: 17 U/L (ref 0–37)

## 2015-03-11 LAB — BASIC METABOLIC PANEL
BUN: 37 mg/dL — AB (ref 6–23)
CO2: 27 mEq/L (ref 19–32)
CREATININE: 1.95 mg/dL — AB (ref 0.40–1.50)
Calcium: 9.9 mg/dL (ref 8.4–10.5)
Chloride: 103 mEq/L (ref 96–112)
GFR: 37.06 mL/min — ABNORMAL LOW (ref >60.00)
Glucose, Bld: 102 mg/dL — ABNORMAL HIGH (ref 70–99)
POTASSIUM: 4.8 meq/L (ref 3.5–5.1)
Sodium: 138 mEq/L (ref 135–145)

## 2015-03-11 LAB — PSA: PSA: 0.82 ng/mL (ref 0.10–4.00)

## 2015-03-11 LAB — HEMOGLOBIN A1C: Hgb A1c MFr Bld: 5.7 % (ref 4.6–6.5)

## 2015-03-11 MED ORDER — PIOGLITAZONE HCL 30 MG PO TABS: 30.0000 mg | ORAL_TABLET | Freq: Every day | ORAL | Status: DC

## 2015-03-11 MED ORDER — METOPROLOL TARTRATE 50 MG PO TABS: 50.0000 mg | ORAL_TABLET | Freq: Two times a day (BID) | ORAL | Status: DC

## 2015-03-11 MED ORDER — HYDROCORTISONE 2.5 % EX CREA: TOPICAL_CREAM | Freq: Two times a day (BID) | CUTANEOUS | Status: DC

## 2015-03-11 MED ORDER — LOSARTAN POTASSIUM-HCTZ 100-25 MG PO TABS: 1.0000 | ORAL_TABLET | Freq: Every day | ORAL | Status: DC

## 2015-03-11 MED ORDER — LIRAGLUTIDE 18 MG/3ML ~~LOC~~ SOPN: 1.2000 mg | PEN_INJECTOR | Freq: Every day | SUBCUTANEOUS | Status: DC

## 2015-03-11 MED ORDER — ATORVASTATIN CALCIUM 10 MG PO TABS: 5.0000 mg | ORAL_TABLET | Freq: Every day | ORAL | Status: DC

## 2015-03-11 NOTE — Assessment & Plan Note (Signed)
Last A1c less than 6, glimepiride was discontinued, ambulatory blood sugars still around 100, 110 the morning. Check A1c

## 2015-03-11 NOTE — Assessment & Plan Note (Addendum)
Td 08 pneumonia shot 09/26/09 prevnar 2015 zostavax 2014  Colonoscopy 01/2010, tubular adenoma, next in 2016. Cancer screening:  DRE normal today, checking a PSA  Diet and exercise: Still somewhat active, diet less strict compared to last year, he has gained few pounds. Encourage to go back to a better diet   Chronic medical problems: Seem well-controlled, check appropriate labs and refill as needed

## 2015-03-11 NOTE — Progress Notes (Signed)
Pre visit review using our clinic review tool, if applicable. No additional management support is needed unless otherwise documented below in the visit note. 

## 2015-03-11 NOTE — Assessment & Plan Note (Signed)
Based on the last cholesterol panel, Lipitor was decreased to half dose. Labs today

## 2015-03-11 NOTE — Patient Instructions (Signed)
Get your blood work before you leave   Use the cream twice a day until better

## 2015-03-11 NOTE — Progress Notes (Signed)
Subjective:    Patient ID: Jimmy [name], male    DOB: 03-12-1951, 64 y.o.   MRN: [id number]  DOS:  03/11/2015 Type of visit - description : CPX Interval history: In general doing well. Diet is less strict compared to last year   Review of Systems Constitutional: No fever. No chills. No unexplained wt changes. No unusual sweats  HEENT: No dental problems, no ear discharge, no facial swelling, no voice changes. No eye discharge, no eye  redness , no  intolerance to light   Respiratory: No wheezing , no  difficulty breathing. No cough , no mucus production  Cardiovascular: No CP, minimal leg swelling , no  Palpitations  GI: no nausea, no vomiting, no diarrhea , no  abdominal pain.  No blood in the stools. No dysphagia, no odynophagia    Endocrine: No polyphagia, no polyuria , no polydipsia  GU: No dysuria, gross hematuria, difficulty urinating. No urinary urgency, no frequency.  Musculoskeletal: No joint swellings or unusual aches or pains  Skin: He was changing a old carpet last week, developed bug bites, unsure what type of bugs, they were painful and itchy, slightly better but not completely gone  Allergic, immunologic: No environmental allergies , no  food allergies  Neurological: No dizziness no  syncope. No headaches. No diplopia, no slurred, no slurred speech, no motor deficits, no facial  Numbness  Hematological: No enlarged lymph nodes, no easy bruising , no unusual bleedings  Psychiatry: No suicidal ideas, no hallucinations, no beavior problems, no confusion.  No unusual/severe anxiety, no depression    Past Medical History  Diagnosis Date  . Hypertension   . Diabetes mellitus   . Obesity   . Renal insufficiency     see nephrology yearly   . BCC (basal cell carcinoma of skin) 2013    dr Allyson Sabal     Past Surgical History  Procedure Laterality Date  . Pilonidal cyst surgery      History   Social History  . Marital Status: Married    Spouse Name: N/A  .  Number of Children: 4  . Years of Education: N/A   Occupational History  . 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 Smoking status: Never Smoker   . Smokeless tobacco: Never Used  . Alcohol Use: Yes  . Drug Use: No  . Sexual Activity: Not on file   Other Topics Concern  . Not on file   Social History Narrative   Grown children , 6 g-children              Family History  Problem Relation Age of Onset  . Colon cancer Neg Hx   . Prostate cancer Neg Hx   . Diabetes Neg Hx   . Heart attack      GF 71s y/o  . Cancer Mother   . Kidney failure Father        Medication List       This list is accurate as of: 03/11/15  5:06 PM.  Always use your most recent med list.               acetaminophen 500 MG tablet  Commonly known as:  TYLENOL  Take 500 mg by mouth every 6 (six) hours as needed.     amLODipine 10 MG tablet  Commonly known as:  NORVASC  Take 5 mg by mouth daily.     aspirin 81 MG tablet  Take 81 mg by mouth daily.     atorvastatin 10 MG tablet  Commonly known as:  LIPITOR  Take 0.5 tablets (5 mg total) by mouth daily.     CENTRUM SILVER PO  Take by mouth.     fish oil-omega-3 fatty acids 1000 MG capsule  Take 2 g by mouth daily.     Garlic 4665 MG Tabs  Take 1 tablet by mouth daily.     GLUCOSAMINE CHONDROITIN JOINT PO  Take 1 tablet by mouth daily.     hydrocortisone 2.5 % cream  Apply topically 2 (two) times daily.     Liraglutide 18 MG/3ML Sopn  Commonly known as:  VICTOZA  Inject 0.2 mLs (1.2 mg total) as directed daily.     losartan-hydrochlorothiazide 100-25 MG per tablet  Commonly known as:  HYZAAR  Take 1 tablet by mouth daily.     metoprolol 50 MG tablet  Commonly known as:  LOPRESSOR  Take 1 tablet (50 mg total) by mouth 2 (two) times daily.     NOVOFINE 32G X 6 MM Misc  Generic drug:  Insulin Pen Needle  USE AS DIRECTED ONCE A DAY TO INJECT VICTOZA     pioglitazone 30 MG tablet  Commonly  known as:  ACTOS  Take 1 tablet (30 mg total) by mouth daily.     tadalafil 20 MG tablet  Commonly known as:  CIALIS  TAKE 1 TABLET (20 MG TOTAL) BY MOUTH EVERY OTHER DAY AS NEEDED FOR ERECTILE DYSFUNCTION.     vitamin C 100 MG tablet  Take 500 mg by mouth daily.     Vitamin D3 1000 UNITS Caps  Take by mouth.           Objective:   Physical Exam BP 126/64 mmHg  Pulse 61  Temp(Src) 97.6 F (36.4 C) (Oral)  Ht 5' 11.25" (1.81 m)  Wt 297 lb (134.718 kg)  BMI 41.12 kg/m2  SpO2 97%  General:   Well developed, well nourished . NAD.  Neck:  Full range of motion. Supple. No  Thyromegaly  HEENT:  Normocephalic . Face symmetric, atraumatic Lungs:  CTA B Normal respiratory effort, no intercostal retractions, no accessory muscle use. Heart: RRR,  no murmur.  No pretibial edema bilaterally  Abdomen:  Not distended, soft, non-tender. No rebound or rigidity. No mass,organomegaly Skin: Exposed areas without rash. Not pale. Not jaundice. At the leg he has scattered, less than half centimeter maculopapular slightly erythematous lesions. Rectal:  External abnormalities: none. Normal sphincter tone. No rectal masses or tenderness.  No stools found Prostate: Prostate gland firm and smooth, no enlargement, nodularity, tenderness, mass, asymmetry or induration.  Neurologic:  alert & oriented X3.  Speech normal, gait appropriate for age and unassisted Strength symmetric and appropriate for age.  Psych: Cognition and judgment appear intact.  Cooperative with normal attention span and concentration.  Behavior appropriate. No anxious or depressed appearing.       Assessment & Plan:     Suspected insect bites, prescribe hydrocortisone 2.5%.

## 2015-03-14 ENCOUNTER — Ambulatory Visit (AMBULATORY_SURGERY_CENTER): Payer: Self-pay | Admitting: *Deleted

## 2015-03-14 VITALS — Ht 70.5 in | Wt 300.0 lb

## 2015-03-14 DIAGNOSIS — Z86010 Personal history of colonic polyps: Secondary | ICD-10-CM

## 2015-03-14 NOTE — Progress Notes (Signed)
No egg or soy allergy No issues with past sedation No diet pills No home 02  emmi declined

## 2015-03-17 ENCOUNTER — Encounter: Payer: BC Managed Care – PPO | Admitting: Internal Medicine

## 2015-03-27 LAB — HM DIABETES EYE EXAM

## 2015-03-28 ENCOUNTER — Ambulatory Visit (AMBULATORY_SURGERY_CENTER): Payer: BLUE CROSS/BLUE SHIELD | Admitting: Gastroenterology

## 2015-03-28 ENCOUNTER — Encounter: Payer: Self-pay | Admitting: Gastroenterology

## 2015-03-28 VITALS — BP 93/55 | HR 61 | Temp 97.2°F | Resp 9 | Ht 70.0 in | Wt 300.0 lb

## 2015-03-28 DIAGNOSIS — Z86010 Personal history of colonic polyps: Secondary | ICD-10-CM

## 2015-03-28 DIAGNOSIS — K5730 Diverticulosis of large intestine without perforation or abscess without bleeding: Secondary | ICD-10-CM

## 2015-03-28 MED ORDER — SODIUM CHLORIDE 0.9 % IV SOLN: 500.0000 mL | INTRAVENOUS | Status: DC

## 2015-03-28 NOTE — Patient Instructions (Signed)

## 2015-03-28 NOTE — Progress Notes (Signed)
Report to PACU, RN, vss, BBS= Clear.  

## 2015-03-28 NOTE — Op Note (Signed)
[place]  [address]. [place], [id number]   COLONOSCOPY PROCEDURE REPORT  PATIENT: Jimmy [name], Jimmy [name]  MR#: [id number] BIRTHDATE: 15-Oct-1950 , 76  yrs. old GENDER: male ENDOSCOPIST: Milus Banister, MD PROCEDURE DATE:  03/28/2015 PROCEDURE:   Colonoscopy, surveillance First Screening Colonoscopy - Avg.  risk and is 50 yrs.  old or older - No.  Prior Negative Screening - Now for repeat screening. N/A  History of Adenoma - Now for follow-up colonoscopy & has been > or = to 3 yrs.  Yes hx of adenoma.  Has been 3 or more years since last colonoscopy.  Recommend repeat exam, <10 yrs? No ASA CLASS:   Class II INDICATIONS:Surveillance due to prior colonic neoplasia. MEDICATIONS: Monitored anesthesia care and Propofol 250 mg IV  DESCRIPTION OF PROCEDURE:   After the risks benefits and alternatives of the procedure were thoroughly explained, informed consent was obtained.  The digital rectal exam revealed no abnormalities of the rectum.   The [id number]  endoscope was introduced through the anus and advanced to the cecum, which was identified by both the appendix and ileocecal valve. No adverse events experienced.   The quality of the prep was excellent.  The instrument was then slowly withdrawn as the colon was fully examined. Estimated blood loss is zero unless otherwise noted in this procedure report.   COLON FINDINGS: There was mild diverticulosis noted in the left colon.   The examination was otherwise normal.  Retroflexed views revealed no abnormalities. The time to cecum = 4.3 Withdrawal time = 9.7   The scope was withdrawn and the procedure completed. COMPLICATIONS: There were no immediate complications.  ENDOSCOPIC IMPRESSION: 1.   Mild diverticulosis was noted in the left colon 2.   The examination was otherwise normal; no polyps or cancers  RECOMMENDATIONS: You should continue to follow colorectal cancer screening guidelines for "routine risk"  patients with a repeat colonoscopy in 10 years.   eSigned:  Milus Banister, MD 03/28/2015 8:12 AM   cc: Kathlene November, MD

## 2015-05-01 ENCOUNTER — Other Ambulatory Visit: Payer: Self-pay | Admitting: Internal Medicine

## 2015-05-30 LAB — BASIC METABOLIC PANEL
BUN: 45 mg/dL — AB (ref 4–21)
Creatinine: 1.9 mg/dL — AB (ref 0.6–1.3)
GLUCOSE: 124 mg/dL
Potassium: 4.8 mmol/L (ref 3.4–5.3)
SODIUM: 136 mmol/L — AB (ref 137–147)

## 2015-07-07 ENCOUNTER — Other Ambulatory Visit: Payer: Self-pay | Admitting: Internal Medicine

## 2015-07-11 ENCOUNTER — Encounter: Payer: Self-pay | Admitting: Internal Medicine

## 2015-07-11 ENCOUNTER — Ambulatory Visit (INDEPENDENT_AMBULATORY_CARE_PROVIDER_SITE_OTHER): Payer: BLUE CROSS/BLUE SHIELD | Admitting: Internal Medicine

## 2015-07-11 VITALS — BP 122/64 | HR 66 | Temp 97.6°F | Ht 70.0 in | Wt 305.0 lb

## 2015-07-11 DIAGNOSIS — I10 Essential (primary) hypertension: Secondary | ICD-10-CM | POA: Diagnosis not present

## 2015-07-11 DIAGNOSIS — E119 Type 2 diabetes mellitus without complications: Secondary | ICD-10-CM

## 2015-07-11 DIAGNOSIS — Z114 Encounter for screening for human immunodeficiency virus [HIV]: Secondary | ICD-10-CM

## 2015-07-11 DIAGNOSIS — Z09 Encounter for follow-up examination after completed treatment for conditions other than malignant neoplasm: Secondary | ICD-10-CM

## 2015-07-11 DIAGNOSIS — Z1159 Encounter for screening for other viral diseases: Secondary | ICD-10-CM

## 2015-07-11 LAB — HEMOGLOBIN A1C: Hgb A1c MFr Bld: 5.8 % (ref 4.6–6.5)

## 2015-07-11 NOTE — Patient Instructions (Signed)
Get your blood work before you leave     Check the  blood pressure weekly  Be sure your blood pressure is between 110/65 and  145/85.  if it is consistently higher or lower, let me know   Diabetes: Check your blood sugar     3-4 times a week  at different times of the day  GOALS: Fasting before a meal 70- 130 2 hours after a meal less than 180 At bedtime 90-150 Call if consistently not at goal   Next visit  for a  routine checkup in 5-6 months    Please schedule an appointment at the front desk   No need to come back fasting

## 2015-07-11 NOTE — Progress Notes (Signed)
Pre visit review using our clinic review tool, if applicable. No additional management support is needed unless otherwise documented below in the visit note. 

## 2015-07-11 NOTE — Addendum Note (Signed)
Addended by: Janalee Dane C on: 07/11/2015 02:00 PM   Modules accepted: Orders, Medications

## 2015-07-11 NOTE — Progress Notes (Signed)
Subjective:    Patient ID: Jimmy [name], male    DOB: Jun 12, 1951, 64 y.o.   MRN: [id number]  DOS:  07/11/2015 Type of visit - description : Routine office visit Interval history: In general feeling well. Has not been as active as before you to work. Gained a few pounds. Med list  is reviewed and corrected. No ambulatory CBGs or BPs.   Wt Readings from Last 3 Encounters:  07/11/15 305 lb (138.347 kg)  03/28/15 300 lb (136.079 kg)  03/14/15 300 lb (136.079 kg)    Review of Systems No chest pain or difficulty breathing. Edema mild and at baseline Continue with lower extremity pain as before  Past Medical History  Diagnosis Date  . Hypertension   . Diabetes mellitus   . Obesity   . Renal insufficiency     see nephrology yearly   . BCC (basal cell carcinoma of skin) 2013    dr Allyson Sabal   . CKD (chronic kidney disease), stage III     Past Surgical History  Procedure Laterality Date  . Pilonidal cyst surgery    . Colonoscopy    . Polypectomy      Social History   Social History  . Marital Status: Married    Spouse Name: N/A  . Number of Children: 4  . Years of Education: N/A   Occupational History  . 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 Smoking status: Never Smoker   . Smokeless tobacco: Never Used  . Alcohol Use: 0.0 oz/week    0 Standard drinks or equivalent per week     Comment: 6 beers a year -rare   . Drug Use: No  . Sexual Activity: Not on file   Other Topics Concern  . Not on file   Social History Narrative   Grown children , 6 g-children                 Medication List       This list is accurate as of: 07/11/15  1:01 PM.  Always use your most recent med list.               acetaminophen 500 MG tablet  Commonly known as:  TYLENOL  Take 500 mg by mouth every 6 (six) hours as needed.     amLODipine 10 MG tablet  Commonly known as:  NORVASC  Take 5 mg by mouth daily.     aspirin 81 MG tablet  Take  81 mg by mouth daily.     atorvastatin 10 MG tablet  Commonly known as:  LIPITOR  Take 0.5 tablets (5 mg total) by mouth daily.     CENTRUM SILVER PO  Take by mouth.     CIALIS 20 MG tablet  Generic drug:  tadalafil  TAKE 1 TABLET (20 MG TOTAL) BY MOUTH EVERY OTHER DAY AS NEEDED FOR ERECTILE DYSFUNCTION.     fish oil-omega-3 fatty acids 1000 MG capsule  Take 2 g by mouth daily.     Garlic [id number] MG Tabs  Take 1 tablet by mouth daily.     GLUCOSAMINE CHONDROITIN JOINT PO  Take 1 tablet by mouth daily.     hydrocortisone 2.5 % cream  Apply topically 2 (two) times daily.     Liraglutide 18 MG/3ML Sopn  Commonly known as:  VICTOZA  Inject 0.2 mLs (1.2 mg total) as directed daily.     losartan-hydrochlorothiazide 100-25 MG tablet  Commonly known as:  HYZAAR  Take 1 tablet by mouth daily.     metoprolol 50 MG tablet  Commonly known as:  LOPRESSOR  Take 1 tablet (50 mg total) by mouth 2 (two) times daily.     NOVOFINE 32G X 6 MM Misc  Generic drug:  Insulin Pen Needle  USE AS DIRECTED ONCE A DAY TO INJECT VICTOZA     pioglitazone 30 MG tablet  Commonly known as:  ACTOS  Take 15 mg by mouth daily.     pioglitazone 30 MG tablet  Commonly known as:  ACTOS  Take 1 tablet (30 mg total) by mouth daily.     vitamin C 100 MG tablet  Take 500 mg by mouth daily.     Vitamin D3 1000 UNITS Caps  Take by mouth.           Objective:   Physical Exam BP 122/64 mmHg  Pulse 66  Temp(Src) 97.6 F (36.4 C) (Oral)  Ht 5\' 10"  (1.778 m)  Wt 305 lb (138.347 kg)  BMI 43.76 kg/m2  SpO2 96% General:   Well developed, well nourished . NAD.  HEENT:  Normocephalic . Face symmetric, atraumatic Lungs:  CTA B Normal respiratory effort, no intercostal retractions, no accessory muscle use. Heart: RRR,  no murmur.  +/+++ pretibial edema bilaterally  Skin: Not pale. Not jaundice Neurologic:  alert & oriented X3.  Speech normal, gait appropriate for age and unassisted Psych--    Cognition and judgment appear intact.  Cooperative with normal attention span and concentration.  Behavior appropriate. No anxious or depressed appearing.      Assessment & Plan:   Assessment> DM HTN Obesity Renal insufficiency, sees nephrology, Creat ~1.9 BCC 2013 Dr. Allyson Sabal DOE 2013, saw cards echo diastolic dysfx,Rx---weight loss and exercise LE pain, 2013: CKs normal, vascular exam was normal  PLAN: DM: Since the last time he was here he is taking half of Actos (15 mg), not doing well with diet . Discuss the need of diet, check A1c. HTN: Since the last visit, amlodipine was stopped apparently by nephrology 05-2015. BP today is very good , no ambulatory BPs. Recommend to check his BPs and continue present care Primary care: Had a flu shot 05-2015 RTC 5-6 months

## 2015-07-11 NOTE — Assessment & Plan Note (Signed)
DM: Since the last time he was here he is taking half of Actos (15 mg), not doing well with diet . Discuss the need of diet, check A1c. HTN: Since the last visit, amlodipine was stopped apparently by nephrology 05-2015. BP today is very good , no ambulatory BPs. Recommend to check his BPs and continue present care Primary care: Had a flu shot 05-2015 RTC 5-6 months

## 2015-07-12 LAB — HIV ANTIBODY (ROUTINE TESTING W REFLEX): HIV: NONREACTIVE

## 2015-07-12 LAB — HEPATITIS C ANTIBODY: HCV AB: NEGATIVE

## 2015-09-04 MED FILL — PIOGLITAZONE HCL 30 MG TAB: 30 | 30 days supply | Qty: 30 | Fill #2

## 2015-09-04 MED FILL — VICTOZA 2-PAK 18 MG/3 ML PE: 18 | 30 days supply | Qty: 6 | Fill #4

## 2015-09-04 MED FILL — ATORVASTATIN 10 MG TABLET: 10 | 30 days supply | Qty: 15 | Fill #4

## 2015-09-04 MED FILL — METOPROLOL TARTRATE 50 MG T: 50 | 30 days supply | Qty: 60 | Fill #5

## 2015-09-08 MED FILL — LOSARTAN-HCTZ 100-25 MG TAB: 100-25 | 30 days supply | Qty: 30 | Fill #5

## 2015-10-03 MED FILL — LOSARTAN-HCTZ 100-25 MG TAB: 100-25 | 30 days supply | Qty: 30 | Fill #6

## 2015-10-03 MED FILL — METOPROLOL TARTRATE 50 MG T: 50 | 30 days supply | Qty: 60 | Fill #6

## 2015-10-03 MED FILL — ATORVASTATIN 10 MG TABLET: 10 | 30 days supply | Qty: 15 | Fill #5

## 2015-10-03 MED FILL — VICTOZA 2-PAK 18 MG/3 ML PE: 18 | 30 days supply | Qty: 6 | Fill #5

## 2015-10-13 ENCOUNTER — Other Ambulatory Visit: Payer: Self-pay | Admitting: Internal Medicine

## 2015-10-13 MED FILL — NOVOFINE 32G NEEDLES: 32G X 6 MM | 90 days supply | Qty: 100 | Fill #0

## 2015-11-04 MED FILL — ATORVASTATIN 10 MG TABLET: 10 | 30 days supply | Qty: 15 | Fill #6

## 2015-11-04 MED FILL — METOPROLOL TARTRATE 50 MG T: 50 | 30 days supply | Qty: 60 | Fill #7

## 2015-11-04 MED FILL — VICTOZA 2-PAK 18 MG/3 ML PE: 18 | 30 days supply | Qty: 6 | Fill #6

## 2015-11-04 MED FILL — LOSARTAN-HCTZ 100-25 MG TAB: 100-25 | 30 days supply | Qty: 30 | Fill #7

## 2015-11-04 MED FILL — PIOGLITAZONE HCL 30 MG TAB: 30 | 30 days supply | Qty: 30 | Fill #3

## 2015-12-03 MED FILL — METOPROLOL TARTRATE 50 MG T: 50 | 30 days supply | Qty: 60 | Fill #8

## 2015-12-05 MED FILL — LOSARTAN-HCTZ 100-25 MG TAB: 100-25 | 30 days supply | Qty: 30 | Fill #8

## 2015-12-05 MED FILL — VICTOZA 2-PAK 18 MG/3 ML PE: 18 | 30 days supply | Qty: 6 | Fill #7

## 2015-12-05 MED FILL — ATORVASTATIN 10 MG TABLET: 10 | 30 days supply | Qty: 15 | Fill #7

## 2015-12-05 MED FILL — CIALIS 20 MG TABLET: 20 | 30 days supply | Qty: 6 | Fill #1

## 2016-01-05 ENCOUNTER — Other Ambulatory Visit: Payer: Self-pay | Admitting: Internal Medicine

## 2016-01-05 MED FILL — NOVOFINE 32G NEEDLES: 32G X 6 MM | 90 days supply | Qty: 100 | Fill #1

## 2016-01-05 MED FILL — LOSARTAN-HCTZ 100-25 MG TAB: 100-25 | 30 days supply | Qty: 30 | Fill #0

## 2016-01-05 MED FILL — VICTOZA 2-PAK 18 MG/3 ML PE: 18 | 30 days supply | Qty: 6 | Fill #8

## 2016-01-05 MED FILL — ATORVASTATIN 10 MG TABLET: 10 | 30 days supply | Qty: 15 | Fill #8

## 2016-01-05 MED FILL — METOPROLOL TARTRATE 50 MG T: 50 | 30 days supply | Qty: 60 | Fill #0

## 2016-01-05 MED FILL — PIOGLITAZONE HCL 30 MG TAB: 30 | 30 days supply | Qty: 30 | Fill #4

## 2016-01-13 ENCOUNTER — Encounter: Payer: Self-pay | Admitting: Internal Medicine

## 2016-01-13 ENCOUNTER — Ambulatory Visit (INDEPENDENT_AMBULATORY_CARE_PROVIDER_SITE_OTHER): Payer: BLUE CROSS/BLUE SHIELD | Admitting: Internal Medicine

## 2016-01-13 VITALS — BP 112/62 | HR 63 | Temp 98.2°F | Ht 70.0 in | Wt 302.4 lb

## 2016-01-13 DIAGNOSIS — F528 Other sexual dysfunction not due to a substance or known physiological condition: Secondary | ICD-10-CM

## 2016-01-13 DIAGNOSIS — Z09 Encounter for follow-up examination after completed treatment for conditions other than malignant neoplasm: Secondary | ICD-10-CM

## 2016-01-13 DIAGNOSIS — N183 Chronic kidney disease, stage 3 unspecified: Secondary | ICD-10-CM

## 2016-01-13 DIAGNOSIS — E118 Type 2 diabetes mellitus with unspecified complications: Secondary | ICD-10-CM

## 2016-01-13 DIAGNOSIS — E785 Hyperlipidemia, unspecified: Secondary | ICD-10-CM | POA: Diagnosis not present

## 2016-01-13 LAB — LIPID PANEL
CHOL/HDL RATIO: 3
Cholesterol: 111 mg/dL (ref 0–200)
HDL: 38 mg/dL — ABNORMAL LOW (ref >39.00)
LDL Cholesterol: 56 mg/dL (ref 0–99)
NonHDL: 73.22
TRIGLYCERIDES: 88 mg/dL (ref 0.0–149.0)
VLDL: 17.6 mg/dL (ref 0.0–40.0)

## 2016-01-13 LAB — BASIC METABOLIC PANEL
BUN: 43 mg/dL — AB (ref 6–23)
CO2: 25 mEq/L (ref 19–32)
Calcium: 9.7 mg/dL (ref 8.4–10.5)
Chloride: 100 mEq/L (ref 96–112)
Creatinine, Ser: 2.03 mg/dL — ABNORMAL HIGH (ref 0.40–1.50)
GFR: 35.28 mL/min — ABNORMAL LOW (ref >60.00)
Glucose, Bld: 123 mg/dL — ABNORMAL HIGH (ref 70–99)
POTASSIUM: 4.1 meq/L (ref 3.5–5.1)
SODIUM: 135 meq/L (ref 135–145)

## 2016-01-13 LAB — AST: AST: 19 U/L (ref 0–37)

## 2016-01-13 LAB — HEMOGLOBIN A1C: Hgb A1c MFr Bld: 6.4 % (ref 4.6–6.5)

## 2016-01-13 LAB — ALT: ALT: 21 U/L (ref 0–53)

## 2016-01-13 NOTE — Progress Notes (Signed)
Subjective:    Patient ID: Jimmy [name], male    DOB: [birth year], 65 y.o.   MRN: [id number]  DOS:  01/13/2016 Type of visit - description : Routine visit Interval history: DM: Good medication compliance, CBGs ~  120s in the last couple of weeks. HTN: Good med compliance, ambulatory BPs in the 120s/60s range. Obesity: Started to cut down the sugar intake a month ago and actually is on a brand-new diet x 2 weeks. Per his own scales has lost ~ 10 pounds.  Wt Readings from Last 3 Encounters:  01/13/16 302 lb 6 oz (137.156 kg)  07/11/15 305 lb (138.347 kg)  03/28/15 300 lb (136.079 kg)     Review of Systems No chest pain or difficulty breathing No nausea, vomiting, diarrhea. No lower extremity paresthesias  Past Medical History  Diagnosis Date  . Hypertension   . Diabetes mellitus   . Obesity   . Renal insufficiency     see nephrology yearly   . BCC (basal cell carcinoma of skin) 2013    dr Allyson Sabal   . CKD (chronic kidney disease), stage III     Past Surgical History  Procedure Laterality Date  . Pilonidal cyst surgery    . Colonoscopy    . Polypectomy      Social History   Social History  . Marital Status: Married    Spouse Name: N/A  . Number of Children: 4  . Years of Education: N/A   Occupational History  . 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 Smoking status: Never Smoker   . Smokeless tobacco: Never Used  . Alcohol Use: 0.0 oz/week    0 Standard drinks or equivalent per week     Comment: 6 beers a year -rare   . Drug Use: No  . Sexual Activity: Not on file   Other Topics Concern  . Not on file   Social History Narrative   Grown children , 6 g-children                 Medication List       This list is accurate as of: 01/13/16  5:13 PM.  Always use your most recent med list.               amLODipine 10 MG tablet  Commonly known as:  NORVASC  Take 5 mg by mouth daily. Reported on 01/13/2016     aspirin  81 MG tablet  Take 81 mg by mouth daily. Reported on 01/13/2016     atorvastatin 10 MG tablet  Commonly known as:  LIPITOR  Take 0.5 tablets (5 mg total) by mouth daily.     CENTRUM SILVER PO  Take by mouth.     CIALIS 20 MG tablet  Generic drug:  tadalafil  TAKE 1 TABLET (20 MG TOTAL) BY MOUTH EVERY OTHER DAY AS NEEDED FOR ERECTILE DYSFUNCTION.     fish oil-omega-3 fatty acids 1000 MG capsule  Take 2 g by mouth daily.     Garlic [id number] MG Caps  Take 2,000 mg by mouth daily.     GLUCOSAMINE CHONDROITIN JOINT PO  Take 1,500 mg by mouth daily.     hydrocortisone 2.5 % cream  Apply topically 2 (two) times daily.     Insulin Pen Needle 32G X 6 MM Misc  Commonly known as:  NOVOFINE  Use to inject Victoza     Liraglutide 18 MG/3ML Sopn  Commonly known as:  VICTOZA  Inject 0.2 mLs (1.2 mg total) as directed daily.     losartan-hydrochlorothiazide 100-25 MG tablet  Commonly known as:  HYZAAR  Take 1 tablet by mouth daily.     metoprolol 50 MG tablet  Commonly known as:  LOPRESSOR  Take 1 tablet (50 mg total) by mouth 2 (two) times daily.     pioglitazone 30 MG tablet  Commonly known as:  ACTOS  Take 15 mg by mouth daily.     Potassium 99 MG Tabs  Take 99 mg by mouth daily.     PREBIOTIC PRODUCT PO  Take 1 scoop by mouth daily.     vitamin C 100 MG tablet  Take 1,000 mg by mouth daily.     Vitamin D3 1000 units Caps  Take 1,000 Units by mouth daily.           Objective:   Physical Exam BP 112/62 mmHg  Pulse 63  Temp(Src) 98.2 F (36.8 C) (Oral)  Ht 5\' 10"  (1.778 m)  Wt 302 lb 6 oz (137.156 kg)  BMI 43.39 kg/m2  SpO2 96% General:   Well developed, well nourished . NAD.  HEENT:  Normocephalic . Face symmetric, atraumatic Lungs:  CTA B Normal respiratory effort, no intercostal retractions, no accessory muscle use. Heart: RRR,  no murmur.  No pretibial edema bilaterally  Skin: Not pale. Not jaundice DIABETIC FEET EXAM: No lower extremity  edema Normal pedal pulses bilaterally Skin normal, nails normal, no calluses Pinprick examination of the feet normal. Neurologic:  alert & oriented X3.  Speech normal, gait appropriate for age and unassisted Psych--  Cognition and judgment appear intact.  Cooperative with normal attention span and concentration.  Behavior appropriate. No anxious or depressed appearing.      Assessment & Plan:   Assessment> DM -- used to be on glimepiride, dc after he lost weight HTN Obesity Renal insufficiency, sees nephrology, Creat ~1.9 BCC 2013 Dr. Allyson Sabal DOE 2013, saw cards echo diastolic dysfx,Rx---weight loss and exercise LE pain, 2013: CKs normal, vascular exam was normal  PLAN: DM: Started on your diet a couple of weeks ago, loosing weight, feet exam negative today.  Actos dose decreased to  15 mg based on nephrology rec, continue Victoza. Check a A1C High cholesterol: Continue Lipitor 5 mg , check a FLP AST ALT. HTN: controlled, continue hyzar, amlodipine. Renal insufficiency: Check a BMP. RTC CPX 4 months

## 2016-01-13 NOTE — Patient Instructions (Signed)
GO TO THE LAB : Get the blood work     GO TO THE FRONT DESK Schedule your next appointment for a complete physical exam in 4 months, no fasting     Continue  checking your blood sugars,  BPs

## 2016-01-13 NOTE — Progress Notes (Signed)
Pre visit review using our clinic review tool, if applicable. No additional management support is needed unless otherwise documented below in the visit note. 

## 2016-01-13 NOTE — Assessment & Plan Note (Signed)
DM: Started on your diet a couple of weeks ago, loosing weight, feet exam negative today.  Actos dose decreased to  15 mg based on nephrology rec, continue Victoza. Check a A1C High cholesterol: Continue Lipitor 5 mg , check a FLP AST ALT. HTN: controlled, continue hyzar, amlodipine. Renal insufficiency: Check a BMP. RTC CPX 4 months

## 2016-02-02 ENCOUNTER — Other Ambulatory Visit: Payer: Self-pay | Admitting: Internal Medicine

## 2016-02-02 MED FILL — METOPROLOL TARTRATE 50 MG T: 50 | 30 days supply | Qty: 60 | Fill #1

## 2016-02-02 MED FILL — VICTOZA 2-PAK 18 MG/3 ML PE: 18 | 30 days supply | Qty: 6 | Fill #0

## 2016-02-03 ENCOUNTER — Other Ambulatory Visit: Payer: Self-pay | Admitting: Internal Medicine

## 2016-02-03 MED FILL — ATORVASTATIN 10 MG TABLET: 10 | 30 days supply | Qty: 15 | Fill #0

## 2016-02-03 MED FILL — LOSARTAN-HCTZ 100-25 MG TAB: 100-25 | 30 days supply | Qty: 30 | Fill #1

## 2016-03-01 MED FILL — PIOGLITAZONE HCL 30 MG TAB: 30 | 30 days supply | Qty: 30 | Fill #5

## 2016-03-03 MED FILL — LOSARTAN-HCTZ 100-25 MG TAB: 100-25 | 30 days supply | Qty: 30 | Fill #2

## 2016-03-03 MED FILL — VICTOZA 2-PAK 18 MG/3 ML PE: 18 | 30 days supply | Qty: 6 | Fill #1

## 2016-03-03 MED FILL — ATORVASTATIN 10 MG TABLET: 10 | 30 days supply | Qty: 15 | Fill #1

## 2016-03-03 MED FILL — METOPROLOL TARTRATE 50 MG T: 50 | 30 days supply | Qty: 60 | Fill #2

## 2016-03-12 ENCOUNTER — Encounter: Payer: BLUE CROSS/BLUE SHIELD | Admitting: Internal Medicine

## 2016-03-30 LAB — HM DIABETES EYE EXAM

## 2016-04-02 MED FILL — VICTOZA 2-PAK 18 MG/3 ML PE: 18 | 30 days supply | Qty: 6 | Fill #2

## 2016-04-02 MED FILL — ATORVASTATIN 10 MG TABLET: 10 | 30 days supply | Qty: 15 | Fill #2

## 2016-04-02 MED FILL — LOSARTAN-HCTZ 100-25 MG TAB: 100-25 | 30 days supply | Qty: 30 | Fill #3

## 2016-04-02 MED FILL — METOPROLOL TARTRATE 50 MG T: 50 | 30 days supply | Qty: 60 | Fill #3

## 2016-04-02 MED FILL — CIALIS 20 MG TABLET: 20 | 30 days supply | Qty: 6 | Fill #2

## 2016-04-06 ENCOUNTER — Encounter: Payer: Self-pay | Admitting: Internal Medicine

## 2016-04-26 ENCOUNTER — Other Ambulatory Visit: Payer: Self-pay | Admitting: Internal Medicine

## 2016-04-26 ENCOUNTER — Encounter: Payer: Self-pay | Admitting: Internal Medicine

## 2016-04-26 MED FILL — PIOGLITAZONE HCL 30 MG TAB: 30 | 30 days supply | Qty: 15 | Fill #0

## 2016-05-04 ENCOUNTER — Other Ambulatory Visit: Payer: Self-pay | Admitting: Internal Medicine

## 2016-05-04 MED FILL — NOVOFINE 32G NEEDLES: 32G X 6 MM | 90 days supply | Qty: 100 | Fill #2

## 2016-05-04 MED FILL — METOPROLOL TARTRATE 50 MG T: 50 | 30 days supply | Qty: 60 | Fill #0

## 2016-05-04 MED FILL — LOSARTAN-HCTZ 100-25 MG TAB: 100-25 | 30 days supply | Qty: 30 | Fill #0

## 2016-05-04 MED FILL — VICTOZA 2-PAK 18 MG/3 ML PE: 18 | 30 days supply | Qty: 6 | Fill #3

## 2016-05-04 MED FILL — ATORVASTATIN 10 MG TABLET: 10 | 30 days supply | Qty: 15 | Fill #3

## 2016-05-07 LAB — BASIC METABOLIC PANEL
BUN: 21 mg/dL (ref 4–21)
CREATININE: 1.7 mg/dL — AB (ref 0.6–1.3)
Glucose: 115 mg/dL
POTASSIUM: 4.6 mmol/L (ref 3.4–5.3)
Sodium: 134 mmol/L — AB (ref 137–147)

## 2016-05-13 ENCOUNTER — Encounter: Payer: Self-pay | Admitting: Internal Medicine

## 2016-05-24 MED FILL — PIOGLITAZONE HCL 30 MG TAB: 30 | 30 days supply | Qty: 15 | Fill #1

## 2016-06-01 ENCOUNTER — Encounter: Payer: BLUE CROSS/BLUE SHIELD | Admitting: Internal Medicine

## 2016-06-02 MED FILL — CIALIS 20 MG TABLET: 20 | 30 days supply | Qty: 6 | Fill #3

## 2016-06-02 MED FILL — ATORVASTATIN 10 MG TABLET: 10 | 30 days supply | Qty: 15 | Fill #4

## 2016-06-02 MED FILL — LOSARTAN-HCTZ 100-25 MG TAB: 100-25 | 30 days supply | Qty: 30 | Fill #1

## 2016-06-02 MED FILL — METOPROLOL TARTRATE 50 MG T: 50 | 30 days supply | Qty: 60 | Fill #1

## 2016-06-02 MED FILL — VICTOZA 2-PAK 18 MG/3 ML PE: 18 | 30 days supply | Qty: 6 | Fill #4

## 2016-06-11 ENCOUNTER — Ambulatory Visit (INDEPENDENT_AMBULATORY_CARE_PROVIDER_SITE_OTHER): Payer: BLUE CROSS/BLUE SHIELD | Admitting: Internal Medicine

## 2016-06-11 ENCOUNTER — Encounter: Payer: Self-pay | Admitting: Internal Medicine

## 2016-06-11 VITALS — BP 128/80 | HR 69 | Temp 98.0°F | Resp 14 | Ht 70.0 in | Wt 300.5 lb

## 2016-06-11 DIAGNOSIS — Z0000 Encounter for general adult medical examination without abnormal findings: Secondary | ICD-10-CM | POA: Diagnosis not present

## 2016-06-11 DIAGNOSIS — Z23 Encounter for immunization: Secondary | ICD-10-CM | POA: Diagnosis not present

## 2016-06-11 DIAGNOSIS — E118 Type 2 diabetes mellitus with unspecified complications: Secondary | ICD-10-CM

## 2016-06-11 LAB — CBC WITH DIFFERENTIAL/PLATELET
BASOS PCT: 0 %
Basophils Absolute: 0 cells/uL (ref 0–200)
EOS ABS: 420 {cells}/uL (ref 15–500)
Eosinophils Relative: 6 %
HEMATOCRIT: 43.2 % (ref 38.5–50.0)
Hemoglobin: 14.6 g/dL (ref 13.2–17.1)
LYMPHS ABS: 1610 {cells}/uL (ref 850–3900)
Lymphocytes Relative: 23 %
MCH: 31.8 pg (ref 27.0–33.0)
MCHC: 33.8 g/dL (ref 32.0–36.0)
MCV: 94.1 fL (ref 80.0–100.0)
MONO ABS: 490 {cells}/uL (ref 200–950)
MPV: 8.9 fL (ref 7.5–12.5)
Monocytes Relative: 7 %
NEUTROS ABS: 4480 {cells}/uL (ref 1500–7800)
Neutrophils Relative %: 64 %
PLATELETS: 219 10*3/uL (ref 140–400)
RBC: 4.59 MIL/uL (ref 4.20–5.80)
RDW: 13.4 % (ref 11.0–15.0)
WBC: 7 10*3/uL (ref 3.8–10.8)

## 2016-06-11 LAB — TSH: TSH: 1.14 m[IU]/L (ref 0.40–4.50)

## 2016-06-11 LAB — AST: AST: 14 U/L (ref 10–35)

## 2016-06-11 LAB — HEMOGLOBIN A1C
Hgb A1c MFr Bld: 5.8 % — ABNORMAL HIGH (ref <5.7)
MEAN PLASMA GLUCOSE: 120 mg/dL

## 2016-06-11 LAB — ALT: ALT: 15 U/L (ref 9–46)

## 2016-06-11 NOTE — Patient Instructions (Signed)
GO TO THE LAB : Get the blood work     GO TO THE FRONT DESK Schedule your next appointment for a  Routine check in 5-6 months   Diabetes and Foot Care Diabetes may cause you to have problems because of poor blood supply (circulation) to your feet and legs. This may cause the skin on your feet to become thinner, break easier, and heal more slowly. Your skin may become dry, and the skin may peel and crack. You may also have nerve damage in your legs and feet causing decreased feeling in them. You may not notice minor injuries to your feet that could lead to infections or more serious problems. Taking care of your feet is one of the most important things you can do for yourself.  HOME CARE INSTRUCTIONS  Wear shoes at all times, even in the house. Do not go barefoot. Bare feet are easily injured.  Check your feet daily for blisters, cuts, and redness. If you cannot see the bottom of your feet, use a mirror or ask someone for help.  Wash your feet with warm water (do not use hot water) and mild soap. Then pat your feet and the areas between your toes until they are completely dry. Do not soak your feet as this can dry your skin.  Apply a moisturizing lotion or petroleum jelly (that does not contain alcohol and is unscented) to the skin on your feet and to dry, brittle toenails. Do not apply lotion between your toes.  Trim your toenails straight across. Do not dig under them or around the cuticle. File the edges of your nails with an emery board or nail file.  Do not cut corns or calluses or try to remove them with medicine.  Wear clean socks or stockings every day. Make sure they are not too tight. Do not wear knee-high stockings since they may decrease blood flow to your legs.  Wear shoes that fit properly and have enough cushioning. To break in new shoes, wear them for just a few hours a day. This prevents you from injuring your feet. Always look in your shoes before you put them on to be sure  there are no objects inside.  Do not cross your legs. This may decrease the blood flow to your feet.  If you find a minor scrape, cut, or break in the skin on your feet, keep it and the skin around it clean and dry. These areas may be cleansed with mild soap and water. Do not cleanse the area with peroxide, alcohol, or iodine.  When you remove an adhesive bandage, be sure not to damage the skin around it.  If you have a wound, look at it several times a day to make sure it is healing.  Do not use heating pads or hot water bottles. They may burn your skin. If you have lost feeling in your feet or legs, you may not know it is happening until it is too late.  Make sure your health care provider performs a complete foot exam at least annually or more often if you have foot problems. Report any cuts, sores, or bruises to your health care provider immediately. SEEK MEDICAL CARE IF:   You have an injury that is not healing.  You have cuts or breaks in the skin.  You have an ingrown nail.  You notice redness on your legs or feet.  You feel burning or tingling in your legs or feet.  You have  pain or cramps in your legs and feet.  Your legs or feet are numb.  Your feet always feel cold. SEEK IMMEDIATE MEDICAL CARE IF:   There is increasing redness, swelling, or pain in or around a wound.  There is a red line that goes up your leg.  Pus is coming from a wound.  You develop a fever or as directed by your health care provider.  You notice a bad smell coming from an ulcer or wound.   This information is not intended to replace advice given to you by your health care provider. Make sure you discuss any questions you have with your health care provider.   Document Released: 08/13/2000 Document Revised: 04/18/2013 Document Reviewed: 01/23/2013 Elsevier Interactive Patient Education [employer].

## 2016-06-11 NOTE — Progress Notes (Signed)
Subjective:    Patient ID: Jimmy [name], male    DOB: Jan 19, 1951, 65 y.o.   MRN: [id number]  DOS:  06/11/2016 Type of visit - description : cpx Interval history:Doing well, no major concerns, good medication compliance  Wt Readings from Last 3 Encounters:  06/11/16 (!) 300 lb 8 oz (136.3 kg)  01/13/16 (!) 302 lb 6 oz (137.2 kg)  07/11/15 (!) 305 lb (138.3 kg)     Review of Systems Constitutional: No fever. No chills. No unexplained wt changes. No unusual sweats  HEENT: No dental problems, no ear discharge, no facial swelling, no voice changes. No eye discharge, no eye  redness , no  intolerance to light   Respiratory: No wheezing , no  difficulty breathing. No cough , no mucus production  Cardiovascular: No CP, no leg swelling , no  Palpitations  GI: no nausea, no vomiting, no diarrhea , no  abdominal pain.  No blood in the stools. No dysphagia, no odynophagia    Endocrine: No polyphagia, no polyuria , no polydipsia  GU: No dysuria, gross hematuria, difficulty urinating. No urinary urgency, no frequency.  Musculoskeletal: No joint swellings or unusual aches or pains  Skin: No change in the color of the skin, palor , no  Rash  Allergic, immunologic: No environmental allergies , no  food allergies  Neurological: No dizziness no  syncope. No headaches. No diplopia, no slurred, no slurred speech, no motor deficits, no facial  Numbness. No lower extremity paresthesias or numbness  Hematological: No enlarged lymph nodes, no easy bruising , no unusual bleedings  Psychiatry: No suicidal ideas, no hallucinations, no beavior problems, no confusion.  No unusual/severe anxiety, no depression   Past Medical History:  Diagnosis Date  . BCC (basal cell carcinoma of skin) 2013   dr Allyson Sabal   . CKD (chronic kidney disease), stage III   . Diabetes mellitus   . Hypertension   . Obesity   . Renal insufficiency    see nephrology yearly     Past Surgical History:  Procedure  Laterality Date  . pilonidal cyst surgery    . POLYPECTOMY      Social History   Social History  . Marital status: Married    Spouse name: N/A  . Number of children: 4  . Years of education: N/A   Occupational History  . 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 Smoking status: Never Smoker  . Smokeless tobacco: Never Used  . Alcohol use 0.0 oz/week     Comment: 6 beers a year -rare   . Drug use: No  . Sexual activity: Not on file   Other Topics Concern  . Not on file   Social History Narrative   Household- pt, wife, his daughter and 2 G-children   Grown children , 6 g-children              Family History  Problem Relation Age of Onset  . Kidney failure Father   . Heart attack      GF 23s y/o  . Cancer Mother     type  . Colon cancer Neg Hx   . Prostate cancer Neg Hx   . Diabetes Neg Hx   . Colon polyps Neg Hx   . Rectal cancer Neg Hx   . Stomach cancer Neg Hx   . Thyroid cancer Neg Hx        Medication List  Accurate as of 06/11/16 11:59 PM. Always use your most recent med list.          amLODipine 10 MG tablet Commonly known as:  NORVASC Take 5 mg by mouth daily. Reported on 01/13/2016   aspirin 81 MG tablet Take 81 mg by mouth daily. Reported on 01/13/2016   atorvastatin 10 MG tablet Commonly known as:  LIPITOR Take 0.5 tablets (5 mg total) by mouth daily.   CIALIS 20 MG tablet Generic drug:  tadalafil TAKE 1 TABLET (20 MG TOTAL) BY MOUTH EVERY OTHER DAY AS NEEDED FOR ERECTILE DYSFUNCTION.   Garlic [id number] MG Caps Take 2,000 mg by mouth daily.   GLUCOSAMINE CHONDROITIN JOINT PO Take 1,500 mg by mouth daily.   hydrocortisone 2.5 % cream Apply topically 2 (two) times daily.   Insulin Pen Needle 32G X 6 MM Misc Commonly known as:  NOVOFINE Use to inject Victoza   liraglutide 18 MG/3ML Sopn Commonly known as:  VICTOZA Inject 0.2 mLs (1.2 mg total) into the skin daily.   losartan-hydrochlorothiazide  100-25 MG tablet Commonly known as:  HYZAAR Take 1 tablet by mouth daily.   metoprolol 50 MG tablet Commonly known as:  LOPRESSOR Take 1 tablet (50 mg total) by mouth 2 (two) times daily.   pioglitazone 30 MG tablet Commonly known as:  ACTOS Take 0.5 tablets (15 mg total) by mouth daily.   Potassium 99 MG Tabs Take 99 mg by mouth daily.   PREBIOTIC PRODUCT PO Take 1 scoop by mouth daily.   Vitamin D3 1000 units Caps Take 1,000 Units by mouth daily.          Objective:   Physical Exam BP 128/80 (BP Location: Left Arm, Patient Position: Sitting, Cuff Size: Normal)   Pulse 69   Temp 98 F (36.7 C) (Oral)   Resp 14   Ht 5\' 10"  (1.778 m)   Wt (!) 300 lb 8 oz (136.3 kg)   SpO2 97%   BMI 43.12 kg/m   General:   Well developed, well nourished . NAD.  Neck: No  thyromegaly  HEENT:  Normocephalic . Face symmetric, atraumatic Lungs:  CTA B Normal respiratory effort, no intercostal retractions, no accessory muscle use. Heart: RRR,  no murmur.  No pretibial edema bilaterally  Abdomen:  Not distended, soft, non-tender. No rebound or rigidity.   DIABETIC FEET EXAM: No lower extremity edema Normal pedal pulses bilaterally Skin normal, nails dystrophic Pinprick examination : Slightly decreased sensitivity at the sides of both great toes  Skin: Exposed areas without rash. Not pale. Not jaundice Neurologic:  alert & oriented X3.  Speech normal, gait appropriate for age and unassisted Strength symmetric and appropriate for age.  Psych: Cognition and judgment appear intact.  Cooperative with normal attention span and concentration.  Behavior appropriate. No anxious or depressed appearing.    Assessment & Plan:   Assessment> DM -- used to be on glimepiride, dc after he lost weight Neuropathy: DX 05-2016, feet care discussed HTN Obesity Renal insufficiency, sees nephrology, Creat ~1.9 BCC 2013 Dr. Allyson Sabal DOE 2013, saw cards echo diastolic dysfx,Rx---weight loss and  exercise LE pain, 2013: CKs normal, vascular exam was normal  PLAN: DM: Continue Victoza, Actos, check A1c. Ambulatory blood sugars in the morning 120s. Doing well with diet. Neuropathy: noted on  Today's exam. Feet care discussed HTN: Continue Hyzaar, ambulatory BPs around 120/76 ; last BMP stable. RTC 5-6 months

## 2016-06-11 NOTE — Assessment & Plan Note (Addendum)
Td 08;  pneumonia shot 09/26/09;  prevnar 2015;  zostavax 2014. 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 Prostate cancer screening:  DRE- PSA wnl 2016   Diet and exercise:  Diet has improved,eating  vegetarian, lost some weight ,

## 2016-06-11 NOTE — Progress Notes (Signed)
Pre visit review using our clinic review tool, if applicable. No additional management support is needed unless otherwise documented below in the visit note. 

## 2016-06-13 NOTE — Assessment & Plan Note (Addendum)
DM: Continue Victoza, Actos, check A1c. Ambulatory blood sugars in the morning 120s. Doing well with diet. Neuropathy: noted on  Today's exam. Feet care discussed HTN: Continue Hyzaar, ambulatory BPs around 120/76 ; last BMP stable. RTC 5-6 months

## 2016-06-28 MED FILL — PIOGLITAZONE HCL 30 MG TAB: 30 | 30 days supply | Qty: 15 | Fill #2

## 2016-07-02 MED FILL — ATORVASTATIN 10 MG TABLET: 10 | 30 days supply | Qty: 15 | Fill #5

## 2016-07-02 MED FILL — VICTOZA 2-PAK 18 MG/3 ML PE: 18 | 30 days supply | Qty: 6 | Fill #5

## 2016-07-02 MED FILL — METOPROLOL TARTRATE 50 MG T: 50 | 30 days supply | Qty: 60 | Fill #2

## 2016-07-02 MED FILL — LOSARTAN-HCTZ 100-25 MG TAB: 100-25 | 30 days supply | Qty: 30 | Fill #2

## 2016-07-12 MED FILL — NOVOFINE 32G NEEDLES: 32G X 6 MM | 90 days supply | Qty: 100 | Fill #3

## 2016-07-26 ENCOUNTER — Other Ambulatory Visit: Payer: Self-pay | Admitting: Internal Medicine

## 2016-07-26 MED FILL — CIALIS 20 MG TABLET: 20 | 30 days supply | Qty: 6 | Fill #0

## 2016-07-26 MED FILL — PIOGLITAZONE HCL 30 MG TAB: 30 | 30 days supply | Qty: 15 | Fill #3

## 2016-07-30 ENCOUNTER — Other Ambulatory Visit: Payer: Self-pay | Admitting: Internal Medicine

## 2016-07-30 MED FILL — VICTOZA 2-PAK 18 MG/3 ML PE: 18 | 30 days supply | Qty: 6 | Fill #6

## 2016-07-30 MED FILL — METOPROLOL TARTRATE 50 MG T: 50 | 30 days supply | Qty: 60 | Fill #3

## 2016-07-30 MED FILL — ATORVASTATIN 10 MG TABLET: 10 | 30 days supply | Qty: 15 | Fill #0

## 2016-07-30 MED FILL — LOSARTAN-HCTZ 100-25 MG TAB: 100-25 | 30 days supply | Qty: 30 | Fill #3

## 2016-08-26 ENCOUNTER — Other Ambulatory Visit: Payer: Self-pay | Admitting: Internal Medicine

## 2016-08-26 MED FILL — PIOGLITAZONE HCL 30 MG TAB: 30 | 30 days supply | Qty: 15 | Fill #0

## 2016-08-30 MED FILL — LOSARTAN-HCTZ 100-25 MG TAB: 100-25 | 30 days supply | Qty: 30 | Fill #4

## 2016-08-30 MED FILL — ATORVASTATIN 10 MG TABLET: 10 | 30 days supply | Qty: 15 | Fill #1

## 2016-08-30 MED FILL — VICTOZA 2-PAK 18 MG/3 ML PE: 18 | 30 days supply | Qty: 6 | Fill #7

## 2016-08-30 MED FILL — METOPROLOL TARTRATE 50 MG T: 50 | 30 days supply | Qty: 60 | Fill #4

## 2016-09-27 MED FILL — PIOGLITAZONE HCL 30 MG TAB: 30 | 30 days supply | Qty: 15 | Fill #1

## 2016-09-29 MED FILL — ATORVASTATIN 10 MG TABLET: 10 | 30 days supply | Qty: 15 | Fill #2

## 2016-09-29 MED FILL — LOSARTAN-HCTZ 100-25 MG TAB: 100-25 | 30 days supply | Qty: 30 | Fill #5

## 2016-09-29 MED FILL — VICTOZA 2-PAK 18 MG/3 ML PE: 18 | 30 days supply | Qty: 6 | Fill #8

## 2016-09-29 MED FILL — METOPROLOL TARTRATE 50 MG T: 50 | 30 days supply | Qty: 60 | Fill #5

## 2016-10-25 MED FILL — PIOGLITAZONE HCL 30 MG TAB: 30 | 30 days supply | Qty: 15 | Fill #2

## 2016-10-29 ENCOUNTER — Other Ambulatory Visit: Payer: Self-pay | Admitting: Internal Medicine

## 2016-10-29 MED FILL — METOPROLOL TARTRATE 50 MG T: 50 | 30 days supply | Qty: 60 | Fill #0

## 2016-10-29 MED FILL — VICTOZA 2-PAK 18 MG/3 ML PE: 18 | 60 days supply | Qty: 12 | Fill #0

## 2016-10-29 MED FILL — NOVOFINE 32G NEEDLES: 32G X 6 MM | 90 days supply | Qty: 100 | Fill #0

## 2016-10-29 MED FILL — ATORVASTATIN 10 MG TABLET: 10 | 30 days supply | Qty: 15 | Fill #3

## 2016-10-29 MED FILL — LOSARTAN-HCTZ 100-25 MG TAB: 100-25 | 30 days supply | Qty: 30 | Fill #0

## 2016-11-25 MED FILL — PIOGLITAZONE HCL 30 MG TAB: 30 | 30 days supply | Qty: 15 | Fill #3

## 2016-11-29 MED FILL — LOSARTAN-HCTZ 100-25 MG TAB: 100-25 | 30 days supply | Qty: 30 | Fill #1

## 2016-11-29 MED FILL — CIALIS 20 MG TABLET: 20 | 30 days supply | Qty: 6 | Fill #1

## 2016-11-29 MED FILL — METOPROLOL TARTRATE 50 MG T: 50 | 30 days supply | Qty: 60 | Fill #1

## 2016-11-29 MED FILL — ATORVASTATIN 10 MG TABLET: 10 | 30 days supply | Qty: 15 | Fill #4

## 2016-12-10 ENCOUNTER — Encounter: Payer: Self-pay | Admitting: Internal Medicine

## 2016-12-10 ENCOUNTER — Ambulatory Visit (INDEPENDENT_AMBULATORY_CARE_PROVIDER_SITE_OTHER): Payer: BLUE CROSS/BLUE SHIELD | Admitting: Internal Medicine

## 2016-12-10 VITALS — BP 122/84 | HR 68 | Temp 98.0°F | Resp 14 | Ht 70.0 in | Wt 305.4 lb

## 2016-12-10 DIAGNOSIS — I10 Essential (primary) hypertension: Secondary | ICD-10-CM | POA: Diagnosis not present

## 2016-12-10 DIAGNOSIS — E118 Type 2 diabetes mellitus with unspecified complications: Secondary | ICD-10-CM | POA: Diagnosis not present

## 2016-12-10 DIAGNOSIS — Z23 Encounter for immunization: Secondary | ICD-10-CM | POA: Diagnosis not present

## 2016-12-10 LAB — LIPID PANEL
CHOLESTEROL: 117 mg/dL (ref 0–200)
HDL: 39.2 mg/dL (ref >39.00)
LDL CALC: 43 mg/dL (ref 0–99)
NonHDL: 77.98
Total CHOL/HDL Ratio: 3
Triglycerides: 173 mg/dL — ABNORMAL HIGH (ref 0.0–149.0)
VLDL: 34.6 mg/dL (ref 0.0–40.0)

## 2016-12-10 LAB — BASIC METABOLIC PANEL
BUN: 24 mg/dL — ABNORMAL HIGH (ref 6–23)
CHLORIDE: 101 meq/L (ref 96–112)
CO2: 25 mEq/L (ref 19–32)
CREATININE: 1.59 mg/dL — AB (ref 0.40–1.50)
Calcium: 9.6 mg/dL (ref 8.4–10.5)
GFR: 46.64 mL/min — ABNORMAL LOW (ref >60.00)
Glucose, Bld: 129 mg/dL — ABNORMAL HIGH (ref 70–99)
POTASSIUM: 4.3 meq/L (ref 3.5–5.1)
Sodium: 135 mEq/L (ref 135–145)

## 2016-12-10 LAB — HEMOGLOBIN A1C: Hgb A1c MFr Bld: 6.6 % — ABNORMAL HIGH (ref 4.6–6.5)

## 2016-12-10 NOTE — Patient Instructions (Signed)
GO TO THE LAB : Get the blood work     GO TO THE FRONT DESK Schedule your next appointment for a  physical exam by October 2018

## 2016-12-10 NOTE — Progress Notes (Signed)
Subjective:    Patient ID: Jimmy [name], male    DOB: 07-06-1951, 66 y.o.   MRN: [id number]  DOS:  12/10/2016 Type of visit - description : Routine checkup  Interval history: No major concerns, good medication compliance, ambulatory BPs 118/72, CBGs when check in the low 100s.  Wt Readings from Last 3 Encounters:  12/10/16 (!) 305 lb 6 oz (138.5 kg)  06/11/16 (!) 300 lb 8 oz (136.3 kg)  01/13/16 (!) 302 lb 6 oz (137.2 kg)     Review of Systems Diet has changed to veggan. Although has not change his wt much, he is feeling very well; since he changes his diet aches and pains are less noticeable. Denies  lower extremity paresthesias.  Past Medical History:  Diagnosis Date  . BCC (basal cell carcinoma of skin) 2013   dr Allyson Sabal   . CKD (chronic kidney disease), stage III   . Diabetes mellitus   . Hypertension   . Obesity   . Renal insufficiency    see nephrology yearly     Past Surgical History:  Procedure Laterality Date  . pilonidal cyst surgery    . POLYPECTOMY      Social History   Social History  . Marital status: Married    Spouse name: N/A  . Number of children: 4  . Years of education: N/A   Occupational History  . 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 Smoking status: Never Smoker  . Smokeless tobacco: Never Used  . Alcohol use 0.0 oz/week     Comment: 6 beers a year -rare   . Drug use: No  . Sexual activity: Not on file   Other Topics Concern  . Not on file   Social History Narrative   Household- pt, wife, his daughter and 2 G-children   Grown children , 6 g-children               Allergies as of 12/10/2016      Reactions   Benazepril Hcl    REACTION: cough   Valsartan Other (See Comments)   Unknown reaction      Medication List       Accurate as of 12/10/16 11:59 PM. Always use your most recent med list.          amLODipine 10 MG tablet Commonly known as:  NORVASC Take 5 mg by mouth daily. Reported  on 01/13/2016   aspirin 81 MG tablet Take 81 mg by mouth daily. Reported on 01/13/2016   atorvastatin 10 MG tablet Commonly known as:  LIPITOR Take 0.5 tablets (5 mg total) by mouth daily.   CIALIS 20 MG tablet Generic drug:  tadalafil TAKE 1 TABLET (20 MG TOTAL) BY MOUTH EVERY OTHER DAY AS NEEDED FOR ERECTILE DYSFUNCTION.   Garlic 2542 MG Caps Take 2,000 mg by mouth daily.   GLUCOSAMINE CHONDROITIN JOINT PO Take 1,500 mg by mouth daily.   hydrocortisone 2.5 % cream Apply topically 2 (two) times daily.   liraglutide 18 MG/3ML Sopn Commonly known as:  VICTOZA Inject 0.2 mLs (1.2 mg total) into the skin daily.   losartan-hydrochlorothiazide 100-25 MG tablet Commonly known as:  HYZAAR Take 1 tablet by mouth daily.   metoprolol 50 MG tablet Commonly known as:  LOPRESSOR Take 1 tablet (50 mg total) by mouth 2 (two) times daily.   NOVOFINE 32G X 6 MM Misc Generic drug:  Insulin Pen Needle USE TO INJECT VICTOZA  pioglitazone 30 MG tablet Commonly known as:  ACTOS Take 0.5 tablets (15 mg total) by mouth daily.   Potassium 99 MG Tabs Take 99 mg by mouth daily.   PREBIOTIC PRODUCT PO Take 1 scoop by mouth daily.   Vitamin D3 1000 units Caps Take 1,000 Units by mouth daily.          Objective:   Physical Exam BP 122/84 (BP Location: Left Arm, Patient Position: Sitting, Cuff Size: Normal)   Pulse 68   Temp 98 F (36.7 C) (Oral)   Resp 14   Ht 5\' 10"  (1.778 m)   Wt (!) 305 lb 6 oz (138.5 kg)   SpO2 96%   BMI 43.82 kg/m  General:   Well developed, well nourished . NAD.  HEENT:  Normocephalic . Face symmetric, atraumatic Lungs:  CTA B Normal respiratory effort, no intercostal retractions, no accessory muscle use. Heart: RRR,  no murmur.  No pretibial edema bilaterally  Skin: Not pale. Not jaundice Neurologic:  alert & oriented X3.  Speech normal, gait appropriate for age and unassisted Psych--  Cognition and judgment appear intact.  Cooperative with  normal attention span and concentration.  Behavior appropriate. No anxious or depressed appearing.      Assessment & Plan:   Assessment  DM -- used to be on glimepiride, dc after he lost weight Neuropathy: DX 05-2016, feet care discussed HTN Morbid Obesity Renal insufficiency, sees nephrology, Creat ~1.9 BCC 2013 Dr. Allyson Sabal DOE 2013, saw cards echo diastolic dysfx,Rx---weight loss and exercise LE pain, 2013: CKs normal, vascular exam was normal  PLAN: DM: Doing well with diet, planning to go back to some exercise. Will check a FLP and A1c, If Numbers Look Good ,  cont w/ Victoza and Stop Actos ? HTN: Seems Well-Controlled, Continue Hyzaar, amlodipine, metoprolol. Check a BMP. Td  today RTC 10-18 CPX

## 2016-12-10 NOTE — Progress Notes (Signed)
Pre visit review using our clinic review tool, if applicable. No additional management support is needed unless otherwise documented below in the visit note. 

## 2016-12-12 NOTE — Assessment & Plan Note (Addendum)
DM: Doing well with diet, planning to go back to some exercise. Will check a FLP and A1c, If Numbers Look Good ,  cont w/ Victoza and Stop Actos ? HTN: Seems Well-Controlled, Continue Hyzaar, amlodipine, metoprolol. Check a BMP. Td  today RTC 10-18 CPX

## 2016-12-23 MED FILL — PIOGLITAZONE HCL 30 MG TAB: 30 | 30 days supply | Qty: 15 | Fill #4

## 2016-12-27 MED FILL — VICTOZA 2-PAK 18 MG/3 ML PE: 18 | 60 days supply | Qty: 12 | Fill #1

## 2016-12-27 MED FILL — METOPROLOL TARTRATE 50 MG T: 50 | 30 days supply | Qty: 60 | Fill #2

## 2016-12-27 MED FILL — LOSARTAN-HCTZ 100-25 MG TAB: 100-25 | 30 days supply | Qty: 30 | Fill #2

## 2016-12-27 MED FILL — ATORVASTATIN 10 MG TABLET: 10 | 30 days supply | Qty: 15 | Fill #5

## 2017-01-17 MED FILL — PIOGLITAZONE HCL 30 MG TAB: 30 | 30 days supply | Qty: 15 | Fill #5

## 2017-01-17 MED FILL — NOVOFINE 32G NEEDLES: 32G X 6 MM | 90 days supply | Qty: 100 | Fill #1

## 2017-01-20 ENCOUNTER — Other Ambulatory Visit: Payer: Self-pay | Admitting: Internal Medicine

## 2017-01-20 MED FILL — METOPROLOL TARTRATE 50 MG T: 50 | 30 days supply | Qty: 60 | Fill #3

## 2017-01-20 MED FILL — LOSARTAN-HCTZ 100-25 MG TAB: 100-25 | 30 days supply | Qty: 30 | Fill #3

## 2017-01-20 MED FILL — ATORVASTATIN 10 MG TABLET: 10 | 30 days supply | Qty: 15 | Fill #0

## 2017-02-21 ENCOUNTER — Other Ambulatory Visit: Payer: Self-pay | Admitting: Internal Medicine

## 2017-02-21 MED FILL — LOSARTAN-HCTZ 100-25 MG TAB: 100-25 | 30 days supply | Qty: 30 | Fill #4

## 2017-02-21 MED FILL — VICTOZA 2-PAK 18 MG/3 ML PE: 18 | 30 days supply | Qty: 6 | Fill #2

## 2017-02-21 MED FILL — PIOGLITAZONE HCL 30 MG TAB: 30 | 30 days supply | Qty: 15 | Fill #0

## 2017-02-21 MED FILL — ATORVASTATIN 10 MG TABLET: 10 | 30 days supply | Qty: 15 | Fill #1

## 2017-02-21 MED FILL — METOPROLOL TARTRATE 50 MG T: 50 | 30 days supply | Qty: 60 | Fill #4

## 2017-03-23 MED FILL — METOPROLOL TARTRATE 50 MG T: 50 | 30 days supply | Qty: 60 | Fill #5

## 2017-03-23 MED FILL — LOSARTAN-HCTZ 100-25 MG TAB: 100-25 | 30 days supply | Qty: 30 | Fill #5

## 2017-03-23 MED FILL — ATORVASTATIN 10 MG TABLET: 10 | 30 days supply | Qty: 15 | Fill #2

## 2017-03-23 MED FILL — PIOGLITAZONE HCL 30 MG TAB: 30 | 30 days supply | Qty: 15 | Fill #1

## 2017-04-06 LAB — HM DIABETES EYE EXAM

## 2017-04-22 ENCOUNTER — Other Ambulatory Visit: Payer: Self-pay | Admitting: Internal Medicine

## 2017-04-22 MED FILL — LOSARTAN-HCTZ 100-25 MG TAB: 100-25 | 30 days supply | Qty: 30 | Fill #0

## 2017-04-22 MED FILL — METOPROLOL TARTRATE 50 MG T: 50 | 30 days supply | Qty: 60 | Fill #0

## 2017-04-22 MED FILL — ATORVASTATIN 10 MG TABLET: 10 | 30 days supply | Qty: 15 | Fill #3

## 2017-04-22 MED FILL — VICTOZA 2-PAK 18 MG/3 ML PE: 18 | 30 days supply | Qty: 6 | Fill #0

## 2017-04-22 MED FILL — PIOGLITAZONE HCL 30 MG TAB: 30 | 30 days supply | Qty: 15 | Fill #2

## 2017-04-29 LAB — BASIC METABOLIC PANEL
BUN: 19 (ref 4–21)
CREATININE: 1.7 — AB (ref 0.6–1.3)
GLUCOSE: 118
POTASSIUM: 4.9 (ref 3.4–5.3)
SODIUM: 135 — AB (ref 137–147)

## 2017-05-12 ENCOUNTER — Encounter: Payer: Self-pay | Admitting: Internal Medicine

## 2017-05-12 MED FILL — NOVOFINE 32G NEEDLES: 32G X 6 MM | 30 days supply | Qty: 100 | Fill #2

## 2017-05-23 MED FILL — ATORVASTATIN 10 MG TABLET: 10 | 30 days supply | Qty: 15 | Fill #4

## 2017-05-23 MED FILL — METOPROLOL TARTRATE 50 MG T: 50 | 30 days supply | Qty: 60 | Fill #1

## 2017-05-23 MED FILL — PIOGLITAZONE HCL 30 MG TAB: 30 | 30 days supply | Qty: 15 | Fill #3

## 2017-05-23 MED FILL — LOSARTAN-HCTZ 100-25 MG TAB: 100-25 | 30 days supply | Qty: 30 | Fill #1

## 2017-05-30 MED FILL — VICTOZA 2-PAK 18 MG/3 ML PE: 18 | 30 days supply | Qty: 6 | Fill #1

## 2017-06-17 ENCOUNTER — Encounter: Payer: Self-pay | Admitting: Internal Medicine

## 2017-06-17 ENCOUNTER — Ambulatory Visit (HOSPITAL_BASED_OUTPATIENT_CLINIC_OR_DEPARTMENT_OTHER)
Admission: RE | Admit: 2017-06-17 | Discharge: 2017-06-17 | Disposition: A | Discharge status: Home / Self Care | Payer: BLUE CROSS/BLUE SHIELD | Source: Ambulatory Visit | Attending: Internal Medicine | Admitting: Internal Medicine

## 2017-06-17 ENCOUNTER — Ambulatory Visit (INDEPENDENT_AMBULATORY_CARE_PROVIDER_SITE_OTHER): Payer: BLUE CROSS/BLUE SHIELD | Admitting: Internal Medicine

## 2017-06-17 VITALS — BP 118/68 | HR 68 | Temp 98.1°F | Resp 14 | Ht 70.0 in | Wt 309.2 lb

## 2017-06-17 DIAGNOSIS — R918 Other nonspecific abnormal finding of lung field: Secondary | ICD-10-CM | POA: Insufficient documentation

## 2017-06-17 DIAGNOSIS — R0609 Other forms of dyspnea: Secondary | ICD-10-CM

## 2017-06-17 DIAGNOSIS — Z23 Encounter for immunization: Secondary | ICD-10-CM

## 2017-06-17 DIAGNOSIS — J9811 Atelectasis: Secondary | ICD-10-CM | POA: Insufficient documentation

## 2017-06-17 DIAGNOSIS — E118 Type 2 diabetes mellitus with unspecified complications: Secondary | ICD-10-CM | POA: Diagnosis not present

## 2017-06-17 DIAGNOSIS — Z0000 Encounter for general adult medical examination without abnormal findings: Secondary | ICD-10-CM

## 2017-06-17 LAB — CBC WITH DIFFERENTIAL/PLATELET
BASOS PCT: 0.9 % (ref 0.0–3.0)
Basophils Absolute: 0.1 10*3/uL (ref 0.0–0.1)
EOS PCT: 4.2 % (ref 0.0–5.0)
Eosinophils Absolute: 0.3 10*3/uL (ref 0.0–0.7)
HCT: 43.9 % (ref 39.0–52.0)
HEMOGLOBIN: 14.6 g/dL (ref 13.0–17.0)
Lymphocytes Relative: 18.3 % (ref 12.0–46.0)
Lymphs Abs: 1.2 10*3/uL (ref 0.7–4.0)
MCHC: 33.2 g/dL (ref 30.0–36.0)
MCV: 97.7 fl (ref 78.0–100.0)
MONO ABS: 0.5 10*3/uL (ref 0.1–1.0)
Monocytes Relative: 8.3 % (ref 3.0–12.0)
Neutro Abs: 4.5 10*3/uL (ref 1.4–7.7)
Neutrophils Relative %: 68.3 % (ref 43.0–77.0)
Platelets: 311 10*3/uL (ref 150.0–400.0)
RBC: 4.49 Mil/uL (ref 4.22–5.81)
RDW: 13.5 % (ref 11.5–15.5)
WBC: 6.6 10*3/uL (ref 4.0–10.5)

## 2017-06-17 LAB — TSH: TSH: 1.75 u[IU]/mL (ref 0.35–4.50)

## 2017-06-17 LAB — HEMOGLOBIN A1C: HEMOGLOBIN A1C: 6.8 % — AB (ref 4.6–6.5)

## 2017-06-17 LAB — ALT: ALT: 13 U/L (ref 0–53)

## 2017-06-17 LAB — PSA: PSA: 1.03 ng/mL (ref 0.10–4.00)

## 2017-06-17 LAB — AST: AST: 14 U/L (ref 0–37)

## 2017-06-17 NOTE — Progress Notes (Signed)
Pre visit review using our clinic review tool, if applicable. No additional management support is needed unless otherwise documented below in the visit note. 

## 2017-06-17 NOTE — Progress Notes (Signed)
Subjective:    Patient ID: Jimmy [name], male    DOB: 1951-02-18, 66 y.o.   MRN: [id number]  DOS:  06/17/2017 Type of visit - description : cpx Interval history: In addition to a CPX we discussed other issues. See below   Review of Systems For the last month had noted more DOE when he walks from his car to his desk @ work. Symptoms are not consistent, DOE present only 20% of the time, + associated with sweats, mild dizziness and he feels weak. Denies chest pain, palpitation, no cough, no syncope. He has mild lower extremity edema, at baseline. On further questioning, he has been less active for the last 3 months due to his work schedule.  Also, developed a cold a couple of days ago, + sinuses/chest congestion. No fever chills, mild cough  Other than above, a 14 point review of systems is negative     Past Medical History:  Diagnosis Date  . BCC (basal cell carcinoma of skin) 2013   dr Allyson Sabal   . CKD (chronic kidney disease), stage III ([place])   . Diabetes mellitus   . Hypertension   . Obesity   . Renal insufficiency    see nephrology yearly     Past Surgical History:  Procedure Laterality Date  . pilonidal cyst surgery    . POLYPECTOMY      Social History   Social History  . Marital status: Married    Spouse name: N/A  . Number of children: 4  . Years of education: N/A   Occupational History  . 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 Smoking status: Never Smoker  . Smokeless tobacco: Never Used  . Alcohol use 0.0 oz/week     Comment: 6 beers a year -rare   . Drug use: No  . Sexual activity: Not on file   Other Topics Concern  . Not on file   Social History Narrative   Household- pt, wife   Grown children , 6 g-children              Family History  Problem Relation Age of Onset  . Kidney failure Father   . Heart attack Unknown        GF 66s y/o  . Cancer Mother        type?  . Colon cancer Neg Hx   . Prostate  cancer Neg Hx   . Diabetes Neg Hx   . Colon polyps Neg Hx   . Rectal cancer Neg Hx   . Stomach cancer Neg Hx   . Thyroid cancer Neg Hx      Allergies as of 06/17/2017      Reactions   Benazepril Hcl    REACTION: cough   Valsartan Other (See Comments)   Unknown reaction      Medication List       Accurate as of 06/17/17  5:55 PM. Always use your most recent med list.          amLODipine 10 MG tablet Commonly known as:  NORVASC Take 5 mg by mouth daily. Reported on 01/13/2016   aspirin 81 MG tablet Take 81 mg by mouth daily. Reported on 01/13/2016   atorvastatin 10 MG tablet Commonly known as:  LIPITOR Take 0.5 tablets (5 mg total) by mouth daily.   CIALIS 20 MG tablet Generic drug:  tadalafil TAKE 1 TABLET (20 MG TOTAL) BY MOUTH EVERY OTHER DAY  AS NEEDED FOR ERECTILE DYSFUNCTION.   HM SUPER VITAMIN B12 2500 MCG Chew Generic drug:  Cyanocobalamin Chew 2,500 mcg by mouth daily.   liraglutide 18 MG/3ML Sopn Commonly known as:  VICTOZA Inject 0.2 mLs (1.2 mg total) into the skin daily.   losartan-hydrochlorothiazide 100-25 MG tablet Commonly known as:  HYZAAR Take 1 tablet by mouth daily.   metoprolol tartrate 50 MG tablet Commonly known as:  LOPRESSOR Take 1 tablet (50 mg total) by mouth 2 (two) times daily.   NOVOFINE 32G X 6 MM Misc Generic drug:  Insulin Pen Needle USE TO INJECT VICTOZA   pioglitazone 30 MG tablet Commonly known as:  ACTOS Take 0.5 tablets (15 mg total) by mouth daily.   Potassium 99 MG Tabs Take 99 mg by mouth daily.   PREBIOTIC PRODUCT PO Take 1 scoop by mouth daily.   Vitamin D3 1000 units Caps Take 1,000 Units by mouth daily.          Objective:   Physical Exam BP 118/68 (BP Location: Left Arm, Patient Position: Sitting, Cuff Size: Normal)   Pulse 68   Temp 98.1 F (36.7 C) (Oral)   Resp 14   Ht 5\' 10"  (1.778 m)   Wt (!) 309 lb 4 oz (140.3 kg)   SpO2 98%   BMI 44.37 kg/m   General:   Well developed, morbidly  obese, NAD.  Neck: No  thyromegaly  HEENT:  Normocephalic . Face symmetric, atraumatic. Nose congested, sinuses no TTP. TM:  Left obscured by wax, right normal. Lungs:  CTA B Normal respiratory effort, no intercostal retractions, no accessory muscle use. Heart: RRR,  no murmur.  Trace pretibial edema bilaterally  Abdomen:  Not distended, soft, non-tender. No rebound or rigidity.   Skin: Exposed areas without rash. Not pale. Not jaundice Neurologic:  alert & oriented X3.  Speech normal, gait limited by BMI Strength symmetric and appropriate for age.  Psych: Cognition and judgment appear intact.  Cooperative with normal attention span and concentration.  Behavior appropriate. No anxious or depressed appearing.    Assessment & Plan:   Assessment  DM -- used to be on glimepiride, dc after he lost weight Neuropathy: DX 05-2016, feet care discussed HTN Hyperlipidemia  Morbid Obesity Renal insufficiency, sees nephrology, Creat ~1.9 BCC 2013 Dr. Allyson Sabal DOE 2013, saw cards echo diastolic dysfx,Rx---weight loss and exercise LE pain, 2013: CKs normal, vascular exam was normal  PLAN: DM: On Victoza and Actos, check A1c. HTN: on Hyzaar, amlodipine, metoprolol, last BMP at nephrology satisfactory. No change Hyperlipidemia: On Lipitor, well-controlled per last FLP. DOE: More noticeable over the last month, symptoms are not consistent every time he walks. No chest pain. Saw cards 2013 with DOE, echo showed grade 2 diastolic dysfunction. EKG today : No acute changes, NSR, unchanged from previous He is diabetic, DDX includes CAD, HFpEF, CAD and also deconditioning. Plan: Echo, stress test, chest x-ray RTC 3 months.

## 2017-06-17 NOTE — Assessment & Plan Note (Addendum)
-  Td 2018 ;  pneumonia shot 09/26/09;  prevnar 2015;  zostavax 2014; shingrix; Flu shot today -CCS: Colonoscopy 01/2010, tubular adenoma; cscope again 02-2015, next per GI. -Prostate cancer screening:  DRE- PSA wnl 2016, no DRE done today but will chec a PSA  -Diet and exercise: he is  Vegetarian, counseled about exercise -Labs: LFTs, CBC, A1c, TSH, PSA

## 2017-06-17 NOTE — Patient Instructions (Signed)
GO TO THE LAB : Get the blood work     GO TO THE FRONT DESK Schedule your next appointment for a  follow-up in 3 months  STOP BY THE FIRST FLOOR:  get the XR    Start a gradual exercise program  If he has severe difficulty breathing, chest pain, increased swelling:  go to the ER

## 2017-06-17 NOTE — Assessment & Plan Note (Signed)
DM: On Victoza and Actos, check A1c. HTN: on Hyzaar, amlodipine, metoprolol, last BMP at nephrology satisfactory. No change Hyperlipidemia: On Lipitor, well-controlled per last FLP. DOE: More noticeable over the last month, symptoms are not consistent every time he walks. No chest pain. Saw cards 2013 with DOE, echo showed grade 2 diastolic dysfunction. EKG today : No acute changes, NSR, unchanged from previous He is diabetic, DDX includes CAD, HFpEF, CAD and also deconditioning. Plan: Echo, stress test, chest x-ray RTC 3 months.

## 2017-06-20 ENCOUNTER — Telehealth (HOSPITAL_COMMUNITY): Payer: Self-pay | Admitting: Internal Medicine

## 2017-06-22 NOTE — Telephone Encounter (Signed)
User: Cherie Dark A Date/time: 06/21/17 11:46 AM  Comment: Called pt and lmsg for him to CB to get sch for echo.   Context:  Outcome: Left Message  Phone number: [phone] Phone Type: Home Phone  Comm. type: Telephone Call type: Outgoing  Contact: Anselmo Rod Relation to patient: Self    User: Cherie Dark A Date/time: 06/20/17 2:11 PM  Comment: Called pt and lmsg for him to CB to get sch for echo and myoview.   Context:  Outcome: Left Message  Phone number: [phone] Phone Type: Mobile  Comm. type: Telephone Call type: Outgoing  Contact: Anselmo Rod Relation to patient: Self

## 2017-06-23 ENCOUNTER — Other Ambulatory Visit: Payer: Self-pay | Admitting: Internal Medicine

## 2017-06-23 MED FILL — METOPROLOL TARTRATE 50 MG T: 50 | 30 days supply | Qty: 60 | Fill #0

## 2017-06-23 MED FILL — LOSARTAN-HCTZ 100-25 MG TAB: 100-25 | 30 days supply | Qty: 30 | Fill #0

## 2017-06-23 MED FILL — ATORVASTATIN 10 MG TABLET: 10 | 30 days supply | Qty: 15 | Fill #5

## 2017-06-23 MED FILL — PIOGLITAZONE HCL 30 MG TAB: 30 | 30 days supply | Qty: 15 | Fill #4

## 2017-06-24 ENCOUNTER — Encounter: Payer: Self-pay | Admitting: Internal Medicine

## 2017-07-01 ENCOUNTER — Ambulatory Visit (HOSPITAL_BASED_OUTPATIENT_CLINIC_OR_DEPARTMENT_OTHER)
Admission: RE | Admit: 2017-07-01 | Discharge: 2017-07-01 | Disposition: A | Discharge status: Home / Self Care | Payer: BLUE CROSS/BLUE SHIELD | Source: Ambulatory Visit | Attending: Internal Medicine | Admitting: Internal Medicine

## 2017-07-01 DIAGNOSIS — E785 Hyperlipidemia, unspecified: Secondary | ICD-10-CM | POA: Insufficient documentation

## 2017-07-01 DIAGNOSIS — R0609 Other forms of dyspnea: Secondary | ICD-10-CM | POA: Insufficient documentation

## 2017-07-01 DIAGNOSIS — I10 Essential (primary) hypertension: Secondary | ICD-10-CM | POA: Insufficient documentation

## 2017-07-01 DIAGNOSIS — E119 Type 2 diabetes mellitus without complications: Secondary | ICD-10-CM | POA: Diagnosis not present

## 2017-07-01 NOTE — Progress Notes (Signed)
  Echocardiogram 2D Echocardiogram has been performed.  Jimmy [name] [name] 07/01/2017, 4:04 PM

## 2017-07-03 ENCOUNTER — Encounter: Payer: Self-pay | Admitting: Internal Medicine

## 2017-07-06 MED FILL — VICTOZA 2-PAK 18 MG/3 ML PE: 18 | 30 days supply | Qty: 6 | Fill #2

## 2017-07-25 ENCOUNTER — Other Ambulatory Visit: Payer: Self-pay | Admitting: Internal Medicine

## 2017-07-25 MED FILL — ATORVASTATIN 10 MG TABLET: 10 | 30 days supply | Qty: 15 | Fill #0

## 2017-07-25 MED FILL — METOPROLOL TARTRATE 50 MG T: 50 | 30 days supply | Qty: 60 | Fill #1

## 2017-07-25 MED FILL — LOSARTAN-HCTZ 100-25 MG TAB: 100-25 | 30 days supply | Qty: 30 | Fill #1

## 2017-07-28 ENCOUNTER — Encounter: Payer: Self-pay | Admitting: Internal Medicine

## 2017-07-28 MED FILL — PIOGLITAZONE HCL 30 MG TAB: 30 | 30 days supply | Qty: 15 | Fill #5

## 2017-08-08 MED FILL — VICTOZA 2-PAK 18 MG/3 ML PE: 18 | 30 days supply | Qty: 6 | Fill #3

## 2017-08-19 MED FILL — NOVOFINE 32G NEEDLES: 32G X 6 MM | 30 days supply | Qty: 100 | Fill #3

## 2017-08-22 ENCOUNTER — Other Ambulatory Visit: Payer: Self-pay | Admitting: Internal Medicine

## 2017-08-22 MED FILL — PIOGLITAZONE HCL 30 MG TAB: 30 | 30 days supply | Qty: 15 | Fill #0

## 2017-08-24 MED FILL — ATORVASTATIN 10 MG TABLET: 10 | 30 days supply | Qty: 15 | Fill #1

## 2017-08-24 MED FILL — METOPROLOL TARTRATE 50 MG T: 50 | 30 days supply | Qty: 60 | Fill #2

## 2017-08-24 MED FILL — LOSARTAN-HCTZ 100-25 MG TAB: 100-25 | 30 days supply | Qty: 30 | Fill #2

## 2017-09-06 MED FILL — VICTOZA 2-PAK 18 MG/3 ML PE: 18 | 30 days supply | Qty: 6 | Fill #4

## 2017-09-20 MED FILL — PIOGLITAZONE HCL 30 MG TAB: 30 | 30 days supply | Qty: 15 | Fill #1

## 2017-09-22 ENCOUNTER — Ambulatory Visit: Payer: BLUE CROSS/BLUE SHIELD | Admitting: Internal Medicine

## 2017-09-22 ENCOUNTER — Encounter: Payer: Self-pay | Admitting: Internal Medicine

## 2017-09-22 VITALS — BP 132/84 | HR 79 | Temp 97.9°F | Resp 14 | Ht 70.0 in | Wt 315.4 lb

## 2017-09-22 DIAGNOSIS — E118 Type 2 diabetes mellitus with unspecified complications: Secondary | ICD-10-CM | POA: Diagnosis not present

## 2017-09-22 DIAGNOSIS — R0609 Other forms of dyspnea: Secondary | ICD-10-CM

## 2017-09-22 DIAGNOSIS — I10 Essential (primary) hypertension: Secondary | ICD-10-CM | POA: Diagnosis not present

## 2017-09-22 LAB — BASIC METABOLIC PANEL
BUN: 23 mg/dL (ref 6–23)
CALCIUM: 9.4 mg/dL (ref 8.4–10.5)
CO2: 28 meq/L (ref 19–32)
Chloride: 99 mEq/L (ref 96–112)
Creatinine, Ser: 1.66 mg/dL — ABNORMAL HIGH (ref 0.40–1.50)
GFR: 44.27 mL/min — AB (ref >60.00)
Glucose, Bld: 142 mg/dL — ABNORMAL HIGH (ref 70–99)
POTASSIUM: 5.1 meq/L (ref 3.5–5.1)
Sodium: 134 mEq/L — ABNORMAL LOW (ref 135–145)

## 2017-09-22 LAB — HEMOGLOBIN A1C: Hgb A1c MFr Bld: 6.8 % — ABNORMAL HIGH (ref 4.6–6.5)

## 2017-09-22 NOTE — Patient Instructions (Signed)
GO TO THE LAB : Get the blood work     GO TO THE FRONT DESK Schedule your next appointment for a   checkup in 6 months 

## 2017-09-22 NOTE — Assessment & Plan Note (Signed)
DM: Good compliance with Victoza and Actos, check a A1c.  Reports he is doing okay with his diet and is trying to ride his bike sometimes. HTN: on Hyzaar, amlodipine, metoprolol, check a BMP. Skin lesion: I inspected the area of the patient's concern and it looks okay, nevertheless plans to  see his dermatologist next month. DOE: Since the last visit echo and chest x-ray were okay.  Could not afford a stress test.  Sx are not worse, they are inconsistent.  We agree on observation for now.  Call if sxs increase. RTC 6 months

## 2017-09-22 NOTE — Progress Notes (Signed)
Pre visit review using our clinic review tool, if applicable. No additional management support is needed unless otherwise documented below in the visit note. 

## 2017-09-22 NOTE — Progress Notes (Signed)
Subjective:    Patient ID: Jimmy [name], male    DOB: Jan 23, 1951, 67 y.o.   MRN: [id number]  DOS:  09/22/2017 Type of visit - description : rov  Interval history: Doing well in general.  Good compliance of medication.  Ambulatory BPs in the 120s/70.  CBGs in the 120, 130 range. See last visit, c/o DOE, chest x-ray and echo negative.  Could not do stress test d/t cost. Continue with DOE, but reports sx  are inconsistent, typically has problem when he walks or go up a hill however he is able to ride a bike without problems.   Review of Systems Denies chest pain. Edema minimal Did have a skin lesion on his back, but he could not see it but it  felt rough.  Has an appointment with dermatology.  Past Medical History:  Diagnosis Date  . BCC (basal cell carcinoma of skin) 2013   dr Allyson Sabal   . CKD (chronic kidney disease), stage III ([place])   . Diabetes mellitus   . Hypertension   . Obesity   . Renal insufficiency    see nephrology yearly     Past Surgical History:  Procedure Laterality Date  . pilonidal cyst surgery    . POLYPECTOMY      Social History   Socioeconomic History  . Marital status: Married    Spouse name: Not on file  . Number of children: 4  . Years of education: Not on file  . Highest education level: Not on file  Social Needs  . Financial resource strain: Not on file  . Food insecurity - worry: Not on file  . Food insecurity - inability: Not on file  . Transportation needs - medical: Not on file  . Transportation needs - non-medical: Not on file  Occupational History  . Occupation: Building control surveyor, Pharmacist, hospital @ [employer]   Tobacco Use  . Smoking status: Never Smoker  . Smokeless tobacco: Never Used  Substance and Sexual Activity  . Alcohol use: Yes    Alcohol/week: 0.0 oz    Comment: 6 beers a year -rare   . Drug use: No  . Sexual activity: Not on file  Other Topics Concern  . Not on file  Social History Narrative   Household- pt, wife   Grown  children , 6 g-children            Allergies as of 09/22/2017      Reactions   Benazepril Hcl    REACTION: cough   Valsartan Other (See Comments)   Unknown reaction      Medication List        Accurate as of 09/22/17  3:33 PM. Always use your most recent med list.          amLODipine 10 MG tablet Commonly known as:  NORVASC Take 5 mg by mouth daily. Reported on 01/13/2016   aspirin 81 MG tablet Take 81 mg by mouth daily. Reported on 01/13/2016   atorvastatin 10 MG tablet Commonly known as:  LIPITOR Take 0.5 tablets (5 mg total) by mouth daily.   CIALIS 20 MG tablet Generic drug:  tadalafil TAKE 1 TABLET (20 MG TOTAL) BY MOUTH EVERY OTHER DAY AS NEEDED FOR ERECTILE DYSFUNCTION.   Garlic 0539 MG Caps Take 2,000 mg by mouth daily.   HM SUPER VITAMIN B12 2500 MCG Chew Generic drug:  Cyanocobalamin Chew 2,500 mcg by mouth daily.   liraglutide 18 MG/3ML Sopn Commonly known as:  VICTOZA Inject 0.2  mLs (1.2 mg total) into the skin daily.   losartan-hydrochlorothiazide 100-25 MG tablet Commonly known as:  HYZAAR Take 1 tablet by mouth daily.   metoprolol tartrate 50 MG tablet Commonly known as:  LOPRESSOR Take 1 tablet (50 mg total) by mouth 2 (two) times daily.   NOVOFINE 32G X 6 MM Misc Generic drug:  Insulin Pen Needle USE TO INJECT VICTOZA   pioglitazone 30 MG tablet Commonly known as:  ACTOS Take 0.5 tablets (15 mg total) by mouth daily.   Potassium 99 MG Tabs Take 99 mg by mouth daily.   PREBIOTIC PRODUCT PO Take 1 scoop by mouth daily.   Vitamin D3 1000 units Caps Take 1,000 Units by mouth daily.          Objective:   Physical Exam BP 132/84 (BP Location: Left Arm, Patient Position: Sitting, Cuff Size: Normal)   Pulse 79   Temp 97.9 F (36.6 C) (Oral)   Resp 14   Ht 5\' 10"  (1.778 m)   Wt (!) 315 lb 6 oz (143.1 kg)   SpO2 98%   BMI 45.25 kg/m  General:   Well developed, NAD.  HEENT:  Normocephalic . Face symmetric,  atraumatic Lungs:  CTA B Normal respiratory effort, no intercostal retractions, no accessory muscle use. Heart: RRR,  no murmur.  Trace pretibial edema bilaterally  Skin: He points to the middle of the back   where he had a rough spot, on  inspection I do not see any hyperpigmented or other lesions. Neurologic:  alert & oriented X3.  Speech normal, gait appropriate for age and unassisted Psych--  Cognition and judgment appear intact.  Cooperative with normal attention span and concentration.  Behavior appropriate. No anxious or depressed appearing.      Assessment & Plan:    Assessment  DM -- used to be on glimepiride, dc after he lost weight Neuropathy: DX 05-2016, feet care discussed HTN Hyperlipidemia  Morbid Obesity Renal insufficiency, sees nephrology, Creat ~1.9 BCC 2013 Dr. Allyson Sabal, sees derm q year DOE 2013, saw cards echo diastolic dysfx,Rx---weight loss and exercise DOE 2018: Chest x-ray and echo negative, could not afford stress test. rx observation  LE pain, 2013: CKs normal, vascular exam was normal  PLAN: DM: Good compliance with Victoza and Actos, check a A1c.  Reports he is doing okay with his diet and is trying to ride his bike sometimes. HTN: on Hyzaar, amlodipine, metoprolol, check a BMP. Skin lesion: I inspected the area of the patient's concern and it looks okay, nevertheless plans to  see his dermatologist next month. DOE: Since the last visit echo and chest x-ray were okay.  Could not afford a stress test.  Sx are not worse, they are inconsistent.  We agree on observation for now.  Call if sxs increase. RTC 6 months

## 2017-09-23 ENCOUNTER — Ambulatory Visit: Payer: BLUE CROSS/BLUE SHIELD | Admitting: Internal Medicine

## 2017-09-26 MED FILL — ATORVASTATIN 10 MG TABLET: 10 | 30 days supply | Qty: 15 | Fill #2

## 2017-09-26 MED FILL — LOSARTAN-HCTZ 100-25 MG TAB: 100-25 | 30 days supply | Qty: 30 | Fill #3

## 2017-09-26 MED FILL — METOPROLOL TARTRATE 50 MG T: 50 | 30 days supply | Qty: 60 | Fill #3

## 2017-10-04 ENCOUNTER — Other Ambulatory Visit: Payer: Self-pay | Admitting: Internal Medicine

## 2017-10-04 MED FILL — VICTOZA 2-PAK 18 MG/3 ML PE: 18 | 30 days supply | Qty: 6 | Fill #0

## 2017-10-18 MED FILL — PIOGLITAZONE HCL 30 MG TAB: 30 | 30 days supply | Qty: 15 | Fill #2

## 2017-10-27 MED FILL — METOPROLOL TARTRATE 50 MG T: 50 | 30 days supply | Qty: 60 | Fill #4

## 2017-10-27 MED FILL — LOSARTAN-HCTZ 100-25 MG TAB: 100-25 | 30 days supply | Qty: 30 | Fill #4

## 2017-10-27 MED FILL — ATORVASTATIN 10 MG TABLET: 10 | 30 days supply | Qty: 15 | Fill #3

## 2017-11-01 MED FILL — VICTOZA 2-PAK 18 MG/3 ML PE: 18 | 30 days supply | Qty: 6 | Fill #1

## 2017-11-15 MED FILL — PIOGLITAZONE HCL 30 MG TAB: 30 | 30 days supply | Qty: 15 | Fill #3

## 2017-11-24 MED FILL — LOSARTAN-HCTZ 100-25 MG TAB: 100-25 | 30 days supply | Qty: 30 | Fill #5

## 2017-11-24 MED FILL — METOPROLOL TARTRATE 50 MG T: 50 | 30 days supply | Qty: 60 | Fill #5

## 2017-11-24 MED FILL — ATORVASTATIN 10 MG TABLET: 10 | 30 days supply | Qty: 15 | Fill #4

## 2017-12-02 ENCOUNTER — Other Ambulatory Visit: Payer: Self-pay | Admitting: Internal Medicine

## 2017-12-02 MED FILL — VICTOZA 2-PAK 18 MG/3 ML PE: 18 | 30 days supply | Qty: 6 | Fill #2

## 2017-12-02 MED FILL — NOVOFINE 32G NEEDLES: 32G X 6 MM | 30 days supply | Qty: 100 | Fill #0

## 2017-12-19 MED FILL — PIOGLITAZONE HCL 30 MG TAB: 30 | 30 days supply | Qty: 15 | Fill #4

## 2017-12-26 ENCOUNTER — Other Ambulatory Visit: Payer: Self-pay | Admitting: Internal Medicine

## 2017-12-26 MED FILL — LOSARTAN-HCTZ 100-25 MG TAB: 100-25 | 30 days supply | Qty: 30 | Fill #0

## 2017-12-26 MED FILL — METOPROLOL TARTRATE 50 MG T: 50 | 30 days supply | Qty: 60 | Fill #0

## 2017-12-26 MED FILL — ATORVASTATIN 10 MG TABLET: 10 | 30 days supply | Qty: 15 | Fill #5

## 2018-01-02 MED FILL — VICTOZA 2-PAK 18 MG/3 ML PE: 18 | 30 days supply | Qty: 6 | Fill #3

## 2018-01-19 MED FILL — PIOGLITAZONE HCL 30 MG TAB: 30 | 30 days supply | Qty: 15 | Fill #5

## 2018-01-24 ENCOUNTER — Other Ambulatory Visit: Payer: Self-pay | Admitting: Internal Medicine

## 2018-01-24 MED FILL — ATORVASTATIN 10 MG TABLET: 10 | 15 days supply | Qty: 15 | Fill #0

## 2018-01-24 MED FILL — METOPROLOL TARTRATE 50 MG T: 50 | 30 days supply | Qty: 60 | Fill #1

## 2018-01-24 MED FILL — LOSARTAN-HCTZ 100-25 MG TAB: 100-25 | 30 days supply | Qty: 30 | Fill #1

## 2018-02-03 ENCOUNTER — Encounter: Payer: Self-pay | Admitting: Internal Medicine

## 2018-02-03 NOTE — Telephone Encounter (Signed)
Prior auth started on medication.  Patient notified.  Awaiting answer from ins.  Key: [id number]

## 2018-02-07 ENCOUNTER — Telehealth: Payer: Self-pay

## 2018-02-07 MED FILL — VICTOZA 2-PAK 18 MG/3 ML PE: 18 | 30 days supply | Qty: 6 | Fill #4

## 2018-02-07 NOTE — Telephone Encounter (Signed)
Left message on machine of approval.

## 2018-02-07 NOTE — Telephone Encounter (Signed)
PA initiated via Covermymeds; KEY: [id number]. Awaiting determination.

## 2018-02-07 NOTE — Telephone Encounter (Signed)
PA approved.   Request Reference Number: [id number]. VICTOZA INJ 18MG /3ML is approved through 02/08/2019. For further questions, call [phone].

## 2018-02-07 NOTE — Telephone Encounter (Signed)
Called [phone] to check status.  It is still in review.  Will call tomorrow to check status.   Patient notified.

## 2018-02-17 ENCOUNTER — Other Ambulatory Visit: Payer: Self-pay | Admitting: Internal Medicine

## 2018-02-17 MED FILL — PIOGLITAZONE HCL 30 MG TAB: 30 | 30 days supply | Qty: 15 | Fill #0

## 2018-02-20 MED FILL — ATORVASTATIN 10 MG TABLET: 10 | 30 days supply | Qty: 15 | Fill #1

## 2018-02-20 MED FILL — LOSARTAN-HCTZ 100-25 MG TAB: 100-25 | 30 days supply | Qty: 30 | Fill #2

## 2018-02-20 MED FILL — METOPROLOL TARTRATE 50 MG T: 50 | 30 days supply | Qty: 60 | Fill #2

## 2018-03-06 MED FILL — VICTOZA 2-PAK 18 MG/3 ML PE: 18 | 30 days supply | Qty: 6 | Fill #5

## 2018-03-06 MED FILL — TECHLITE PEN NDL 32GX1/4: 32G X 6 MM | 30 days supply | Qty: 100 | Fill #1

## 2018-03-06 MED FILL — TECHLITE PEN NDL 32GX1/4": 32G X 6 MM | 30 days supply | Qty: 100 | Fill #1

## 2018-03-20 MED FILL — PIOGLITAZONE HCL 30 MG TAB: 30 | 30 days supply | Qty: 15 | Fill #1

## 2018-03-21 ENCOUNTER — Ambulatory Visit: Payer: BLUE CROSS/BLUE SHIELD | Admitting: Internal Medicine

## 2018-03-23 ENCOUNTER — Encounter: Payer: Self-pay | Admitting: Internal Medicine

## 2018-03-23 ENCOUNTER — Other Ambulatory Visit: Payer: Self-pay | Admitting: Internal Medicine

## 2018-03-23 MED FILL — LOSARTAN-HCTZ 100-25 MG TAB: 100-25 | 30 days supply | Qty: 30 | Fill #3

## 2018-03-23 MED FILL — METOPROLOL TARTRATE 50 MG T: 50 | 30 days supply | Qty: 60 | Fill #3

## 2018-03-23 MED FILL — ATORVASTATIN 10 MG TABLET: 10 | 30 days supply | Qty: 15 | Fill #0

## 2018-03-27 ENCOUNTER — Other Ambulatory Visit: Payer: Self-pay | Admitting: Internal Medicine

## 2018-03-29 ENCOUNTER — Ambulatory Visit: Payer: No Typology Code available for payment source | Admitting: Internal Medicine

## 2018-03-29 ENCOUNTER — Encounter: Payer: Self-pay | Admitting: Internal Medicine

## 2018-03-29 VITALS — BP 136/84 | HR 65 | Temp 98.4°F | Resp 14 | Ht 70.0 in | Wt 305.5 lb

## 2018-03-29 DIAGNOSIS — N183 Chronic kidney disease, stage 3 unspecified: Secondary | ICD-10-CM

## 2018-03-29 DIAGNOSIS — E785 Hyperlipidemia, unspecified: Secondary | ICD-10-CM

## 2018-03-29 DIAGNOSIS — E118 Type 2 diabetes mellitus with unspecified complications: Secondary | ICD-10-CM | POA: Diagnosis not present

## 2018-03-29 LAB — COMPREHENSIVE METABOLIC PANEL
ALBUMIN: 4.3 g/dL (ref 3.5–5.2)
ALK PHOS: 65 U/L (ref 39–117)
ALT: 16 U/L (ref 0–53)
AST: 14 U/L (ref 0–37)
BILIRUBIN TOTAL: 0.6 mg/dL (ref 0.2–1.2)
BUN: 30 mg/dL — AB (ref 6–23)
CALCIUM: 9.7 mg/dL (ref 8.4–10.5)
CHLORIDE: 103 meq/L (ref 96–112)
CO2: 27 mEq/L (ref 19–32)
CREATININE: 1.93 mg/dL — AB (ref 0.40–1.50)
GFR: 37.15 mL/min — ABNORMAL LOW (ref >60.00)
Glucose, Bld: 119 mg/dL — ABNORMAL HIGH (ref 70–99)
Potassium: 4.9 mEq/L (ref 3.5–5.1)
SODIUM: 138 meq/L (ref 135–145)
TOTAL PROTEIN: 7.1 g/dL (ref 6.0–8.3)

## 2018-03-29 LAB — HEMOGLOBIN A1C: HEMOGLOBIN A1C: 6.6 % — AB (ref 4.6–6.5)

## 2018-03-29 LAB — LIPID PANEL
CHOLESTEROL: 108 mg/dL (ref 0–200)
HDL: 38.6 mg/dL — ABNORMAL LOW (ref >39.00)
LDL Cholesterol: 40 mg/dL (ref 0–99)
NonHDL: 69.72
Total CHOL/HDL Ratio: 3
Triglycerides: 149 mg/dL (ref 0.0–149.0)
VLDL: 29.8 mg/dL (ref 0.0–40.0)

## 2018-03-29 NOTE — Assessment & Plan Note (Signed)
DM: Continue Victoza, Actos, check a A1c.  + Weight loss noted.  Praised. Neuropathy, history of: Denies any feet burning at night High cholesterol: On Lipitor, check a FLP Chronic renal insufficiency: We will see nephrology in few months, check a CMP HTN: Continue amlodipine, Hyzaar, metoprolol.  BP today is very good RTC 3 to 4 months, physical exam.

## 2018-03-29 NOTE — Progress Notes (Signed)
Pre visit review using our clinic review tool, if applicable. No additional management support is needed unless otherwise documented below in the visit note. 

## 2018-03-29 NOTE — Progress Notes (Signed)
Subjective:    Patient ID: Jimmy [name], male    DOB: 1950/12/20, 67 y.o.   MRN: [id number]  DOS:  03/29/2018 Type of visit - description : Routine visit Interval history: No concerns, good compliance with medication.  Wt Readings from Last 3 Encounters:  03/29/18 (!) 305 lb 8 oz (138.6 kg)  09/22/17 (!) 315 lb 6 oz (143.1 kg)  06/17/17 (!) 309 lb 4 oz (140.3 kg)    Review of Systems Has lost some weight, he is trying to eat healthy.  Not able to exercise much due to mostly LE stiffness, no really pain. No neuropathy symptoms at this point.  Past Medical History:  Diagnosis Date  . BCC (basal cell carcinoma of skin) 2013   dr Allyson Sabal   . CKD (chronic kidney disease), stage III ([place])   . Diabetes mellitus   . Hypertension   . Obesity   . Renal insufficiency    see nephrology yearly     Past Surgical History:  Procedure Laterality Date  . pilonidal cyst surgery    . POLYPECTOMY      Social History   Socioeconomic History  . Marital status: Married    Spouse name: Not on file  . Number of children: 4  . Years of education: Not on file  . Highest education level: Not on file  Occupational History  . Occupation: Building control surveyor, Pharmacist, hospital @ [employer]   Social Needs  . Financial resource strain: Not on file  . Food insecurity:    Worry: Not on file    Inability: Not on file  . Transportation needs:    Medical: Not on file    Non-medical: Not on file  Tobacco Use  . Smoking status: Never Smoker  . Smokeless tobacco: Never Used  Substance and Sexual Activity  . Alcohol use: Yes    Alcohol/week: 0.0 oz    Comment: 6 beers a year -rare   . Drug use: No  . Sexual activity: Not on file  Lifestyle  . Physical activity:    Days per week: Not on file    Minutes per session: Not on file  . Stress: Not on file  Relationships  . Social connections:    Talks on phone: Not on file    Gets together: Not on file    Attends religious service: Not on file    Active member  of club or organization: Not on file    Attends meetings of clubs or organizations: Not on file    Relationship status: Not on file  . Intimate partner violence:    Fear of current or ex partner: Not on file    Emotionally abused: Not on file    Physically abused: Not on file    Forced sexual activity: Not on file  Other Topics Concern  . Not on file  Social History Narrative   Household- pt, wife   Grown children , 6 g-children            Allergies as of 03/29/2018      Reactions   Benazepril Hcl    REACTION: cough   Valsartan Other (See Comments)   Unknown reaction      Medication List        Accurate as of 03/29/18  9:38 PM. Always use your most recent med list.          amLODipine 10 MG tablet Commonly known as:  NORVASC Take 5 mg by mouth daily. Reported  on 01/13/2016   aspirin 81 MG tablet Take 81 mg by mouth daily. Reported on 01/13/2016   atorvastatin 10 MG tablet Commonly known as:  LIPITOR Take 0.5 tablets (5 mg total) by mouth daily.   Garlic 3235 MG Caps Take 2,000 mg by mouth daily.   HM SUPER VITAMIN B12 2500 MCG Chew Generic drug:  Cyanocobalamin Chew 2,500 mcg by mouth daily.   liraglutide 18 MG/3ML Sopn Commonly known as:  VICTOZA Inject 0.2 mLs (1.2 mg total) into the skin daily.   losartan-hydrochlorothiazide 100-25 MG tablet Commonly known as:  HYZAAR Take 1 tablet by mouth daily.   metoprolol tartrate 50 MG tablet Commonly known as:  LOPRESSOR Take 1 tablet (50 mg total) by mouth 2 (two) times daily.   pioglitazone 30 MG tablet Commonly known as:  ACTOS Take 0.5 tablets (15 mg total) by mouth daily.   Potassium 99 MG Tabs Take 99 mg by mouth daily.   PREBIOTIC PRODUCT PO Take 1 scoop by mouth daily.   TECHLITE PEN NEEDLES 32G X 6 MM Misc Generic drug:  Insulin Pen Needle USE TO INJECT VICTOZA   Vitamin D3 1000 units Caps Take 1,000 Units by mouth daily.          Objective:   Physical Exam BP 136/84 (BP Location:  Left Arm, Patient Position: Sitting, Cuff Size: Normal)   Pulse 65   Temp 98.4 F (36.9 C) (Oral)   Resp 14   Ht 5\' 10"  (1.778 m)   Wt (!) 305 lb 8 oz (138.6 kg)   SpO2 96%   BMI 43.83 kg/m  General:   Well developed, NAD, see BMI.  HEENT:  Normocephalic . Face symmetric, atraumatic Lungs:  CTA B Normal respiratory effort, no intercostal retractions, no accessory muscle use. Heart: RRR,  no murmur.  Trace pretibial edema bilaterally  Skin: Not pale. Not jaundice Neurologic:  alert & oriented X3.  Speech normal, gait appropriate for age and unassisted Psych--  Cognition and judgment appear intact.  Cooperative with normal attention span and concentration.  Behavior appropriate. No anxious or depressed appearing.      Assessment & Plan:   Assessment  DM -- used to be on glimepiride, dc after he lost weight Neuropathy: DX 05-2016, feet care discussed HTN Hyperlipidemia  Morbid Obesity Renal insufficiency, sees nephrology, Creat ~1.9 BCC 2013 Dr. Allyson Sabal, sees derm q year DOE 2013, saw cards echo diastolic dysfx,Rx---weight loss and exercise DOE 2018: Chest x-ray and echo negative, could not afford stress test. rx observation  LE pain, 2013: CKs normal, vascular exam was normal  PLAN: DM: Continue Victoza, Actos, check a A1c.  + Weight loss noted.  Praised. Neuropathy, history of: Denies any feet burning at night High cholesterol: On Lipitor, check a FLP Chronic renal insufficiency: We will see nephrology in few months, check a CMP HTN: Continue amlodipine, Hyzaar, metoprolol.  BP today is very good RTC 3 to 4 months, physical exam.

## 2018-03-29 NOTE — Patient Instructions (Signed)
GO TO THE LAB : Get the blood work     GO TO THE FRONT DESK Schedule your next appointment for a physical exam in 3 to 4 months, nonfasting

## 2018-03-30 MED FILL — VICTOZA 2-PAK 18 MG/3 ML PE: 18 | 30 days supply | Qty: 6 | Fill #6

## 2018-04-17 ENCOUNTER — Other Ambulatory Visit: Payer: Self-pay | Admitting: Internal Medicine

## 2018-04-17 MED FILL — PIOGLITAZONE HCL 30 MG TAB: 30 | 30 days supply | Qty: 15 | Fill #0

## 2018-04-24 MED FILL — LOSARTAN-HCTZ 100-25 MG TAB: 100-25 | 30 days supply | Qty: 30 | Fill #4

## 2018-04-24 MED FILL — METOPROLOL TARTRATE 50 MG T: 50 | 30 days supply | Qty: 60 | Fill #4

## 2018-04-24 MED FILL — VICTOZA 2-PAK 18 MG/3 ML PE: 18 | 30 days supply | Qty: 6 | Fill #7

## 2018-04-24 MED FILL — ATORVASTATIN CALCIUM 10 MG: 10 | 30 days supply | Qty: 15 | Fill #1

## 2018-05-22 MED FILL — ATORVASTATIN 10 MG TABLET: 10 | 30 days supply | Qty: 15 | Fill #2

## 2018-05-22 MED FILL — METOPROLOL TARTRATE 50 MG T: 50 | 30 days supply | Qty: 60 | Fill #5

## 2018-05-22 MED FILL — LOSARTAN-HCTZ 100-25 MG TAB: 100-25 | 30 days supply | Qty: 30 | Fill #5

## 2018-05-22 MED FILL — PIOGLITAZONE HCL 30 MG TAB: 30 | 30 days supply | Qty: 15 | Fill #1

## 2018-05-22 MED FILL — VICTOZA 2-PAK 18 MG/3 ML PE: 18 | 30 days supply | Qty: 6 | Fill #8

## 2018-05-22 MED FILL — NOVOFINE 32G NEEDLES: 32G X 6 MM | 90 days supply | Qty: 100 | Fill #0

## 2018-06-19 ENCOUNTER — Other Ambulatory Visit: Payer: Self-pay | Admitting: Internal Medicine

## 2018-06-19 MED FILL — VICTOZA 2-PAK 18 MG/3 ML PE: 18 | 30 days supply | Qty: 6 | Fill #9

## 2018-06-19 MED FILL — METOPROLOL TARTRATE 50 MG T: 50 | 30 days supply | Qty: 60 | Fill #0

## 2018-06-19 MED FILL — ATORVASTATIN 10 MG TABLET: 10 | 30 days supply | Qty: 15 | Fill #3

## 2018-06-19 MED FILL — LOSARTAN-HCTZ 100-25 MG TAB: 100-25 | 30 days supply | Qty: 30 | Fill #0

## 2018-06-19 MED FILL — PIOGLITAZONE HCL 30 MG TAB: 30 | 30 days supply | Qty: 15 | Fill #2

## 2018-07-07 ENCOUNTER — Encounter: Payer: Self-pay | Admitting: Internal Medicine

## 2018-07-07 ENCOUNTER — Ambulatory Visit: Payer: No Typology Code available for payment source | Admitting: Internal Medicine

## 2018-07-07 VITALS — BP 124/66 | HR 68 | Temp 98.1°F | Resp 16 | Ht 70.0 in | Wt 306.0 lb

## 2018-07-07 DIAGNOSIS — Z0000 Encounter for general adult medical examination without abnormal findings: Secondary | ICD-10-CM

## 2018-07-07 DIAGNOSIS — Z23 Encounter for immunization: Secondary | ICD-10-CM | POA: Diagnosis not present

## 2018-07-07 DIAGNOSIS — E118 Type 2 diabetes mellitus with unspecified complications: Secondary | ICD-10-CM | POA: Diagnosis not present

## 2018-07-07 NOTE — Progress Notes (Signed)
Subjective:    Patient ID: Jimmy [name], male    DOB: August 29, 1951, 67 y.o.   MRN: [id number]  DOS:  07/07/2018 Type of visit - description : cpx Interval history: No concerns, in general feeling well  Wt Readings from Last 3 Encounters:  07/07/18 (!) 306 lb (138.8 kg)  03/29/18 (!) 305 lb 8 oz (138.6 kg)  09/22/17 (!) 315 lb 6 oz (143.1 kg)    Review of Systems  A 14 point review of systems is negative    Past Medical History:  Diagnosis Date  . BCC (basal cell carcinoma of skin) 2013   dr Allyson Sabal   . CKD (chronic kidney disease), stage III ([place])   . Diabetes mellitus   . Hypertension   . Obesity   . Renal insufficiency    see nephrology yearly     Past Surgical History:  Procedure Laterality Date  . pilonidal cyst surgery    . POLYPECTOMY      Social History   Socioeconomic History  . Marital status: Married    Spouse name: Not on file  . Number of children: 4  . Years of education: Not on file  . Highest education level: Not on file  Occupational History  . Occupation: Building control surveyor, Pharmacist, hospital @ [employer]   Social Needs  . Financial resource strain: Not on file  . Food insecurity:    Worry: Not on file    Inability: Not on file  . Transportation needs:    Medical: Not on file    Non-medical: Not on file  Tobacco Use  . Smoking status: Never Smoker  . Smokeless tobacco: Never Used  Substance and Sexual Activity  . Alcohol use: Yes    Alcohol/week: 0.0 standard drinks    Comment: 6 beers a year -rare   . Drug use: No  . Sexual activity: Not on file  Lifestyle  . Physical activity:    Days per week: Not on file    Minutes per session: Not on file  . Stress: Not on file  Relationships  . Social connections:    Talks on phone: Not on file    Gets together: Not on file    Attends religious service: Not on file    Active member of club or organization: Not on file    Attends meetings of clubs or organizations: Not on file    Relationship status: Not  on file  . Intimate partner violence:    Fear of current or ex partner: Not on file    Emotionally abused: Not on file    Physically abused: Not on file    Forced sexual activity: Not on file  Other Topics Concern  . Not on file  Social History Narrative   Household- pt, wife   Grown children , 7 g-children           Family History  Problem Relation Age of Onset  . Kidney failure Father   . Heart attack Unknown        GF 33s y/o  . Cancer Mother        type?  . Colon cancer Neg Hx   . Prostate cancer Neg Hx   . Diabetes Neg Hx   . Colon polyps Neg Hx   . Rectal cancer Neg Hx   . Stomach cancer Neg Hx   . Thyroid cancer Neg Hx      Allergies as of 07/07/2018      Reactions  Benazepril Hcl    REACTION: cough   Valsartan Other (See Comments)   Unknown reaction      Medication List        Accurate as of 07/07/18 11:59 PM. Always use your most recent med list.          amLODipine 10 MG tablet Commonly known as:  NORVASC Take 5 mg by mouth daily. Reported on 01/13/2016   aspirin 81 MG tablet Take 81 mg by mouth daily. Reported on 01/13/2016   atorvastatin 10 MG tablet Commonly known as:  LIPITOR Take 0.5 tablets (5 mg total) by mouth daily.   Garlic 1884 MG Caps Take 2,000 mg by mouth daily.   HM SUPER VITAMIN B12 2500 MCG Chew Generic drug:  Cyanocobalamin Chew 2,500 mcg by mouth daily.   liraglutide 18 MG/3ML Sopn Commonly known as:  VICTOZA Inject 0.2 mLs (1.2 mg total) into the skin daily.   losartan-hydrochlorothiazide 100-25 MG tablet Commonly known as:  HYZAAR Take 1 tablet by mouth daily.   metoprolol tartrate 50 MG tablet Commonly known as:  LOPRESSOR Take 1 tablet (50 mg total) by mouth 2 (two) times daily.   pioglitazone 30 MG tablet Commonly known as:  ACTOS Take 0.5 tablets (15 mg total) by mouth daily.   Potassium 99 MG Tabs Take 99 mg by mouth daily.   PREBIOTIC PRODUCT PO Take 1 scoop by mouth daily.   TECHLITE PEN NEEDLES  32G X 6 MM Misc Generic drug:  Insulin Pen Needle USE TO INJECT VICTOZA   Vitamin D3 25 MCG (1000 UT) Caps Take 1,000 Units by mouth daily.          Objective:   Physical Exam BP 124/66 (BP Location: Left Arm, Patient Position: Sitting, Cuff Size: Normal)   Pulse 68   Temp 98.1 F (36.7 C) (Oral)   Resp 16   Ht 5\' 10"  (1.778 m)   Wt (!) 306 lb (138.8 kg)   SpO2 95%   BMI 43.91 kg/m  General: Well developed, NAD, BMI noted Neck: No  thyromegaly  HEENT:  Normocephalic . Face symmetric, atraumatic Lungs:  CTA B Normal respiratory effort, no intercostal retractions, no accessory muscle use. Heart: RRR,  no murmur.  No pretibial edema bilaterally  Abdomen:  Not distended, soft, non-tender. No rebound or rigidity.   Skin: Exposed areas without rash. Not pale. Not jaundice Neurologic:  alert & oriented X3.  Speech normal, gait limited by BMI Strength symmetric and appropriate for age.  Psych: Cognition and judgment appear intact.  Cooperative with normal attention span and concentration.  Behavior appropriate. No anxious or depressed appearing.     Assessment & Plan:   Assessment  DM -- used to be on glimepiride, dc after he lost weight Neuropathy: DX 05-2016, feet care discussed HTN Hyperlipidemia  Morbid Obesity Renal insufficiency, sees nephrology, Creat ~1.9 BCC 2013 Dr. Allyson Sabal, sees derm q year DOE 2013, saw cards echo diastolic dysfx,Rx---weight loss and exercise DOE 2018: Chest x-ray and echo negative, could not afford stress test. rx observation  LE pain, 2013: CKs normal, vascular exam was normal  PLAN: DM, HTN, hyperlipidemia: Continue the same medications, checking labs. Last  cholesterol satisfactory. Chronic renal insufficiency: Saw nephrology recently Morbid obesity: Weight is stable, recommend to get help from the healthy weight and wellness clinic RTC 6 months

## 2018-07-07 NOTE — Assessment & Plan Note (Addendum)
-  Td 2018 ;  pneumonia shot 09/26/09;  prevnar 2015;  zostavax 2014; shingrix discussed,  proceed with #1 today, next in 2-3 m. 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

## 2018-07-07 NOTE — Patient Instructions (Signed)
GO TO THE LAB : Get the blood work     GO TO THE FRONT DESK Schedule your next appointment for a   checkup in 6 months  You got the Shingrix No. 1 today  The next is in 2 or 3 months, please make an appointment

## 2018-07-07 NOTE — Progress Notes (Signed)
Pre visit review using our clinic review tool, if applicable. No additional management support is needed unless otherwise documented below in the visit note. 

## 2018-07-08 LAB — HEMOGLOBIN A1C
EAG (MMOL/L): 7.6 (calc)
Hgb A1c MFr Bld: 6.4 % of total Hgb — ABNORMAL HIGH (ref <5.7)
MEAN PLASMA GLUCOSE: 137 (calc)

## 2018-07-08 LAB — CBC WITH DIFFERENTIAL/PLATELET
BASOS ABS: 61 {cells}/uL (ref 0–200)
Basophils Relative: 0.8 %
EOS ABS: 350 {cells}/uL (ref 15–500)
Eosinophils Relative: 4.6 %
HEMATOCRIT: 43.6 % (ref 38.5–50.0)
HEMOGLOBIN: 14.8 g/dL (ref 13.2–17.1)
LYMPHS ABS: 1474 {cells}/uL (ref 850–3900)
MCH: 31.8 pg (ref 27.0–33.0)
MCHC: 33.9 g/dL (ref 32.0–36.0)
MCV: 93.6 fL (ref 80.0–100.0)
MPV: 10.3 fL (ref 7.5–12.5)
Monocytes Relative: 9.1 %
NEUTROS PCT: 66.1 %
Neutro Abs: 5024 cells/uL (ref 1500–7800)
Platelets: 266 10*3/uL (ref 140–400)
RBC: 4.66 10*6/uL (ref 4.20–5.80)
RDW: 12.4 % (ref 11.0–15.0)
Total Lymphocyte: 19.4 %
WBC mixed population: 692 cells/uL (ref 200–950)
WBC: 7.6 10*3/uL (ref 3.8–10.8)

## 2018-07-08 LAB — COMPREHENSIVE METABOLIC PANEL
AG RATIO: 1.6 (calc) (ref 1.0–2.5)
ALBUMIN MSPROF: 4.2 g/dL (ref 3.6–5.1)
ALT: 17 U/L (ref 9–46)
AST: 15 U/L (ref 10–35)
Alkaline phosphatase (APISO): 61 U/L (ref 40–115)
BUN / CREAT RATIO: 15 (calc) (ref 6–22)
BUN: 27 mg/dL — ABNORMAL HIGH (ref 7–25)
CHLORIDE: 103 mmol/L (ref 98–110)
CO2: 26 mmol/L (ref 20–32)
CREATININE: 1.85 mg/dL — AB (ref 0.70–1.25)
Calcium: 9.2 mg/dL (ref 8.6–10.3)
GLOBULIN: 2.7 g/dL (ref 1.9–3.7)
GLUCOSE: 96 mg/dL (ref 65–99)
POTASSIUM: 4.6 mmol/L (ref 3.5–5.3)
Sodium: 137 mmol/L (ref 135–146)
Total Bilirubin: 0.8 mg/dL (ref 0.2–1.2)
Total Protein: 6.9 g/dL (ref 6.1–8.1)

## 2018-07-09 NOTE — Assessment & Plan Note (Signed)
DM, HTN, hyperlipidemia: Continue the same medications, checking labs. Last  cholesterol satisfactory. Chronic renal insufficiency: Saw nephrology recently Morbid obesity: Weight is stable, recommend to get help from the healthy weight and wellness clinic RTC 6 months

## 2018-07-19 ENCOUNTER — Other Ambulatory Visit: Payer: Self-pay | Admitting: Internal Medicine

## 2018-07-19 MED FILL — HYDROCHLOROTHIAZIDE 25 MG T: 25 | 30 days supply | Qty: 30 | Fill #0

## 2018-07-19 MED FILL — METOPROLOL TARTRATE 50 MG T: 50 | 30 days supply | Qty: 60 | Fill #1

## 2018-07-19 MED FILL — VICTOZA 2-PAK 18 MG/3 ML PE: 18 | 30 days supply | Qty: 6 | Fill #10

## 2018-07-19 MED FILL — LOSARTAN POTASSIUM 100 MG T: 100 | 30 days supply | Qty: 30 | Fill #0

## 2018-07-19 MED FILL — PIOGLITAZONE HCL 30 MG TAB: 30 | 30 days supply | Qty: 15 | Fill #3

## 2018-07-19 MED FILL — ATORVASTATIN 10 MG TABLET: 10 | 90 days supply | Qty: 45 | Fill #0

## 2018-08-18 MED FILL — VICTOZA 2-PAK 18 MG/3 ML PE: 18 | 30 days supply | Qty: 6 | Fill #11

## 2018-08-18 MED FILL — PIOGLITAZONE HCL 30 MG TAB: 30 | 30 days supply | Qty: 15 | Fill #4

## 2018-08-18 MED FILL — LOSARTAN POTASSIUM 100 MG T: 100 | 30 days supply | Qty: 30 | Fill #1

## 2018-08-18 MED FILL — HYDROCHLOROTHIAZIDE 25 MG T: 25 | 30 days supply | Qty: 30 | Fill #1

## 2018-08-18 MED FILL — METOPROLOL TARTRATE 50 MG T: 50 | 30 days supply | Qty: 60 | Fill #2

## 2018-09-18 MED FILL — VICTOZA 2-PAK 18 MG/3 ML PE: 18 | 30 days supply | Qty: 6 | Fill #12

## 2018-09-18 MED FILL — LOSARTAN POTASSIUM 100 MG T: 100 | 30 days supply | Qty: 30 | Fill #2

## 2018-09-18 MED FILL — METOPROLOL TARTRATE 50 MG T: 50 | 30 days supply | Qty: 60 | Fill #3

## 2018-09-18 MED FILL — HYDROCHLOROTHIAZIDE 25 MG T: 25 | 30 days supply | Qty: 30 | Fill #2

## 2018-09-18 MED FILL — PIOGLITAZONE HCL 30 MG TAB: 30 | 30 days supply | Qty: 15 | Fill #5

## 2018-10-03 ENCOUNTER — Ambulatory Visit (INDEPENDENT_AMBULATORY_CARE_PROVIDER_SITE_OTHER): Payer: No Typology Code available for payment source | Admitting: *Deleted

## 2018-10-03 DIAGNOSIS — Z23 Encounter for immunization: Secondary | ICD-10-CM | POA: Diagnosis not present

## 2018-10-03 NOTE — Progress Notes (Signed)
Patient here for second shingles vaccine.  ? ?Vaccine given and patient tolerated well. ?

## 2018-10-05 ENCOUNTER — Other Ambulatory Visit: Payer: Self-pay | Admitting: Internal Medicine

## 2018-10-05 MED FILL — NOVOFINE 32G NEEDLES: 32G X 6 MM | 90 days supply | Qty: 100 | Fill #1

## 2018-10-11 MED FILL — VICTOZA 2-PAK 18 MG/3 ML PE: 18 | 60 days supply | Qty: 12 | Fill #0

## 2018-10-16 ENCOUNTER — Other Ambulatory Visit: Payer: Self-pay | Admitting: Internal Medicine

## 2018-10-16 MED FILL — ATORVASTATIN 10 MG TABLET: 10 | 90 days supply | Qty: 45 | Fill #1

## 2018-10-16 MED FILL — PIOGLITAZONE HCL 30 MG TAB: 30 | 90 days supply | Qty: 45 | Fill #0

## 2018-10-19 MED FILL — LOSARTAN POTASSIUM 100 MG T: 100 | 30 days supply | Qty: 30 | Fill #3

## 2018-10-19 MED FILL — HYDROCHLOROTHIAZIDE 25 MG T: 25 | 30 days supply | Qty: 30 | Fill #3

## 2018-10-19 MED FILL — METOPROLOL TARTRATE 50 MG T: 50 | 30 days supply | Qty: 60 | Fill #4

## 2018-11-21 MED FILL — METOPROLOL TARTRATE 50 MG T: 50 | 30 days supply | Qty: 60 | Fill #5

## 2018-11-21 MED FILL — LOSARTAN POTASSIUM 100 MG T: 100 | 30 days supply | Qty: 30 | Fill #4

## 2018-11-21 MED FILL — HYDROCHLOROTHIAZIDE 25 MG T: 25 | 30 days supply | Qty: 30 | Fill #4

## 2018-11-29 MED FILL — VICTOZA 2-PAK 18 MG/3 ML PE: 18 | 60 days supply | Qty: 12 | Fill #1

## 2018-12-21 ENCOUNTER — Other Ambulatory Visit: Payer: Self-pay | Admitting: Internal Medicine

## 2018-12-21 MED FILL — NOVOFINE 32G NEEDLES: 32G X 6 MM | 90 days supply | Qty: 100 | Fill #2

## 2018-12-22 MED FILL — LOSARTAN POTASSIUM 100 MG T: 100 | 30 days supply | Qty: 30 | Fill #0

## 2018-12-22 MED FILL — HYDROCHLOROTHIAZIDE 25 MG T: 25 | 30 days supply | Qty: 30 | Fill #0

## 2018-12-22 MED FILL — METOPROLOL TARTRATE 50 MG T: 50 | 30 days supply | Qty: 60 | Fill #0

## 2019-01-08 ENCOUNTER — Encounter: Payer: Self-pay | Admitting: Internal Medicine

## 2019-01-09 ENCOUNTER — Other Ambulatory Visit: Payer: Self-pay

## 2019-01-09 ENCOUNTER — Ambulatory Visit (INDEPENDENT_AMBULATORY_CARE_PROVIDER_SITE_OTHER): Payer: PRIVATE HEALTH INSURANCE | Admitting: Internal Medicine

## 2019-01-09 DIAGNOSIS — E785 Hyperlipidemia, unspecified: Secondary | ICD-10-CM | POA: Diagnosis not present

## 2019-01-09 DIAGNOSIS — I10 Essential (primary) hypertension: Secondary | ICD-10-CM

## 2019-01-09 DIAGNOSIS — F528 Other sexual dysfunction not due to a substance or known physiological condition: Secondary | ICD-10-CM | POA: Diagnosis not present

## 2019-01-09 DIAGNOSIS — E118 Type 2 diabetes mellitus with unspecified complications: Secondary | ICD-10-CM

## 2019-01-09 MED ORDER — SILDENAFIL CITRATE 20 MG PO TABS: 80.0000 mg | ORAL_TABLET | Freq: Every evening | ORAL | 3 refills | Status: DC | PRN

## 2019-01-09 MED FILL — SILDENAFIL CITRATE 20 MG TA: 20 | 6 days supply | Qty: 30 | Fill #0

## 2019-01-09 NOTE — Assessment & Plan Note (Signed)
DM: Continue Victoza, Actos.  Diet has improved, ambulatory CBGs in the low 100s.  Check A1c HTN: Continue hydrochlorothiazide, losartan, metoprolol.  Ambulatory BPs under excellent control in the 110s, 115's.  Check a BMP High cholesterol: On Lipitor 5 mg daily, check a FLP, last LFTs normal Morbid obesity: Doing great with diet, decrease carbohydrates, increase protein intake.  Not very active due to working schedule.  Weight dropped from the 280s few months ago to 267 today.Praised! ED: Reports a previously tried sildenafil and later on Cialis, they work without apparent side effects.  Due to cost we agreed to try generic sildenafil.  Rx sent.  Same precautions Plan: Blood work this week RTC 07-2019 CPX, will schedule

## 2019-01-09 NOTE — Progress Notes (Signed)
Subjective:    Patient ID: Jimmy [name], male    DOB: 02-Jan-1951, 68 y.o.   MRN: [id number]  DOS:  01/09/2019 Type of visit - description: Attempted  to make this a video visit, due to technical difficulties from the patient side it was not possible  thus we proceeded with a Virtual Visit via Telephone    I connected with@ on 01/09/19 at  8:20 AM EDT by telephone and verified that I am speaking with the correct person using two identifiers.  THIS ENCOUNTER IS A VIRTUAL VISIT DUE TO COVID-19 - PATIENT WAS NOT SEEN IN THE OFFICE. PATIENT HAS CONSENTED TO VIRTUAL VISIT / TELEMEDICINE VISIT   Location of patient: home  Location of provider: office  I discussed the limitations, risks, security and privacy concerns of performing an evaluation and management service by telephone and the availability of in person appointments. I also discussed with the patient that there may be a patient responsible charge related to this service. The patient expressed understanding and agreed to proceed.   History of Present Illness: Routine visit Morbid obesity: Changed his diet, less carbohydrate, more proteins, has lost weight. DM: Reviewed his medications and CBGs. HTN: Ambulatory BPs are very good ED: Currently not taking any medication, would like a refill, has used sildenafil and Cialis before.    Review of Systems Denies fever chills No chest pain no difficulty breathing No nausea, vomiting, diarrhea. No cough Some stress due to COVID-19, he is working for home and managing  well.  Following all the precautions  Past Medical History:  Diagnosis Date  . BCC (basal cell carcinoma of skin) 2013   dr Allyson Sabal   . CKD (chronic kidney disease), stage III ([place])   . Diabetes mellitus   . Hypertension   . Obesity   . Renal insufficiency    see nephrology yearly     Past Surgical History:  Procedure Laterality Date  . pilonidal cyst surgery    . POLYPECTOMY      Social History    Socioeconomic History  . Marital status: Married    Spouse name: Not on file  . Number of children: 4  . Years of education: Not on file  . Highest education level: Not on file  Occupational History  . Occupation: Building control surveyor, Pharmacist, hospital @ [employer]   Social Needs  . Financial resource strain: Not on file  . Food insecurity:    Worry: Not on file    Inability: Not on file  . Transportation needs:    Medical: Not on file    Non-medical: Not on file  Tobacco Use  . Smoking status: Never Smoker  . Smokeless tobacco: Never Used  Substance and Sexual Activity  . Alcohol use: Yes    Alcohol/week: 0.0 standard drinks    Comment: 6 beers a year -rare   . Drug use: No  . Sexual activity: Not on file  Lifestyle  . Physical activity:    Days per week: Not on file    Minutes per session: Not on file  . Stress: Not on file  Relationships  . Social connections:    Talks on phone: Not on file    Gets together: Not on file    Attends religious service: Not on file    Active member of club or organization: Not on file    Attends meetings of clubs or organizations: Not on file    Relationship status: Not on file  . Intimate partner  violence:    Fear of current or ex partner: Not on file    Emotionally abused: Not on file    Physically abused: Not on file    Forced sexual activity: Not on file  Other Topics Concern  . Not on file  Social History Narrative   Household- pt, wife   Grown children , 7 g-children            Allergies as of 01/09/2019      Reactions   Benazepril Hcl    REACTION: cough   Valsartan Other (See Comments)   Unknown reaction      Medication List       Accurate as of Jan 09, 2019  4:29 PM. If you have any questions, ask your nurse or doctor.        STOP taking these medications   amLODipine 10 MG tablet Commonly known as:  NORVASC Stopped by:  Kathlene November, MD   Garlic 2751 MG Caps Stopped by:  Kathlene November, MD   Potassium 99 MG Tabs Stopped by:   Kathlene November, MD   PREBIOTIC PRODUCT PO Stopped by:  Kathlene November, MD     TAKE these medications   aspirin 81 MG tablet Take 81 mg by mouth daily. Reported on 01/13/2016   atorvastatin 10 MG tablet Commonly known as:  LIPITOR Take 0.5 tablets (5 mg total) by mouth at bedtime.   HM Super Vitamin B12 2500 MCG Chew Generic drug:  Cyanocobalamin Chew 2,500 mcg by mouth daily.   hydrochlorothiazide 25 MG tablet Commonly known as:  HYDRODIURIL Take 1 tablet (25 mg total) by mouth daily.   liraglutide 18 MG/3ML Sopn Commonly known as:  Victoza Inject 0.2 mLs (1.2 mg total) into the skin daily.   losartan 100 MG tablet Commonly known as:  COZAAR Take 1 tablet (100 mg total) by mouth daily.   metoprolol tartrate 50 MG tablet Commonly known as:  LOPRESSOR Take 1 tablet (50 mg total) by mouth 2 (two) times daily.   pioglitazone 30 MG tablet Commonly known as:  ACTOS Take 0.5 tablets (15 mg total) by mouth daily.   sildenafil 20 MG tablet Commonly known as:  REVATIO Take 4-5 tablets (80-100 mg total) by mouth at bedtime as needed. Started by:  Kathlene November, MD   TechLite Pen Needles 32G X 6 MM Misc Generic drug:  Insulin Pen Needle USE TO INJECT VICTOZA   Vitamin D3 25 MCG (1000 UT) Caps Take 1,000 Units by mouth daily.           Objective:   Physical Exam There were no vitals taken for this visit. Blood pressure 115/76, pulse 65, weight 267 This is a telephone visit, alert oriented x3, no apparent distress    Assessment     Assessment  DM -- used to be on glimepiride, dc after he lost weight Neuropathy: DX 05-2016, feet care discussed HTN Hyperlipidemia  Morbid Obesity Renal insufficiency, sees nephrology, Creat ~1.9 BCC 2013 Dr. Allyson Sabal, sees derm q year DOE 2013, saw cards echo diastolic dysfx,Rx---weight loss and exercise DOE 2018: Chest x-ray and echo negative, could not afford stress test. rx observation  LE pain, 2013: CKs normal, vascular exam was normal   PLAN: DM: Continue Victoza, Actos.  Diet has improved, ambulatory CBGs in the low 100s.  Check A1c HTN: Continue hydrochlorothiazide, losartan, metoprolol.  Ambulatory BPs under excellent control in the 110s, 115's.  Check a BMP High cholesterol: On Lipitor 5 mg daily, check a  FLP, last LFTs normal Morbid obesity: Doing great with diet, decrease carbohydrates, increase protein intake.  Not very active due to working schedule.  Weight dropped from the 280s few months ago to 267 today.Praised! ED: Reports a previously tried sildenafil and later on Cialis, they work without apparent side effects.  Due to cost we agreed to try generic sildenafil.  Rx sent.  Same precautions Plan: Blood work this week RTC 07-2019 CPX, will schedule  I discussed the assessment and treatment plan with the patient. The patient was provided an opportunity to ask questions and all were answered. The patient agreed with the plan and demonstrated an understanding of the instructions.   The patient was advised to call back or seek an in-person evaluation if the symptoms worsen or if the condition fails to improve as anticipated.  I provided 25 minutes of non-face-to-face time during this encounter.  Kathlene November, MD

## 2019-01-10 ENCOUNTER — Telehealth: Payer: Self-pay

## 2019-01-10 NOTE — Telephone Encounter (Signed)
PA initiated via Covermymeds; KEY: [id number]. Awaiting determination.

## 2019-01-10 NOTE — Telephone Encounter (Signed)
PA approved.   Request Reference Number: [id number]. VICTOZA INJ 18MG /3ML is approved through 01/10/2020. For further questions, call [phone]

## 2019-01-11 MED FILL — PIOGLITAZONE HCL 30 MG TAB: 30 | 90 days supply | Qty: 45 | Fill #1

## 2019-01-11 MED FILL — ATORVASTATIN 10 MG TABLET: 10 | 90 days supply | Qty: 45 | Fill #2

## 2019-01-12 ENCOUNTER — Other Ambulatory Visit: Payer: Self-pay

## 2019-01-12 ENCOUNTER — Other Ambulatory Visit (INDEPENDENT_AMBULATORY_CARE_PROVIDER_SITE_OTHER): Payer: PRIVATE HEALTH INSURANCE

## 2019-01-12 DIAGNOSIS — E785 Hyperlipidemia, unspecified: Secondary | ICD-10-CM

## 2019-01-12 DIAGNOSIS — I10 Essential (primary) hypertension: Secondary | ICD-10-CM

## 2019-01-12 DIAGNOSIS — E118 Type 2 diabetes mellitus with unspecified complications: Secondary | ICD-10-CM | POA: Diagnosis not present

## 2019-01-12 LAB — BASIC METABOLIC PANEL
BUN: 36 mg/dL — ABNORMAL HIGH (ref 6–23)
CO2: 27 mEq/L (ref 19–32)
Calcium: 9.1 mg/dL (ref 8.4–10.5)
Chloride: 101 mEq/L (ref 96–112)
Creatinine, Ser: 1.87 mg/dL — ABNORMAL HIGH (ref 0.40–1.50)
GFR: 36.16 mL/min — ABNORMAL LOW (ref >60.00)
Glucose, Bld: 98 mg/dL (ref 70–99)
Potassium: 4.4 mEq/L (ref 3.5–5.1)
Sodium: 137 mEq/L (ref 135–145)

## 2019-01-12 LAB — LIPID PANEL
Cholesterol: 113 mg/dL (ref 0–200)
HDL: 45.5 mg/dL (ref >39.00)
LDL Cholesterol: 54 mg/dL (ref 0–99)
NonHDL: 67.33
Total CHOL/HDL Ratio: 2
Triglycerides: 66 mg/dL (ref 0.0–149.0)
VLDL: 13.2 mg/dL (ref 0.0–40.0)

## 2019-01-12 LAB — HEMOGLOBIN A1C: Hgb A1c MFr Bld: 5.9 % (ref 4.6–6.5)

## 2019-01-18 MED FILL — HYDROCHLOROTHIAZIDE 25 MG T: 25 | 30 days supply | Qty: 30 | Fill #1

## 2019-01-18 MED FILL — LOSARTAN POTASSIUM 100 MG T: 100 | 30 days supply | Qty: 30 | Fill #1

## 2019-01-18 MED FILL — VICTOZA 2-PAK 18 MG/3 ML PE: 18 | 60 days supply | Qty: 12 | Fill #2

## 2019-01-18 MED FILL — METOPROLOL TARTRATE 50 MG T: 50 | 30 days supply | Qty: 60 | Fill #1

## 2019-02-14 MED FILL — LOSARTAN POTASSIUM 100 MG T: 100 | 30 days supply | Qty: 30 | Fill #2

## 2019-02-14 MED FILL — METOPROLOL TARTRATE 50 MG T: 50 | 30 days supply | Qty: 60 | Fill #2

## 2019-02-14 MED FILL — HYDROCHLOROTHIAZIDE 25 MG T: 25 | 30 days supply | Qty: 30 | Fill #2

## 2019-03-20 MED FILL — LOSARTAN POTASSIUM 100 MG T: 100 | 30 days supply | Qty: 30 | Fill #3

## 2019-03-20 MED FILL — HYDROCHLOROTHIAZIDE 25 MG T: 25 | 30 days supply | Qty: 30 | Fill #3

## 2019-03-20 MED FILL — VICTOZA 2-PAK 18 MG/3 ML PE: 18 | 60 days supply | Qty: 12 | Fill #3

## 2019-03-20 MED FILL — METOPROLOL TARTRATE 50 MG T: 50 | 30 days supply | Qty: 60 | Fill #3

## 2019-04-02 ENCOUNTER — Other Ambulatory Visit: Payer: Self-pay | Admitting: Internal Medicine

## 2019-04-02 MED FILL — NOVOFINE 32G NEEDLES: 32G X 6 MM | 90 days supply | Qty: 100 | Fill #0

## 2019-04-03 ENCOUNTER — Other Ambulatory Visit: Payer: Self-pay | Admitting: Internal Medicine

## 2019-04-03 MED FILL — ATORVASTATIN 10 MG TABLET: 10 | 90 days supply | Qty: 45 | Fill #0

## 2019-04-19 MED FILL — LOSARTAN POTASSIUM 100 MG T: 100 | 30 days supply | Qty: 30 | Fill #4

## 2019-04-19 MED FILL — METOPROLOL TARTRATE 50 MG T: 50 | 30 days supply | Qty: 60 | Fill #4

## 2019-04-19 MED FILL — HYDROCHLOROTHIAZIDE 25 MG T: 25 | 30 days supply | Qty: 30 | Fill #4

## 2019-04-23 LAB — HM DIABETES EYE EXAM

## 2019-04-25 ENCOUNTER — Encounter: Payer: Self-pay | Admitting: Internal Medicine

## 2019-05-11 ENCOUNTER — Other Ambulatory Visit: Payer: Self-pay

## 2019-05-11 ENCOUNTER — Ambulatory Visit (INDEPENDENT_AMBULATORY_CARE_PROVIDER_SITE_OTHER): Payer: PRIVATE HEALTH INSURANCE

## 2019-05-11 DIAGNOSIS — Z23 Encounter for immunization: Secondary | ICD-10-CM | POA: Diagnosis not present

## 2019-05-11 MED FILL — VICTOZA 2-PAK 18 MG/3 ML PE: 18 | 60 days supply | Qty: 12 | Fill #4

## 2019-05-21 MED FILL — LOSARTAN POTASSIUM 100 MG T: 100 | 30 days supply | Qty: 30 | Fill #5

## 2019-05-21 MED FILL — HYDROCHLOROTHIAZIDE 25 MG T: 25 | 30 days supply | Qty: 30 | Fill #5

## 2019-05-21 MED FILL — METOPROLOL TARTRATE 50 MG T: 50 | 30 days supply | Qty: 60 | Fill #5

## 2019-06-13 MED FILL — SILDENAFIL CITRATE 20 MG TA: 20 | 6 days supply | Qty: 30 | Fill #1

## 2019-06-21 ENCOUNTER — Other Ambulatory Visit: Payer: Self-pay | Admitting: Internal Medicine

## 2019-06-21 MED FILL — LOSARTAN POTASSIUM 100 MG T: 100 | 90 days supply | Qty: 90 | Fill #0

## 2019-07-02 MED FILL — VICTOZA 2-PAK 18 MG/3 ML PE: 18 | 60 days supply | Qty: 12 | Fill #5

## 2019-07-02 MED FILL — NOVOFINE 32G NEEDLES: 32G X 6 MM | 90 days supply | Qty: 100 | Fill #1

## 2019-07-02 MED FILL — ATORVASTATIN 10 MG TABLET: 10 | 90 days supply | Qty: 45 | Fill #1

## 2019-07-16 ENCOUNTER — Other Ambulatory Visit: Payer: Self-pay | Admitting: Internal Medicine

## 2019-07-16 MED FILL — METOPROLOL TARTRATE 50 MG T: 50 | 90 days supply | Qty: 180 | Fill #0

## 2019-07-19 ENCOUNTER — Other Ambulatory Visit: Payer: Self-pay

## 2019-07-20 ENCOUNTER — Other Ambulatory Visit: Payer: Self-pay

## 2019-07-20 ENCOUNTER — Ambulatory Visit (INDEPENDENT_AMBULATORY_CARE_PROVIDER_SITE_OTHER): Payer: PRIVATE HEALTH INSURANCE | Admitting: Internal Medicine

## 2019-07-20 ENCOUNTER — Encounter: Payer: Self-pay | Admitting: Internal Medicine

## 2019-07-20 VITALS — BP 130/55 | HR 66 | Temp 95.6°F | Resp 16 | Ht 70.0 in | Wt 255.2 lb

## 2019-07-20 DIAGNOSIS — E785 Hyperlipidemia, unspecified: Secondary | ICD-10-CM | POA: Diagnosis not present

## 2019-07-20 DIAGNOSIS — Z0000 Encounter for general adult medical examination without abnormal findings: Secondary | ICD-10-CM | POA: Diagnosis not present

## 2019-07-20 DIAGNOSIS — E118 Type 2 diabetes mellitus with unspecified complications: Secondary | ICD-10-CM | POA: Diagnosis not present

## 2019-07-20 LAB — CBC WITH DIFFERENTIAL/PLATELET
Basophils Absolute: 0.1 10*3/uL (ref 0.0–0.1)
Basophils Relative: 1 % (ref 0.0–3.0)
Eosinophils Absolute: 0.3 10*3/uL (ref 0.0–0.7)
Eosinophils Relative: 6.1 % — ABNORMAL HIGH (ref 0.0–5.0)
HCT: 45.9 % (ref 39.0–52.0)
Hemoglobin: 15.4 g/dL (ref 13.0–17.0)
Lymphocytes Relative: 21.4 % (ref 12.0–46.0)
Lymphs Abs: 1.2 10*3/uL (ref 0.7–4.0)
MCHC: 33.5 g/dL (ref 30.0–36.0)
MCV: 95 fl (ref 78.0–100.0)
Monocytes Absolute: 0.6 10*3/uL (ref 0.1–1.0)
Monocytes Relative: 10.8 % (ref 3.0–12.0)
Neutro Abs: 3.4 10*3/uL (ref 1.4–7.7)
Neutrophils Relative %: 60.7 % (ref 43.0–77.0)
Platelets: 214 10*3/uL (ref 150.0–400.0)
RBC: 4.84 Mil/uL (ref 4.22–5.81)
RDW: 13.3 % (ref 11.5–15.5)
WBC: 5.6 10*3/uL (ref 4.0–10.5)

## 2019-07-20 LAB — COMPREHENSIVE METABOLIC PANEL
ALT: 15 U/L (ref 0–53)
AST: 14 U/L (ref 0–37)
Albumin: 4.4 g/dL (ref 3.5–5.2)
Alkaline Phosphatase: 55 U/L (ref 39–117)
BUN: 35 mg/dL — ABNORMAL HIGH (ref 6–23)
CO2: 26 mEq/L (ref 19–32)
Calcium: 9.1 mg/dL (ref 8.4–10.5)
Chloride: 106 mEq/L (ref 96–112)
Creatinine, Ser: 1.76 mg/dL — ABNORMAL HIGH (ref 0.40–1.50)
GFR: 38.72 mL/min — ABNORMAL LOW (ref >60.00)
Glucose, Bld: 111 mg/dL — ABNORMAL HIGH (ref 70–99)
Potassium: 4.7 mEq/L (ref 3.5–5.1)
Sodium: 139 mEq/L (ref 135–145)
Total Bilirubin: 0.9 mg/dL (ref 0.2–1.2)
Total Protein: 6.8 g/dL (ref 6.0–8.3)

## 2019-07-20 LAB — HEMOGLOBIN A1C: Hgb A1c MFr Bld: 5.7 % (ref 4.6–6.5)

## 2019-07-20 LAB — PSA: PSA: 1.32 ng/mL (ref 0.10–4.00)

## 2019-07-20 NOTE — Assessment & Plan Note (Signed)
-  Td 2018 -  pneumonia shot 09/26/09  - prevnar 2015 -  zostavax 2014 - s/p shingrix x 2 - s/p Flu shot -CCS: Colonoscopy 01/2010, tubular adenoma; cscope again 02-2015, next 10 years per report -Prostate cancer screening:  DRE normal, check a PSA -Diet and exercise: Losing a significant amount of weight on lower carbohydrate diet. - labs: CMP, A1c, CBC, PSA

## 2019-07-20 NOTE — Patient Instructions (Signed)
GO TO THE LAB : Get the blood work     GO TO THE FRONT DESK Schedule your next appointment for a   checkup in 6 months 

## 2019-07-20 NOTE — Progress Notes (Signed)
Subjective:    Patient ID: Jimmy [name], male    DOB: 12-04-1950, 68 y.o.   MRN: [id number]  DOS:  07/20/2019 Type of visit - description: CPX Since the last office visit he is feeling great. He continue with his low carbohydrate diet and has lost a significant amount of weight.  Wt Readings from Last 3 Encounters:  07/20/19 255 lb 4 oz (115.8 kg)  07/07/18 (!) 306 lb (138.8 kg)  03/29/18 (!) 305 lb 8 oz (138.6 kg)     Review of Systems Reports generalized skin itching, no rash per se, reports his skin is very dry.  Other than above, a 14 point review of systems is negative    Past Medical History:  Diagnosis Date  . BCC (basal cell carcinoma of skin) 2013   dr Allyson Sabal   . CKD (chronic kidney disease), stage III   . Diabetes mellitus   . Hypertension   . Obesity     Past Surgical History:  Procedure Laterality Date  . pilonidal cyst surgery    . POLYPECTOMY      Social History   Socioeconomic History  . Marital status: Married    Spouse name: Not on file  . Number of children: 4  . Years of education: Not on file  . Highest education level: Not on file  Occupational History  . Occupation: Building control surveyor, Pharmacist, hospital @ [employer]   Social Needs  . Financial resource strain: Not on file  . Food insecurity    Worry: Not on file    Inability: Not on file  . Transportation needs    Medical: Not on file    Non-medical: Not on file  Tobacco Use  . Smoking status: Never Smoker  . Smokeless tobacco: Never Used  Substance and Sexual Activity  . Alcohol use: Yes    Alcohol/week: 0.0 standard drinks    Comment: 6 beers a year -rare   . Drug use: No  . Sexual activity: Not on file  Lifestyle  . Physical activity    Days per week: Not on file    Minutes per session: Not on file  . Stress: Not on file  Relationships  . Social Herbalist on phone: Not on file    Gets together: Not on file    Attends religious service: Not on file    Active member of club  or organization: Not on file    Attends meetings of clubs or organizations: Not on file    Relationship status: Not on file  . Intimate partner violence    Fear of current or ex partner: Not on file    Emotionally abused: Not on file    Physically abused: Not on file    Forced sexual activity: Not on file  Other Topics Concern  . Not on file  Social History Narrative   Household- pt, wife   Grown children , 7 g-children           Family History  Problem Relation Age of Onset  . Kidney failure Father   . Heart attack Other        GF 48s y/o  . Cancer Mother        type?  . Colon cancer Neg Hx   . Prostate cancer Neg Hx   . Diabetes Neg Hx   . Colon polyps Neg Hx   . Rectal cancer Neg Hx   . Stomach cancer Neg Hx   .  Thyroid cancer Neg Hx      Allergies as of 07/20/2019      Reactions   Benazepril Hcl    REACTION: cough   Valsartan Other (See Comments)   Unknown reaction      Medication List       Accurate as of July 20, 2019 11:59 PM. If you have any questions, ask your nurse or doctor.        STOP taking these medications   HM Super Vitamin B12 2500 MCG Chew Generic drug: Cyanocobalamin Stopped by: Kathlene November, MD     TAKE these medications   aspirin 81 MG tablet Take 81 mg by mouth daily. Reported on 01/13/2016   atorvastatin 10 MG tablet Commonly known as: LIPITOR Take 0.5 tablets (5 mg total) by mouth at bedtime.   hydrochlorothiazide 25 MG tablet Commonly known as: HYDRODIURIL Take 1 tablet (25 mg total) by mouth daily.   liraglutide 18 MG/3ML Sopn Commonly known as: Victoza Inject 0.2 mLs (1.2 mg total) into the skin daily.   losartan 100 MG tablet Commonly known as: COZAAR Take 1 tablet (100 mg total) by mouth daily.   metoprolol tartrate 50 MG tablet Commonly known as: LOPRESSOR Take 0.5 tablets (25 mg total) by mouth 2 (two) times daily.   NovoFine 32G X 6 MM Misc Generic drug: Insulin Pen Needle USE TO INJECT VICTOZA    pioglitazone 30 MG tablet Commonly known as: ACTOS Take 0.5 tablets (15 mg total) by mouth daily.   sildenafil 20 MG tablet Commonly known as: REVATIO Take 4-5 tablets (80-100 mg total) by mouth at bedtime as needed.   Vitamin D3 25 MCG (1000 UT) Caps Take 1,000 Units by mouth daily.           Objective:   Physical Exam BP (!) 130/55 (BP Location: Left Arm, Patient Position: Sitting, Cuff Size: Normal)   Pulse 66   Temp (!) 95.6 F (35.3 C) (Temporal)   Resp 16   Ht 5\' 10"  (1.778 m)   Wt 255 lb 4 oz (115.8 kg)   SpO2 100%   BMI 36.62 kg/m  General: Well developed, NAD, BMI noted Neck: No  thyromegaly  HEENT:  Normocephalic . Face symmetric, atraumatic Lungs:  CTA B Normal respiratory effort, no intercostal retractions, no accessory muscle use. Heart: RRR,  no murmur.  No pretibial edema bilaterally  Abdomen:  Not distended, soft, non-tender. No rebound or rigidity.   Skin: Skin is dry throughout.  No rash DRE: Normal sphincter tone, brown stools, normal prostate Neurologic:  alert & oriented X3.  Speech normal, gait appropriate for age and unassisted Strength symmetric and appropriate for age.  Psych: Cognition and judgment appear intact.  Cooperative with normal attention span and concentration.  Behavior appropriate. No anxious or depressed appearing.     Assessment     Assessment  DM -- used to be on glimepiride, dc after he lost weight Neuropathy: DX 05-2016, feet care discussed HTN Hyperlipidemia  Morbid Obesity Renal insufficiency, sees nephrology, Creat ~1.9 BCC 2013 Dr. Allyson Sabal, sees derm q year DOE 2013, saw cards echo diastolic dysfx,Rx---weight loss and exercise DOE 2018: Chest x-ray and echo negative, could not afford stress test. rx observation  LE pain, 2013: CKs normal, vascular exam was normal  PLAN: For CPX DM: On Victoza, Actos (low-dose) doing great with a low carbohydrate diet, has lost a significant amount of weight.  Check  A1c.  Could consider DC Actos HTN: Saw nephrology few months ago,  metoprolol dose decreased, continue losartan, HCTZ. High cholesterol: Controlled on Lipitor. Morbid obesity: Doing great. Praised! Dry skin: Avoid hot showers, [place] as needed RTC 6 months   This visit occurred during the SARS-CoV-2 public health emergency.  Safety protocols were in place, including screening questions prior to the visit, additional usage of staff PPE, and extensive cleaning of exam room while observing appropriate contact time as indicated for disinfecting solutions.

## 2019-07-20 NOTE — Progress Notes (Signed)
Pre visit review using our clinic review tool, if applicable. No additional management support is needed unless otherwise documented below in the visit note. 

## 2019-07-21 NOTE — Assessment & Plan Note (Signed)
For CPX DM: On Victoza, Actos (low-dose) doing great with a low carbohydrate diet, has lost a significant amount of weight.  Check A1c.  Could consider DC Actos HTN: Saw nephrology few months ago, metoprolol dose decreased, continue losartan, HCTZ. High cholesterol: Controlled on Lipitor. Morbid obesity: Doing great. Praised! Dry skin: Avoid hot showers, [place] as needed RTC 6 months

## 2019-07-23 NOTE — Addendum Note (Signed)
Addended byDamita Dunnings D on: 07/23/2019 04:47 PM   Modules accepted: Orders

## 2019-08-28 MED FILL — VICTOZA 2-PAK 18 MG/3 ML PE: 18 | 60 days supply | Qty: 12 | Fill #6

## 2019-09-19 ENCOUNTER — Other Ambulatory Visit: Payer: Self-pay | Admitting: Internal Medicine

## 2019-09-19 MED FILL — LOSARTAN POTASSIUM 100 MG T: 100 | 90 days supply | Qty: 90 | Fill #1

## 2019-09-19 MED FILL — ATORVASTATIN 10 MG TABLET: 10 | 90 days supply | Qty: 45 | Fill #0

## 2019-10-11 MED FILL — METOPROLOL TARTRATE 50 MG T: 50 | 90 days supply | Qty: 180 | Fill #1

## 2019-10-12 ENCOUNTER — Other Ambulatory Visit: Payer: Self-pay | Admitting: Internal Medicine

## 2019-10-12 MED FILL — VICTOZA 2-PAK 18 MG/3 ML PE: 18 | 60 days supply | Qty: 12 | Fill #0

## 2019-10-12 MED FILL — NOVOFINE 32G NEEDLES: 32G X 6 MM | 90 days supply | Qty: 100 | Fill #2

## 2019-12-12 ENCOUNTER — Telehealth: Payer: Self-pay

## 2019-12-12 NOTE — Telephone Encounter (Signed)
PA approved.   Request Reference Number: [id number]. VICTOZA INJ 18MG /3ML is approved through 12/11/2020. Your patient may now fill this prescription and it will be covered.

## 2019-12-12 NOTE — Telephone Encounter (Signed)
PA initiated via Covermymeds; KEY: [id number]. Awaiting determination.

## 2019-12-17 MED FILL — VICTOZA 2-PAK 18 MG/3 ML PE: 18 | 60 days supply | Qty: 12 | Fill #1

## 2019-12-20 ENCOUNTER — Other Ambulatory Visit: Payer: Self-pay | Admitting: Internal Medicine

## 2019-12-20 MED FILL — LOSARTAN POTASSIUM 100 MG T: 100 | 90 days supply | Qty: 90 | Fill #0

## 2019-12-20 MED FILL — ATORVASTATIN 10 MG TABLET: 10 | 90 days supply | Qty: 45 | Fill #1

## 2020-01-18 ENCOUNTER — Other Ambulatory Visit: Payer: Self-pay

## 2020-01-18 ENCOUNTER — Ambulatory Visit: Payer: PRIVATE HEALTH INSURANCE | Admitting: Internal Medicine

## 2020-01-18 ENCOUNTER — Encounter: Payer: Self-pay | Admitting: Internal Medicine

## 2020-01-18 VITALS — BP 126/67 | HR 62 | Temp 97.1°F | Resp 19 | Ht 70.0 in | Wt 247.2 lb

## 2020-01-18 DIAGNOSIS — I10 Essential (primary) hypertension: Secondary | ICD-10-CM

## 2020-01-18 DIAGNOSIS — E785 Hyperlipidemia, unspecified: Secondary | ICD-10-CM

## 2020-01-18 DIAGNOSIS — E118 Type 2 diabetes mellitus with unspecified complications: Secondary | ICD-10-CM

## 2020-01-18 LAB — CBC WITH DIFFERENTIAL/PLATELET
Basophils Absolute: 0.1 10*3/uL (ref 0.0–0.1)
Basophils Relative: 1.7 % (ref 0.0–3.0)
Eosinophils Absolute: 0.3 10*3/uL (ref 0.0–0.7)
Eosinophils Relative: 6.9 % — ABNORMAL HIGH (ref 0.0–5.0)
HCT: 43.3 % (ref 39.0–52.0)
Hemoglobin: 14.8 g/dL (ref 13.0–17.0)
Lymphocytes Relative: 23.2 % (ref 12.0–46.0)
Lymphs Abs: 1.2 10*3/uL (ref 0.7–4.0)
MCHC: 34.1 g/dL (ref 30.0–36.0)
MCV: 95.4 fl (ref 78.0–100.0)
Monocytes Absolute: 0.5 10*3/uL (ref 0.1–1.0)
Monocytes Relative: 10.2 % (ref 3.0–12.0)
Neutro Abs: 2.9 10*3/uL (ref 1.4–7.7)
Neutrophils Relative %: 58 % (ref 43.0–77.0)
Platelets: 192 10*3/uL (ref 150.0–400.0)
RBC: 4.54 Mil/uL (ref 4.22–5.81)
RDW: 12.9 % (ref 11.5–15.5)
WBC: 5 10*3/uL (ref 4.0–10.5)

## 2020-01-18 LAB — LIPID PANEL
Cholesterol: 117 mg/dL (ref 0–200)
HDL: 48.2 mg/dL (ref >39.00)
LDL Cholesterol: 56 mg/dL (ref 0–99)
NonHDL: 68.71
Total CHOL/HDL Ratio: 2
Triglycerides: 63 mg/dL (ref 0.0–149.0)
VLDL: 12.6 mg/dL (ref 0.0–40.0)

## 2020-01-18 LAB — BASIC METABOLIC PANEL
BUN: 34 mg/dL — ABNORMAL HIGH (ref 6–23)
CO2: 26 mEq/L (ref 19–32)
Calcium: 9.1 mg/dL (ref 8.4–10.5)
Chloride: 105 mEq/L (ref 96–112)
Creatinine, Ser: 1.7 mg/dL — ABNORMAL HIGH (ref 0.40–1.50)
GFR: 40.24 mL/min — ABNORMAL LOW (ref >60.00)
Glucose, Bld: 105 mg/dL — ABNORMAL HIGH (ref 70–99)
Potassium: 4.2 mEq/L (ref 3.5–5.1)
Sodium: 135 mEq/L (ref 135–145)

## 2020-01-18 LAB — HEMOGLOBIN A1C: Hgb A1c MFr Bld: 5.6 % (ref 4.6–6.5)

## 2020-01-18 NOTE — Patient Instructions (Addendum)
Please call the office with your COVID vaccine dates.   Continue monitoring your blood pressure, weight, blood sugars  GO TO THE LAB : Get the blood work     GO TO THE FRONT DESK, PLEASE SCHEDULE YOUR APPOINTMENTS Come back for a physical exam in 6 months

## 2020-01-18 NOTE — Assessment & Plan Note (Signed)
DM: On Victoza, Actos was discontinued/t last  A1c being 5.7.  Ambulatory CBG are excellent in the low 100s, he continue doing well with diet.  Check A1c. HTN: Ambulatory BPs are excellent, on losartan, HCTZ discontinued (per nephrology?).  Check a BMP and CBC Chronic renal insufficiency: Checking labs Neuropathy: Exam today is negative. Morbid obesity: Continue losing weight, praised! High cholesterol: On Lipitor, last LFTs normal, check a FLP Preventive care: Had Covid shots RTC 6 months CPX

## 2020-01-18 NOTE — Progress Notes (Signed)
Subjective:    Patient ID: Jimmy [name], male    DOB: [birth year], 69 y.o.   MRN: [id number]  DOS:  01/18/2020 Type of visit - description: Routine follow-up Since the last office visit he is doing great. We talk about blood pressure, diabetes, morbid obesity. He brought a log of his vital signs and weights which is reviewed.   Wt Readings from Last 3 Encounters:  01/18/20 247 lb 4 oz (112.2 kg)  07/20/19 255 lb 4 oz (115.8 kg)  07/07/18 (!) 306 lb (138.8 kg)     Review of Systems Feels great. Eating healthier. Denies lower extremity paresthesias  Past Medical History:  Diagnosis Date  . BCC (basal cell carcinoma of skin) 2013   dr Allyson Sabal   . CKD (chronic kidney disease), stage III   . Diabetes mellitus   . Hypertension   . Obesity     Past Surgical History:  Procedure Laterality Date  . pilonidal cyst surgery    . POLYPECTOMY      Allergies as of 01/18/2020      Reactions   Benazepril Hcl    REACTION: cough   Valsartan Other (See Comments)   Unknown reaction      Medication List       Accurate as of Jan 18, 2020  1:50 PM. If you have any questions, ask your nurse or doctor.        STOP taking these medications   hydrochlorothiazide 25 MG tablet Commonly known as: HYDRODIURIL Stopped by: Kathlene November, MD   sildenafil 20 MG tablet Commonly known as: REVATIO Stopped by: Kathlene November, MD     TAKE these medications   aspirin 81 MG tablet Take 81 mg by mouth daily. Reported on 01/13/2016   atorvastatin 10 MG tablet Commonly known as: LIPITOR Take 0.5 tablets (5 mg total) by mouth at bedtime.   losartan 100 MG tablet Commonly known as: COZAAR Take 1 tablet (100 mg total) by mouth daily.   metoprolol tartrate 50 MG tablet Commonly known as: LOPRESSOR Take 0.5 tablets (25 mg total) by mouth 2 (two) times daily.   NovoFine 32G X 6 MM Misc Generic drug: Insulin Pen Needle USE TO INJECT VICTOZA   tadalafil 20 MG tablet Commonly known as: CIALIS Take 20  mg by mouth daily as needed for erectile dysfunction.   Victoza 18 MG/3ML Sopn Generic drug: liraglutide Inject 0.2 mLs (1.2 mg total) into the skin daily.   Vitamin D3 25 MCG (1000 UT) Caps Take 1,000 Units by mouth daily.            Objective:   Physical Exam BP 126/67 (BP Location: Left Arm, Patient Position: Sitting, Cuff Size: Normal)   Pulse 62   Temp (!) 97.1 F (36.2 C) (Temporal)   Resp 19   Ht 5\' 10"  (1.778 m)   Wt 247 lb 4 oz (112.2 kg)   SpO2 99%   BMI 35.48 kg/m  General:   Well developed, NAD, BMI noted. HEENT:  Normocephalic . Face symmetric, atraumatic Lungs:  CTA B Normal respiratory effort, no intercostal retractions, no accessory muscle use. Heart: RRR,  no murmur.  DM foot exam: No edema, good pedal pulses, pinprick examination normal Skin: Not pale. Not jaundice Neurologic:  alert & oriented X3.  Speech normal, gait appropriate for age and unassisted Psych--  Cognition and judgment appear intact.  Cooperative with normal attention span and concentration.  Behavior appropriate. No anxious or depressed appearing.  Assessment     Assessment  DM -- used to be on glimepiride, dc after he lost weight Neuropathy: DX 05-2016, feet care discussed HTN Hyperlipidemia  Morbid Obesity Renal insufficiency, sees nephrology, Creat ~1.9 [facility] 2013 Dr. Allyson Sabal, sees derm q year DOE 2013, saw cards echo diastolic dysfx,Rx---weight loss and exercise DOE 2018: Chest x-ray and echo negative, could not afford stress test. rx observation  LE pain, 2013: CKs normal, vascular exam was normal  PLAN: DM: On Victoza, Actos was discontinued/t last  A1c being 5.7.  Ambulatory CBG are excellent in the low 100s, he continue doing well with diet.  Check A1c. HTN: Ambulatory BPs are excellent, on losartan, HCTZ discontinued (per nephrology?).  Check a BMP and CBC Chronic renal insufficiency: Checking labs Neuropathy: Exam today is negative. Morbid obesity:  Continue losing weight, praised! High cholesterol: On Lipitor, last LFTs normal, check a FLP Preventive care: Had Covid shots RTC 6 months CPX   This visit occurred during the SARS-CoV-2 public health emergency.  Safety protocols were in place, including screening questions prior to the visit, additional usage of staff PPE, and extensive cleaning of exam room while observing appropriate contact time as indicated for disinfecting solutions.

## 2020-01-18 NOTE — Progress Notes (Signed)
Pre visit review using our clinic review tool, if applicable. No additional management support is needed unless otherwise documented below in the visit note. 

## 2020-01-24 ENCOUNTER — Encounter: Payer: Self-pay | Admitting: Internal Medicine

## 2020-01-29 MED FILL — NOVOFINE 32G NEEDLES: 32G X 6 MM | 90 days supply | Qty: 100 | Fill #3

## 2020-02-11 MED FILL — VICTOZA 2-PAK 18 MG/3 ML PE: 18 | 60 days supply | Qty: 12 | Fill #2

## 2020-03-19 ENCOUNTER — Other Ambulatory Visit: Payer: Self-pay | Admitting: Internal Medicine

## 2020-03-19 MED FILL — ATORVASTATIN CALCIUM 10 MG: 10 | 90 days supply | Qty: 45 | Fill #0

## 2020-03-19 MED FILL — LOSARTAN POTASSIUM 100 MG T: 100 | 90 days supply | Qty: 90 | Fill #0

## 2020-04-10 MED FILL — VICTOZA 2-PAK 18 MG/3 ML PE: 18 | 60 days supply | Qty: 12 | Fill #3

## 2020-05-02 ENCOUNTER — Other Ambulatory Visit: Payer: Self-pay | Admitting: Internal Medicine

## 2020-05-02 MED FILL — NOVOFINE 32G NEEDLES: 32G X 6 MM | 90 days supply | Qty: 100 | Fill #0

## 2020-05-19 LAB — BASIC METABOLIC PANEL
BUN: 31 — AB (ref 4–21)
CO2: 24 — AB (ref 13–22)
Chloride: 108 (ref 99–108)
Creatinine: 1.8 — AB (ref 0.6–1.3)
Glucose: 109
Potassium: 5 (ref 3.4–5.3)
Sodium: 142 (ref 137–147)

## 2020-05-19 LAB — IRON,TIBC AND FERRITIN PANEL
%SAT: 25
Ferritin: 292
Iron: 64
TIBC: 261
UIBC: 197

## 2020-05-19 LAB — COMPREHENSIVE METABOLIC PANEL
Albumin: 4.3 (ref 3.5–5.0)
Calcium: 9 (ref 8.7–10.7)
GFR calc Af Amer: 45
GFR calc non Af Amer: 39

## 2020-05-19 LAB — CBC AND DIFFERENTIAL: Hemoglobin: 14.6 (ref 13.5–17.5)

## 2020-05-28 ENCOUNTER — Encounter: Payer: Self-pay | Admitting: Internal Medicine

## 2020-05-28 LAB — HM DIABETES EYE EXAM

## 2020-06-09 ENCOUNTER — Encounter: Payer: Self-pay | Admitting: Internal Medicine

## 2020-06-11 MED FILL — VICTOZA 2-PAK 18 MG/3 ML PE: 18 | 60 days supply | Qty: 12 | Fill #4

## 2020-06-17 ENCOUNTER — Other Ambulatory Visit: Payer: Self-pay | Admitting: Internal Medicine

## 2020-06-17 MED FILL — ATORVASTATIN CALCIUM 10 MG: 10 | 90 days supply | Qty: 45 | Fill #1

## 2020-06-19 ENCOUNTER — Other Ambulatory Visit: Payer: Self-pay | Admitting: Internal Medicine

## 2020-07-09 ENCOUNTER — Other Ambulatory Visit: Payer: Self-pay | Admitting: Internal Medicine

## 2020-07-09 MED FILL — METOPROLOL TARTRATE 50 MG T: 50 | 90 days supply | Qty: 180 | Fill #0

## 2020-07-09 MED FILL — LOSARTAN POTASSIUM 100 MG T: 100 | 90 days supply | Qty: 90 | Fill #0

## 2020-07-25 ENCOUNTER — Encounter: Payer: PRIVATE HEALTH INSURANCE | Admitting: Internal Medicine

## 2020-07-28 ENCOUNTER — Other Ambulatory Visit (HOSPITAL_BASED_OUTPATIENT_CLINIC_OR_DEPARTMENT_OTHER): Payer: Self-pay | Admitting: Internal Medicine

## 2020-07-28 ENCOUNTER — Other Ambulatory Visit: Payer: Self-pay

## 2020-07-28 ENCOUNTER — Ambulatory Visit (INDEPENDENT_AMBULATORY_CARE_PROVIDER_SITE_OTHER): Payer: PRIVATE HEALTH INSURANCE | Admitting: Internal Medicine

## 2020-07-28 ENCOUNTER — Encounter: Payer: Self-pay | Admitting: Internal Medicine

## 2020-07-28 ENCOUNTER — Ambulatory Visit: Payer: PRIVATE HEALTH INSURANCE | Attending: Internal Medicine

## 2020-07-28 VITALS — BP 118/82 | HR 54 | Temp 97.6°F | Ht 70.0 in | Wt 250.6 lb

## 2020-07-28 DIAGNOSIS — Z23 Encounter for immunization: Secondary | ICD-10-CM

## 2020-07-28 DIAGNOSIS — E118 Type 2 diabetes mellitus with unspecified complications: Secondary | ICD-10-CM | POA: Diagnosis not present

## 2020-07-28 DIAGNOSIS — Z0000 Encounter for general adult medical examination without abnormal findings: Secondary | ICD-10-CM

## 2020-07-28 LAB — COMPREHENSIVE METABOLIC PANEL
ALT: 14 U/L (ref 0–53)
AST: 14 U/L (ref 0–37)
Albumin: 4.4 g/dL (ref 3.5–5.2)
Alkaline Phosphatase: 47 U/L (ref 39–117)
BUN: 33 mg/dL — ABNORMAL HIGH (ref 6–23)
CO2: 29 mEq/L (ref 19–32)
Calcium: 9.5 mg/dL (ref 8.4–10.5)
Chloride: 103 mEq/L (ref 96–112)
Creatinine, Ser: 1.68 mg/dL — ABNORMAL HIGH (ref 0.40–1.50)
GFR: 41.4 mL/min — ABNORMAL LOW (ref >60.00)
Glucose, Bld: 98 mg/dL (ref 70–99)
Potassium: 4.7 mEq/L (ref 3.5–5.1)
Sodium: 139 mEq/L (ref 135–145)
Total Bilirubin: 0.9 mg/dL (ref 0.2–1.2)
Total Protein: 6.9 g/dL (ref 6.0–8.3)

## 2020-07-28 LAB — TSH: TSH: 2.16 u[IU]/mL (ref 0.35–4.50)

## 2020-07-28 LAB — HEMOGLOBIN A1C: Hgb A1c MFr Bld: 5.6 % (ref 4.6–6.5)

## 2020-07-28 MED ORDER — TADALAFIL 20 MG PO TABS
20.0000 mg | ORAL_TABLET | ORAL | 1 refills | Status: DC | PRN
Start: 2020-07-28 — End: 2020-07-28

## 2020-07-28 MED FILL — TADALAFIL 20 MG TABS: 20 | 30 days supply | Qty: 6 | Fill #0

## 2020-07-28 MED FILL — MODERNA COVID-19 VACCINE 10: 100 | 1 days supply | Qty: 0 | Fill #0

## 2020-07-28 NOTE — Progress Notes (Signed)
Subjective:    Patient ID: Jimmy [name], male    DOB: 1950/11/24, 69 y.o.   MRN: [id number]  DOS:  07/28/2020 Type of visit - description: here for CPX Since the last office visit is doing well has no major concerns except some aches and pains mostly at the knees. No edema, no myalgias per se.  Wt Readings from Last 3 Encounters:  07/28/20 250 lb 9.6 oz (113.7 kg)  01/18/20 247 lb 4 oz (112.2 kg)  07/20/19 255 lb 4 oz (115.8 kg)     Review of Systems  Other than above, a 14 point review of systems is negative    Past Medical History:  Diagnosis Date   BCC (basal cell carcinoma of skin) 2013   dr Allyson Sabal    CKD (chronic kidney disease), stage III ([place])    Diabetes mellitus    Hypertension    Obesity     Past Surgical History:  Procedure Laterality Date   pilonidal cyst surgery     POLYPECTOMY      Social History   Socioeconomic History   Marital status: Married    Spouse name: Not on file   Number of children: 4   Years of education: Not on file   Highest education level: Not on file  Occupational History   Occupation: Building control surveyor, teacher @ [employer]   Tobacco Use   Smoking status: Never Smoker   Smokeless tobacco: Never Used  Substance and Sexual Activity   Alcohol use: Yes    Alcohol/week: 0.0 standard drinks    Comment: 6 beers a year -rare    Drug use: No   Sexual activity: Not on file  Other Topics Concern   Not on file  Social History Narrative   Household- pt, wife   Grown children , 8 g-children         Social Determinants of Health   Financial Resource Strain:    Difficulty of Paying Living Expenses: Not on file  Food Insecurity:    Worried About Charity fundraiser in the Last Year: Not on file   [employer] of Food in the Last Year: Not on file  Transportation Needs:    Lack of Transportation (Medical): Not on file   Lack of Transportation (Non-Medical): Not on file  Physical Activity:    Days of Exercise per  Week: Not on file   Minutes of Exercise per Session: Not on file  Stress:    Feeling of Stress : Not on file  Social Connections:    Frequency of Communication with Friends and Family: Not on file   Frequency of Social Gatherings with Friends and Family: Not on file   Attends Religious Services: Not on file   Active Member of Clubs or Organizations: Not on file   Attends Archivist Meetings: Not on file   Marital Status: Not on file  Intimate Partner Violence:    Fear of Current or Ex-Partner: Not on file   Emotionally Abused: Not on file   Physically Abused: Not on file   Sexually Abused: Not on file      Allergies as of 07/28/2020      Reactions   Benazepril Hcl    REACTION: cough   Valsartan Other (See Comments)   Unknown reaction      Medication List       Accurate as of July 28, 2020 11:59 PM. If you have any questions, ask your nurse or doctor.  aspirin 81 MG tablet Take 81 mg by mouth daily. Reported on 01/13/2016   atorvastatin 10 MG tablet Commonly known as: LIPITOR TAKE 1/2 TABLET (5 MG TOTAL) BY MOUTH AT BEDTIME.   losartan 100 MG tablet Commonly known as: COZAAR Take 0.5 tablets (50 mg total) by mouth daily. What changed: how much to take Changed by: Kathlene November, MD   metoprolol tartrate 50 MG tablet Commonly known as: LOPRESSOR Take 1 tablet (50 mg total) by mouth 2 (two) times daily.   NovoFine 32G X 6 MM Misc Generic drug: Insulin Pen Needle Use w/ Victoza   tadalafil 20 MG tablet Commonly known as: Cialis Take 1 tablet (20 mg total) by mouth every other day as needed for erectile dysfunction. What changed: when to take this Changed by: Kathlene November, MD   Victoza 18 MG/3ML Sopn Generic drug: liraglutide Inject 0.2 mLs (1.2 mg total) into the skin daily.   Vitamin D3 25 MCG (1000 UT) Caps Take 1,000 Units by mouth daily.          Objective:   Physical Exam BP 118/82 (BP Location: Left Arm, Patient  Position: Sitting, Cuff Size: Large)    Pulse (!) 54    Temp 97.6 F (36.4 C) (Oral)    Ht 5\' 10"  (1.778 m)    Wt 250 lb 9.6 oz (113.7 kg)    SpO2 98%    BMI 35.96 kg/m  General: Well developed, NAD, BMI noted Neck: No  thyromegaly  HEENT:  Normocephalic . Face symmetric, atraumatic Lungs:  CTA B Normal respiratory effort, no intercostal retractions, no accessory muscle use. Heart: RRR,  no murmur.  Abdomen:  Not distended, soft, non-tender. No rebound or rigidity.   Lower extremities: no pretibial edema bilaterally.  + Leg bowing  noted. Skin: Exposed areas without rash. Not pale. Not jaundice Neurologic:  alert & oriented X3.  Speech normal, gait appropriate for age and unassisted Strength symmetric and appropriate for age.  Psych: Cognition and judgment appear intact.  Cooperative with normal attention span and concentration.  Behavior appropriate. No anxious or depressed appearing.     Assessment     Assessment  DM -- used to be on glimepiride, dc after he lost weight Neuropathy: DX 05-2016  HTN Hyperlipidemia  Morbid Obesity Renal insufficiency, sees nephrology, Creat ~1.9 BCC 2013 Dr. Allyson Sabal, sees derm q year DOE 2013, saw cards echo diastolic dysfx,Rx---weight loss and exercise DOE 2018: Chest x-ray and echo negative, could not afford stress test. rx observation  LE pain, 2013: CKs normal, vascular exam was normal  PLAN: Here for CPX QA:[id number] Victoza, ambulatory CBGs in the low 100s, check A1c, CMP. HTN: Ambulatory BPs excellent in the 120/70, recently nephrology decrease losartan to half tablet daily, he is also on metoprolol.  No change. Morbid obesity: Weight is stable. 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 CRI: Sees renal regularly. ED: Refill meds RTC 6 months

## 2020-07-28 NOTE — Patient Instructions (Addendum)
Continue checking your blood pressures blood sugars.   BP GOAL is between 110/65 and  135/85. If it is consistently higher or lower, let me know  Diabetes: Blood sugar goals: Fasting before a meal 70- 130 2 hours after a meal less than 180    Proceed with the Covid vaccine booster at your convenience   GO TO THE LAB : Get the blood work     [place], [place] back for for a checkup in 6 months

## 2020-07-28 NOTE — Progress Notes (Signed)
° °  Covid-19 Vaccination Clinic  Name:  LONZA SHIMABUKURO    MRN: [id number] DOB: 12/25/1950  07/28/2020  Mr. Fidalgo was observed post Covid-19 immunization for 15 minutes without incident. He was provided with Vaccine Information Sheet and instruction to access the V-Safe system.   Mr. Mogan was instructed to call 911 with any severe reactions post vaccine:  Difficulty breathing   Swelling of face and throat   A fast heartbeat   A bad rash all over body   Dizziness and weakness   Immunizations Administered    Name Date Dose VIS Date Route   Moderna COVID-19 Vaccine 07/28/2020 10:21 AM 0.5 mL 06/18/2020 Intramuscular   Manufacturer: Moderna   Lot: [id number]   [place]: [id number]

## 2020-07-29 ENCOUNTER — Encounter: Payer: Self-pay | Admitting: Internal Medicine

## 2020-07-29 NOTE — Assessment & Plan Note (Signed)
-  Td 2018 -  pneumonia shot 09/26/09, booster today - prevnar 2015 -  zostavax 2014 - s/p shingrix x 2 - covid shots x 2, rec a booster -   Flu shot today -CCS: Colonoscopy 01/2010, tubular adenoma; cscope again 02-2015, next 10 years per report -Prostate cancer screening:  DRE and PSA normal 2020 -Diet and exercise: Doing well. - Labs:  CMP, TSH, A1c

## 2020-07-29 NOTE — Assessment & Plan Note (Signed)
Here for CPX [id number] Victoza, ambulatory CBGs in the low 100s, check A1c, CMP. HTN: Ambulatory BPs excellent in the 120/70, recently nephrology decrease losartan to half tablet daily, he is also on metoprolol.  No change. Morbid obesity: Weight is stable. 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 CRI: Sees renal regularly. ED: Refill meds RTC 6 months

## 2020-08-11 MED FILL — VICTOZA 2-PAK 18 MG/3 ML PE: 18 | 60 days supply | Qty: 12 | Fill #5

## 2020-08-11 MED FILL — NOVOFINE 32G NEEDLES: 32G X 6 MM | 90 days supply | Qty: 100 | Fill #1

## 2020-09-12 ENCOUNTER — Other Ambulatory Visit: Payer: Self-pay | Admitting: Internal Medicine

## 2020-09-12 MED FILL — ATORVASTATIN CALCIUM 10 MG: 10 | 90 days supply | Qty: 45 | Fill #0

## 2020-10-08 ENCOUNTER — Other Ambulatory Visit: Payer: Self-pay | Admitting: Internal Medicine

## 2020-10-08 MED FILL — VICTOZA 2-PAK 18 MG/3 ML PE: 18 | 60 days supply | Qty: 12 | Fill #0

## 2020-11-12 ENCOUNTER — Telehealth: Payer: Self-pay

## 2020-11-12 NOTE — Telephone Encounter (Signed)
PA approved.   Request Reference Number: [id number]. VICTOZA INJ 18MG /3ML is approved through 11/12/2021.

## 2020-11-12 NOTE — Telephone Encounter (Signed)
PA initiated via Covermymeds; KEY:  [id number]. Awaiting determination.

## 2020-11-14 ENCOUNTER — Other Ambulatory Visit: Payer: Self-pay | Admitting: Internal Medicine

## 2020-12-06 MED FILL — Atorvastatin Calcium Tab 10 MG (Base Equivalent): ORAL | 90 days supply | Qty: 45 | Fill #0 | Status: AC

## 2020-12-06 MED FILL — Liraglutide Soln Pen-injector 18 MG/3ML (6 MG/ML): SUBCUTANEOUS | 60 days supply | Qty: 12 | Fill #0 | Status: AC

## 2020-12-08 ENCOUNTER — Other Ambulatory Visit (HOSPITAL_BASED_OUTPATIENT_CLINIC_OR_DEPARTMENT_OTHER): Payer: Self-pay

## 2021-01-05 ENCOUNTER — Other Ambulatory Visit: Payer: Self-pay | Admitting: Internal Medicine

## 2021-01-05 ENCOUNTER — Other Ambulatory Visit (HOSPITAL_BASED_OUTPATIENT_CLINIC_OR_DEPARTMENT_OTHER): Payer: Self-pay

## 2021-01-05 MED ORDER — LOSARTAN POTASSIUM 100 MG PO TABS
100.0000 mg | ORAL_TABLET | Freq: Every day | ORAL | 0 refills | Status: DC
  Filled 2021-01-05: qty 90, 90d supply, fill #0

## 2021-01-05 MED ORDER — METOPROLOL TARTRATE 50 MG PO TABS
50.0000 mg | ORAL_TABLET | Freq: Two times a day (BID) | ORAL | 1 refills | Status: DC
  Filled 2021-01-05: qty 180, 90d supply, fill #0

## 2021-01-23 ENCOUNTER — Encounter: Payer: Self-pay | Admitting: Internal Medicine

## 2021-01-23 ENCOUNTER — Ambulatory Visit: Payer: PRIVATE HEALTH INSURANCE | Admitting: Internal Medicine

## 2021-01-23 ENCOUNTER — Other Ambulatory Visit: Payer: Self-pay

## 2021-01-23 VITALS — BP 135/80 | HR 65 | Temp 97.5°F | Ht 70.0 in | Wt 257.0 lb

## 2021-01-23 DIAGNOSIS — E118 Type 2 diabetes mellitus with unspecified complications: Secondary | ICD-10-CM | POA: Diagnosis not present

## 2021-01-23 DIAGNOSIS — I10 Essential (primary) hypertension: Secondary | ICD-10-CM | POA: Diagnosis not present

## 2021-01-23 DIAGNOSIS — E785 Hyperlipidemia, unspecified: Secondary | ICD-10-CM | POA: Diagnosis not present

## 2021-01-23 LAB — COMPREHENSIVE METABOLIC PANEL
ALT: 14 U/L (ref 0–53)
AST: 14 U/L (ref 0–37)
Albumin: 4.3 g/dL (ref 3.5–5.2)
Alkaline Phosphatase: 61 U/L (ref 39–117)
BUN: 29 mg/dL — ABNORMAL HIGH (ref 6–23)
CO2: 25 mEq/L (ref 19–32)
Calcium: 9.3 mg/dL (ref 8.4–10.5)
Chloride: 103 mEq/L (ref 96–112)
Creatinine, Ser: 1.53 mg/dL — ABNORMAL HIGH (ref 0.40–1.50)
GFR: 46.16 mL/min — ABNORMAL LOW (ref >60.00)
Glucose, Bld: 115 mg/dL — ABNORMAL HIGH (ref 70–99)
Potassium: 4.7 mEq/L (ref 3.5–5.1)
Sodium: 137 mEq/L (ref 135–145)
Total Bilirubin: 0.7 mg/dL (ref 0.2–1.2)
Total Protein: 6.8 g/dL (ref 6.0–8.3)

## 2021-01-23 LAB — LIPID PANEL
Cholesterol: 109 mg/dL (ref 0–200)
HDL: 43.7 mg/dL (ref >39.00)
LDL Cholesterol: 53 mg/dL (ref 0–99)
NonHDL: 65.53
Total CHOL/HDL Ratio: 2
Triglycerides: 63 mg/dL (ref 0.0–149.0)
VLDL: 12.6 mg/dL (ref 0.0–40.0)

## 2021-01-23 LAB — HEMOGLOBIN A1C: Hgb A1c MFr Bld: 6 % (ref 4.6–6.5)

## 2021-01-23 NOTE — Patient Instructions (Signed)
Check the  blood pressure  BP GOAL is between 110/65 and  135/85. If it is consistently higher or lower, let me know       GO TO THE LAB : Get the blood work     [place], [place] back for   a physical exam by November 2022

## 2021-01-23 NOTE — Progress Notes (Signed)
Subjective:    Patient ID: Jimmy [name], male    DOB: Apr 29, 1951, 70 y.o.   MRN: [id number]  DOS:  01/23/2021 Type of visit - description: Routine checkup Since the last office visit he is feeling well. Has no new concerns  Wt Readings from Last 3 Encounters:  01/23/21 257 lb (116.6 kg)  07/28/20 250 lb 9.6 oz (113.7 kg)  01/18/20 247 lb 4 oz (112.2 kg)     Review of Systems I noted weight gain, he has been very busy in the last few months, unable to have good control of her diet. Continue with knee pain which is limiting his ability to move.  Has not taken any medication for pain.  Past Medical History:  Diagnosis Date  . BCC (basal cell carcinoma of skin) 2013   dr Allyson Sabal   . CKD (chronic kidney disease), stage III ([place])   . Diabetes mellitus   . Hypertension   . Obesity     Past Surgical History:  Procedure Laterality Date  . pilonidal cyst surgery    . POLYPECTOMY      Allergies as of 01/23/2021      Reactions   Benazepril Hcl    REACTION: cough   Valsartan Other (See Comments)   Unknown reaction      Medication List       Accurate as of Jan 23, 2021 11:59 PM. If you have any questions, ask your nurse or doctor.        aspirin 81 MG tablet Take 81 mg by mouth daily. Reported on 01/13/2016   atorvastatin 10 MG tablet Commonly known as: LIPITOR TAKE 1/2 TABLET BY MOUTH EVERY NIGHT AT BEDTIME   losartan 100 MG tablet Commonly known as: COZAAR Take 0.5 tablets (50 mg total) by mouth daily. What changed: how much to take Changed by: Kathlene November, MD   metoprolol tartrate 50 MG tablet Commonly known as: LOPRESSOR Take 0.5 tablets (25 mg total) by mouth 2 (two) times daily. What changed: how much to take Changed by: Kathlene November, MD   Moderna COVID-19 Vaccine 100 MCG/0.5ML injection Generic drug: COVID-19 mRNA vaccine (Moderna) INJECT AS DIRECTED   Novofine Pen Needle 32G X 6 MM Misc Generic drug: Insulin Pen Needle USE AS DIRECTED TO INJECT VICTOZA    tadalafil 20 MG tablet Commonly known as: CIALIS TAKE 1 TABLET (20 MG TOTAL) BY MOUTH EVERY OTHER DAY AS NEEDED FOR ERECTILE DYSFUNCTION.   Victoza 18 MG/3ML Sopn Generic drug: liraglutide INJECT 1.2 MG INTO THE SKIN DAILY.   Vitamin D3 25 MCG (1000 UT) Caps Take 1,000 Units by mouth daily.          Objective:   Physical Exam BP 135/80 (BP Location: Left Arm, Patient Position: Sitting, Cuff Size: Large)   Pulse 65   Temp (!) 97.5 F (36.4 C) (Temporal)   Ht 5\' 10"  (1.778 m)   Wt 257 lb (116.6 kg)   SpO2 98%   BMI 36.88 kg/m  General:   Well developed, NAD, BMI noted. HEENT:  Normocephalic . Face symmetric, atraumatic Lungs:  CTA B Normal respiratory effort, no intercostal retractions, no accessory muscle use. Heart: RRR,  no murmur.  Lower extremities: Bowing noted. DM foot exam: No edema, pinprick examination normal, good pedal pulse Skin: Not pale. Not jaundice Neurologic:  alert & oriented X3.  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 Psych--  Cognition and judgment appear intact.  Cooperative with normal attention span and concentration.  Behavior appropriate. No anxious or depressed appearing.      Assessment     Assessment  DM : was on glimeperide, it was d/c after wt loss; meds restarted ~ 2018 neuropathy: DX 05-2016  HTN Hyperlipidemia  Morbid Obesity CKD , sees nephrology, Creat ~1.9 BCC 2013 Dr. Allyson Sabal, sees derm q year DOE 2013, saw cards echo diastolic dysfx,Rx---weight loss and exercise DOE 2018: Chest x-ray and echo negative, could not afford stress test. rx observation  LE pain, 2013: CKs normal, vascular exam was normal  PLAN: DM: Last A1c excellent, and noted weight gain, ambulatory CBGs remain okay in the low 100s.  Only on Victoza.  Check A1c HTN: Ambulatory BPs excellence in the 120s.  Currently on losartan , metoprolol (reduced dose by nephrology), check a CMP High Cholesterol: On Lipitor, check FLP Preventive  care: Rec COVID-vaccine #4: Benefits discussed, he is reluctant. RTC 6 months CPX   This visit occurred during the SARS-CoV-2 public health emergency.  Safety protocols were in place, including screening questions prior to the visit, additional usage of staff PPE, and extensive cleaning of exam room while observing appropriate contact time as indicated for disinfecting solutions.

## 2021-01-24 NOTE — Assessment & Plan Note (Signed)
DM: Last A1c excellent, and noted weight gain, ambulatory CBGs remain okay in the low 100s.  Only on Victoza.  Check A1c HTN: Ambulatory BPs excellence in the 120s.  Currently on losartan , metoprolol (reduced dose by nephrology), check a CMP High Cholesterol: On Lipitor, check FLP Preventive care: Rec COVID-vaccine #4: Benefits discussed, he is reluctant. RTC 6 months CPX

## 2021-02-05 ENCOUNTER — Other Ambulatory Visit (HOSPITAL_BASED_OUTPATIENT_CLINIC_OR_DEPARTMENT_OTHER): Payer: Self-pay

## 2021-02-05 MED FILL — Tadalafil Tab 20 MG: ORAL | 30 days supply | Qty: 4 | Fill #0 | Status: AC

## 2021-02-05 MED FILL — Liraglutide Soln Pen-injector 18 MG/3ML (6 MG/ML): SUBCUTANEOUS | 60 days supply | Qty: 12 | Fill #1 | Status: AC

## 2021-02-20 ENCOUNTER — Other Ambulatory Visit (HOSPITAL_BASED_OUTPATIENT_CLINIC_OR_DEPARTMENT_OTHER): Payer: Self-pay

## 2021-02-20 MED FILL — Insulin Pen Needle 32 G X 6 MM (1/4" or 15/64"): 90 days supply | Qty: 100 | Fill #0 | Status: AC

## 2021-03-06 ENCOUNTER — Other Ambulatory Visit: Payer: Self-pay | Admitting: Internal Medicine

## 2021-03-06 ENCOUNTER — Other Ambulatory Visit (HOSPITAL_BASED_OUTPATIENT_CLINIC_OR_DEPARTMENT_OTHER): Payer: Self-pay

## 2021-03-06 MED ORDER — ATORVASTATIN CALCIUM 10 MG PO TABS
5.0000 mg | ORAL_TABLET | Freq: Every day | ORAL | 1 refills | Status: DC
  Filled 2021-03-06: qty 45, 90d supply, fill #0
  Filled 2021-06-08: qty 45, 90d supply, fill #1

## 2021-04-09 ENCOUNTER — Other Ambulatory Visit (HOSPITAL_BASED_OUTPATIENT_CLINIC_OR_DEPARTMENT_OTHER): Payer: Self-pay

## 2021-04-09 MED FILL — Liraglutide Soln Pen-injector 18 MG/3ML (6 MG/ML): SUBCUTANEOUS | 60 days supply | Qty: 12 | Fill #2 | Status: AC

## 2021-05-25 ENCOUNTER — Encounter: Payer: Self-pay | Admitting: Internal Medicine

## 2021-05-25 LAB — IRON,TIBC AND FERRITIN PANEL
%SAT: 26
Ferritin: 473
Iron: 73
TIBC: 286
UIBC: 213

## 2021-05-25 LAB — BASIC METABOLIC PANEL
BUN: 32 — AB (ref 4–21)
CO2: 26 — AB (ref 13–22)
Chloride: 102 (ref 99–108)
Creatinine: 1.7 — AB (ref 0.6–1.3)
Glucose: 119
Potassium: 4.6 (ref 3.4–5.3)
Sodium: 138 (ref 137–147)

## 2021-05-25 LAB — CBC AND DIFFERENTIAL: Hemoglobin: 15.3 (ref 13.5–17.5)

## 2021-05-25 LAB — COMPREHENSIVE METABOLIC PANEL
Albumin: 4.5 (ref 3.5–5.0)
Calcium: 9.2 (ref 8.7–10.7)
GFR calc non Af Amer: 45

## 2021-06-08 ENCOUNTER — Other Ambulatory Visit (HOSPITAL_BASED_OUTPATIENT_CLINIC_OR_DEPARTMENT_OTHER): Payer: Self-pay

## 2021-06-08 ENCOUNTER — Other Ambulatory Visit: Payer: Self-pay | Admitting: Internal Medicine

## 2021-06-08 MED ORDER — TECHLITE PEN NEEDLES 32G X 6 MM MISC
3 refills | Status: DC
  Filled 2021-06-08: qty 100, 90d supply, fill #0
  Filled 2021-09-27: qty 100, 90d supply, fill #1
  Filled 2021-12-26: qty 100, 90d supply, fill #2

## 2021-06-08 MED FILL — Liraglutide Soln Pen-injector 18 MG/3ML (6 MG/ML): SUBCUTANEOUS | 60 days supply | Qty: 12 | Fill #3 | Status: AC

## 2021-06-08 MED FILL — Tadalafil Tab 20 MG: ORAL | 30 days supply | Qty: 4 | Fill #1 | Status: AC

## 2021-06-10 ENCOUNTER — Encounter: Payer: Self-pay | Admitting: Internal Medicine

## 2021-06-10 LAB — HM DIABETES EYE EXAM

## 2021-07-01 ENCOUNTER — Other Ambulatory Visit: Payer: Self-pay | Admitting: Internal Medicine

## 2021-07-01 ENCOUNTER — Other Ambulatory Visit (HOSPITAL_BASED_OUTPATIENT_CLINIC_OR_DEPARTMENT_OTHER): Payer: Self-pay

## 2021-07-01 MED ORDER — METOPROLOL TARTRATE 50 MG PO TABS
50.0000 mg | ORAL_TABLET | Freq: Two times a day (BID) | ORAL | 1 refills | Status: DC
  Filled 2021-07-01: qty 180, 90d supply, fill #0
  Filled 2021-12-24: qty 180, 90d supply, fill #1

## 2021-07-01 MED ORDER — LOSARTAN POTASSIUM 100 MG PO TABS
100.0000 mg | ORAL_TABLET | Freq: Every day | ORAL | 0 refills | Status: DC
  Filled 2021-07-01: qty 90, 90d supply, fill #0

## 2021-07-30 ENCOUNTER — Other Ambulatory Visit: Payer: Self-pay

## 2021-07-31 ENCOUNTER — Ambulatory Visit (INDEPENDENT_AMBULATORY_CARE_PROVIDER_SITE_OTHER): Payer: No Typology Code available for payment source | Admitting: Internal Medicine

## 2021-07-31 ENCOUNTER — Encounter: Payer: Self-pay | Admitting: Internal Medicine

## 2021-07-31 VITALS — BP 122/82 | HR 63 | Temp 98.0°F | Resp 18 | Ht 70.0 in | Wt 258.0 lb

## 2021-07-31 DIAGNOSIS — E785 Hyperlipidemia, unspecified: Secondary | ICD-10-CM | POA: Diagnosis not present

## 2021-07-31 DIAGNOSIS — Z0000 Encounter for general adult medical examination without abnormal findings: Secondary | ICD-10-CM

## 2021-07-31 DIAGNOSIS — E118 Type 2 diabetes mellitus with unspecified complications: Secondary | ICD-10-CM

## 2021-07-31 DIAGNOSIS — I10 Essential (primary) hypertension: Secondary | ICD-10-CM | POA: Diagnosis not present

## 2021-07-31 LAB — PSA: PSA: 1.76 ng/mL (ref 0.10–4.00)

## 2021-07-31 LAB — TSH: TSH: 1.58 u[IU]/mL (ref 0.35–5.50)

## 2021-07-31 LAB — HEMOGLOBIN A1C: Hgb A1c MFr Bld: 6.1 % (ref 4.6–6.5)

## 2021-07-31 MED ORDER — LOSARTAN POTASSIUM 100 MG PO TABS: 50.0000 mg | ORAL_TABLET | Freq: Every day | ORAL | Status: DC

## 2021-07-31 NOTE — Patient Instructions (Signed)
Continue checking your blood pressures BP GOAL is between 110/65 and  135/85. If it is consistently higher or lower, let me know  Consider NOOM    GO TO THE LAB : Get the blood work     [name], Jimmy back for a checkup in 6 months

## 2021-07-31 NOTE — Assessment & Plan Note (Signed)
-  Td 2018 -  pnm23: 2011,2021;  prevnar 2015 -  zostavax 2014;  s/p shingrix x 2 - covid shots  booster rec -   had a Flu shot   -CCS: Colonoscopy 01/2010, tubular adenoma; cscope again 02-2015, next 10 years per report -Prostate cancer screening: No symptoms, DRE normal, check a PSA. -Diet and exercise:  d/w pt  - Labs:  A1c TSH PSA -Advance care planning package provided

## 2021-07-31 NOTE — Assessment & Plan Note (Signed)
Here for CPX DM: Good compliance with Victoza, ambulatory CBGs in the 120s, check A1c HTN: Currently on metoprolol.  Losartan dose decreased to 50 mg recently by nephrology.  Ambulatory BPs remain excellent in the 120s. CKD: Saw nephrology 05/22/2021, creatinine was 1.6, he was felt to be stable, most recent GFR 46.  They were considering [place].  They decrease dose of losartan. Morbid obesity: Not making progress, rec to consider NOOM    RTC 6 months

## 2021-07-31 NOTE — Progress Notes (Signed)
Subjective:    Patient ID: Jimmy [name], male    DOB: 06/01/1951, 70 y.o.   MRN: [id number]  DOS:  07/31/2021 Type of visit - description: CPX  CPX, since the last visit he is doing well and has no major concerns. Saw nephrology, note reviewed.  Wt Readings from Last 3 Encounters:  07/31/21 258 lb (117 kg)  01/23/21 257 lb (116.6 kg)  07/28/20 250 lb 9.6 oz (113.7 kg)     Review of Systems  Other than above, a 14 point review of systems is negative    Past Medical History:  Diagnosis Date   BCC (basal cell carcinoma of skin) 2013   dr Allyson Sabal    CKD (chronic kidney disease), stage III ([place])    Diabetes mellitus    Hypertension    Obesity     Past Surgical History:  Procedure Laterality Date   pilonidal cyst surgery     POLYPECTOMY     Social History   Socioeconomic History   Marital status: Married    Spouse name: Not on file   Number of children: 4   Years of education: Not on file   Highest education level: Not on file  Occupational History   Occupation: Building control surveyor, teacher @ [employer]   Tobacco Use   Smoking status: Never   Smokeless tobacco: Never  Substance and Sexual Activity   Alcohol use: Yes    Alcohol/week: 0.0 standard drinks    Comment: 6 beers a year -rare    Drug use: No   Sexual activity: Not on file  Other Topics Concern   Not on file  Social History Narrative   Household- pt, wife   Grown children , 8 g-children         Social Determinants of Health   Financial Resource Strain: Not on file  Food Insecurity: Not on file  Transportation Needs: Not on file  Physical Activity: Not on file  Stress: Not on file  Social Connections: Not on file  Intimate Partner Violence: Not on file     Allergies as of 07/31/2021       Reactions   Benazepril Hcl    REACTION: cough   Valsartan Other (See Comments)   Unknown reaction        Medication List        Accurate as of July 31, 2021  5:11 PM. If you have any questions,  ask your nurse or doctor.          aspirin 81 MG tablet Take 81 mg by mouth daily. Reported on 01/13/2016   atorvastatin 10 MG tablet Commonly known as: LIPITOR Take 1/2 tablet (5 mg total) by mouth at bedtime.   losartan 100 MG tablet Commonly known as: COZAAR Take 0.5 tablets (50 mg total) by mouth daily.   metoprolol tartrate 50 MG tablet Commonly known as: LOPRESSOR Take 1 tablet (50 mg total) by mouth 2 (two) times daily. What changed: how much to take   tadalafil 20 MG tablet Commonly known as: CIALIS TAKE 1 TABLET (20 MG TOTAL) BY MOUTH EVERY OTHER DAY AS NEEDED FOR ERECTILE DYSFUNCTION.   TechLite Pen Needles 32G X 6 MM Misc Generic drug: Insulin Pen Needle USE AS DIRECTED TO INJECT VICTOZA   Victoza 18 MG/3ML Sopn Generic drug: liraglutide INJECT 1.2 MG INTO THE SKIN DAILY.   vitamin C 1000 MG tablet Take 1,000 mg by mouth in the morning and at bedtime.   Vitamin D3 25 MCG (  1000 UT) Caps Take 1,000 Units by mouth daily.           Objective:   Physical Exam BP 122/82 (BP Location: Left Arm, Patient Position: Sitting, Cuff Size: Normal)   Pulse 63   Temp 98 F (36.7 C) (Oral)   Resp 18   Ht 5\' 10"  (1.778 m)   Wt 258 lb (117 kg)   SpO2 98%   BMI 37.02 kg/m  General: Well developed, NAD, BMI noted Neck: No  thyromegaly  HEENT:  Normocephalic . Face symmetric, atraumatic Lungs:  CTA B Normal respiratory effort, no intercostal retractions, no accessory muscle use. Heart: RRR,  no murmur.  Abdomen:  Not distended, soft, non-tender. No rebound or rigidity.   Lower extremities: no pretibial edema bilaterally DRE: Normal sphincter tone, brown stools, prostate normal Skin: Exposed areas without rash. Not pale. Not jaundice Neurologic:  alert & oriented X3.  Speech normal, gait appropriate for age and unassisted Strength symmetric and appropriate for age.  Psych: Cognition and judgment appear intact.  Cooperative with normal attention span and  concentration.  Behavior appropriate. No anxious or depressed appearing.     Assessment    Assessment  DM : was on glimeperide, it was d/c after wt loss; meds restarted ~ 2018 neuropathy: DX 05-2016  HTN Hyperlipidemia  Morbid Obesity CKD , sees nephrology, Creat ~1.9 [place] 2013 , no regular checks as off 07-2021  DOE 2013, saw cards echo diastolic dysfx,Rx---weight loss and exercise DOE 2018: Chest x-ray and echo negative, could not afford stress test. rx observation     PLAN: Here for CPX DM: Good compliance with Victoza, ambulatory CBGs in the 120s, check A1c HTN: Currently on metoprolol.  Losartan dose decreased to 50 mg recently by nephrology.  Ambulatory BPs remain excellent in the 120s. CKD: Saw nephrology 05/22/2021, creatinine was 1.6, he was felt to be stable, most recent GFR 46.  They were considering [place].  They decrease dose of losartan. Morbid obesity: Not making progress, rec to consider NOOM    RTC 6 months    This visit occurred during the SARS-CoV-2 public health emergency.  Safety protocols were in place, including screening questions prior to the visit, additional usage of staff PPE, and extensive cleaning of exam room while observing appropriate contact time as indicated for disinfecting solutions.

## 2021-08-07 ENCOUNTER — Other Ambulatory Visit (HOSPITAL_BASED_OUTPATIENT_CLINIC_OR_DEPARTMENT_OTHER): Payer: Self-pay

## 2021-08-07 MED FILL — Liraglutide Soln Pen-injector 18 MG/3ML (6 MG/ML): SUBCUTANEOUS | 60 days supply | Qty: 12 | Fill #4 | Status: AC

## 2021-08-10 ENCOUNTER — Other Ambulatory Visit (HOSPITAL_BASED_OUTPATIENT_CLINIC_OR_DEPARTMENT_OTHER): Payer: Self-pay

## 2021-09-01 ENCOUNTER — Other Ambulatory Visit: Payer: Self-pay | Admitting: Internal Medicine

## 2021-09-01 ENCOUNTER — Other Ambulatory Visit (HOSPITAL_BASED_OUTPATIENT_CLINIC_OR_DEPARTMENT_OTHER): Payer: Self-pay

## 2021-09-01 MED ORDER — ATORVASTATIN CALCIUM 10 MG PO TABS
5.0000 mg | ORAL_TABLET | Freq: Every day | ORAL | 1 refills | Status: DC
  Filled 2021-09-01: qty 45, 90d supply, fill #0
  Filled 2021-11-30: qty 45, 90d supply, fill #1

## 2021-09-28 ENCOUNTER — Other Ambulatory Visit (HOSPITAL_BASED_OUTPATIENT_CLINIC_OR_DEPARTMENT_OTHER): Payer: Self-pay

## 2021-10-06 ENCOUNTER — Other Ambulatory Visit (HOSPITAL_BASED_OUTPATIENT_CLINIC_OR_DEPARTMENT_OTHER): Payer: Self-pay

## 2021-10-06 ENCOUNTER — Other Ambulatory Visit: Payer: Self-pay | Admitting: Internal Medicine

## 2021-10-06 MED ORDER — VICTOZA 18 MG/3ML ~~LOC~~ SOPN
PEN_INJECTOR | SUBCUTANEOUS | 0 refills | Status: DC
  Filled 2021-10-06: qty 12, 60d supply, fill #0

## 2021-11-30 ENCOUNTER — Other Ambulatory Visit (HOSPITAL_BASED_OUTPATIENT_CLINIC_OR_DEPARTMENT_OTHER): Payer: Self-pay

## 2021-12-07 ENCOUNTER — Other Ambulatory Visit (HOSPITAL_BASED_OUTPATIENT_CLINIC_OR_DEPARTMENT_OTHER): Payer: Self-pay

## 2021-12-07 ENCOUNTER — Other Ambulatory Visit: Payer: Self-pay | Admitting: Internal Medicine

## 2021-12-07 ENCOUNTER — Telehealth: Payer: Self-pay

## 2021-12-07 MED ORDER — VICTOZA 18 MG/3ML ~~LOC~~ SOPN
PEN_INJECTOR | SUBCUTANEOUS | 0 refills | Status: DC
  Filled 2021-12-07: qty 12, 60d supply, fill #0

## 2021-12-07 NOTE — Telephone Encounter (Signed)
PA initiated via Covermymeds; KEY; [id number]. Awaiting determination.  ?

## 2021-12-07 NOTE — Telephone Encounter (Signed)
PA approved. ? ?Request Reference Number: [id number]. VICTOZA INJ '18MG'$ /3ML is approved through 12/08/2022. Your patient may now fill this prescription and it will be covered ?

## 2021-12-24 ENCOUNTER — Other Ambulatory Visit: Payer: Self-pay | Admitting: Internal Medicine

## 2021-12-24 ENCOUNTER — Other Ambulatory Visit (HOSPITAL_BASED_OUTPATIENT_CLINIC_OR_DEPARTMENT_OTHER): Payer: Self-pay

## 2021-12-24 MED ORDER — TADALAFIL 20 MG PO TABS
ORAL_TABLET | ORAL | 1 refills | Status: DC
  Filled 2021-12-24: qty 10, 50d supply, fill #0

## 2021-12-24 MED ORDER — LOSARTAN POTASSIUM 100 MG PO TABS
50.0000 mg | ORAL_TABLET | Freq: Every day | ORAL | 1 refills | Status: DC
  Filled 2021-12-24: qty 45, 90d supply, fill #0

## 2021-12-28 ENCOUNTER — Other Ambulatory Visit (HOSPITAL_BASED_OUTPATIENT_CLINIC_OR_DEPARTMENT_OTHER): Payer: Self-pay

## 2022-02-01 ENCOUNTER — Other Ambulatory Visit (HOSPITAL_BASED_OUTPATIENT_CLINIC_OR_DEPARTMENT_OTHER): Payer: Self-pay

## 2022-02-01 ENCOUNTER — Encounter: Payer: Self-pay | Admitting: Internal Medicine

## 2022-02-01 ENCOUNTER — Ambulatory Visit (INDEPENDENT_AMBULATORY_CARE_PROVIDER_SITE_OTHER): Payer: Medicare Other | Admitting: Internal Medicine

## 2022-02-01 VITALS — BP 126/72 | HR 57 | Temp 97.7°F | Resp 18 | Ht 70.0 in | Wt 264.5 lb

## 2022-02-01 DIAGNOSIS — E785 Hyperlipidemia, unspecified: Secondary | ICD-10-CM

## 2022-02-01 DIAGNOSIS — E118 Type 2 diabetes mellitus with unspecified complications: Secondary | ICD-10-CM

## 2022-02-01 DIAGNOSIS — I10 Essential (primary) hypertension: Secondary | ICD-10-CM | POA: Diagnosis not present

## 2022-02-01 DIAGNOSIS — N183 Chronic kidney disease, stage 3 unspecified: Secondary | ICD-10-CM

## 2022-02-01 DIAGNOSIS — C44310 Basal cell carcinoma of skin of unspecified parts of face: Secondary | ICD-10-CM

## 2022-02-01 LAB — COMPREHENSIVE METABOLIC PANEL
ALT: 18 U/L (ref 0–53)
AST: 17 U/L (ref 0–37)
Albumin: 4.3 g/dL (ref 3.5–5.2)
Alkaline Phosphatase: 54 U/L (ref 39–117)
BUN: 35 mg/dL — ABNORMAL HIGH (ref 6–23)
CO2: 27 mEq/L (ref 19–32)
Calcium: 9.4 mg/dL (ref 8.4–10.5)
Chloride: 104 mEq/L (ref 96–112)
Creatinine, Ser: 1.79 mg/dL — ABNORMAL HIGH (ref 0.40–1.50)
GFR: 37.96 mL/min — ABNORMAL LOW (ref >60.00)
Glucose, Bld: 114 mg/dL — ABNORMAL HIGH (ref 70–99)
Potassium: 4.5 mEq/L (ref 3.5–5.1)
Sodium: 139 mEq/L (ref 135–145)
Total Bilirubin: 1 mg/dL (ref 0.2–1.2)
Total Protein: 6.8 g/dL (ref 6.0–8.3)

## 2022-02-01 LAB — LIPID PANEL
Cholesterol: 108 mg/dL (ref 0–200)
HDL: 46.4 mg/dL (ref >39.00)
LDL Cholesterol: 47 mg/dL (ref 0–99)
NonHDL: 61.71
Total CHOL/HDL Ratio: 2
Triglycerides: 74 mg/dL (ref 0.0–149.0)
VLDL: 14.8 mg/dL (ref 0.0–40.0)

## 2022-02-01 LAB — HEMOGLOBIN A1C: Hgb A1c MFr Bld: 6 % (ref 4.6–6.5)

## 2022-02-01 MED ORDER — METOPROLOL TARTRATE 50 MG PO TABS
25.0000 mg | ORAL_TABLET | Freq: Two times a day (BID) | ORAL | Status: DC
Start: 2022-02-01 — End: 2022-02-01

## 2022-02-01 MED ORDER — METOPROLOL TARTRATE 50 MG PO TABS: 25.0000 mg | ORAL_TABLET | Freq: Two times a day (BID) | ORAL | 1 refills | Status: DC

## 2022-02-01 NOTE — Patient Instructions (Addendum)
Recommend to proceed with covid booster (bivalent) at your pharmacy.  Check the  blood pressure regularly BP GOAL is between 110/65 and  135/85. If it is consistently higher or lower, let me know  Diabetes: Check your blood sugar at different times  - early in AM fasting  ( goal 70-130) - 2 hours after a meal (goal less than 180)    GO TO THE LAB : Get the blood work     [place], [place] back for   a physical exam by December

## 2022-02-01 NOTE — Addendum Note (Signed)
Addended byDamita Dunnings D on: 02/01/2022 04:58 PM   Modules accepted: Orders

## 2022-02-01 NOTE — Progress Notes (Signed)
Subjective:    Patient ID: Jimmy [name], male    DOB: 1951/05/10, 71 y.o.   MRN: [id number]  DOS:  02/01/2022 Type of visit - description: Follow-up  Today we talk about hypertension, diabetes, high cholesterol. Good compliance with medication. I reviewed his ambulatory BPs and CBGs.  Also, has a scab on the right temple that won't  heal.  Wt Readings from Last 3 Encounters:  02/01/22 264 lb 8 oz (120 kg)  07/31/21 258 lb (117 kg)  01/23/21 257 lb (116.6 kg)    Review of Systems Denies lower extremity paresthesias No nausea vomiting diarrhea.  No blood in the stool  Past Medical History:  Diagnosis Date   BCC (basal cell carcinoma of skin) 2013   dr Allyson Sabal    CKD (chronic kidney disease), stage III ([place])    Diabetes mellitus    Hypertension    Obesity     Past Surgical History:  Procedure Laterality Date   pilonidal cyst surgery     POLYPECTOMY      Current Outpatient Medications  Medication Instructions   aspirin 81 mg, Oral, Daily, Reported on 01/13/2016   atorvastatin (LIPITOR) 10 MG tablet Take 1/2 tablet (5 mg total) by mouth at bedtime.   Insulin Pen Needle (TECHLITE PEN NEEDLES) 32G X 6 MM MISC USE AS DIRECTED TO INJECT VICTOZA   liraglutide (VICTOZA) 18 MG/3ML SOPN INJECT 1.2 MG INTO THE SKIN DAILY.   losartan (COZAAR) 100 MG tablet Take 1/2 tablet (50 mg total) by mouth daily.   metoprolol tartrate (LOPRESSOR) 50 mg, Oral, 2 times daily   tadalafil (CIALIS) 20 MG tablet TAKE 1 TABLET (20 MG TOTAL) BY MOUTH EVERY OTHER DAY AS NEEDED FOR ERECTILE DYSFUNCTION.   vitamin C 1,000 mg, Oral, 2 times daily   Vitamin D3 1,000 Units, Oral, Daily,         Objective:   Physical Exam BP 126/72   Pulse (!) 57   Temp 97.7 F (36.5 C) (Oral)   Resp 18   Ht '5\' 10"'$  (1.778 m)   Wt 264 lb 8 oz (120 kg)   SpO2 96%   BMI 37.95 kg/m  General:   Well developed, NAD, BMI noted. HEENT:  Normocephalic . Face symmetric, atraumatic Lungs:  CTA B Normal respiratory  effort, no intercostal retractions, no accessory muscle use. Heart: RRR,  no murmur.  DM foot exam: No edema, good pulses, pinprick examination normal Skin: Has a 3 mm scab/shallow ulcer at the right temple,  neurologic:  alert & oriented X3.  Speech normal, gait appropriate for age and unassisted Psych--  Cognition and judgment appear intact.  Cooperative with normal attention span and concentration.  Behavior appropriate. No anxious or depressed appearing.      Assessment     Assessment  DM : was on glimeperide, it was d/c after wt loss; meds restarted ~ 2018 neuropathy: DX 05-2016  HTN Hyperlipidemia  Morbid Obesity CKD , sees nephrology, Creat ~1.9 [place] 2013 , no regular checks as off 07-2021  DOE 2013, saw cards echo diastolic dysfx,Rx---weight loss and exercise DOE 2018: Chest x-ray and echo negative, could not afford stress test. rx observation     PLAN: DM: Last A1c 6.1.  Currently on Victoza.  He recently retired, has more time on his hands, encouraged to be as active as he can.  Healthy diet is recommended.  Foot exam negative.  Check A1c.  Adjust medications if needed Hypertension: On losartan, metoprolol.  Ambulatory BPs  all in the 120s / 70 or 80.  No change High cholesterol: On atorvastatin, check FLP.  We will further advised with results CKD: Last visit with nephrology in few months.  Check a CMP BCC: Skin lesion at the right temple likely [facility], used to see Dr. Allyson Sabal, like something closer to home, referred to [facility] dermatology Morbid obesity: Encourage healthy diet and stay active physically.  Physical activity somewhat limited by knee pain but he has a stationary bike Social: Retired from [employer] 12/2021, still very active intellectually. RTC 07/2022 CPE

## 2022-02-01 NOTE — Assessment & Plan Note (Signed)
DM: Last A1c 6.1.  Currently on Victoza.  He recently retired, has more time on his hands, encouraged to be as active as he can.  Healthy diet is recommended.  Foot exam negative.  Check A1c.  Adjust medications if needed Hypertension: On losartan, metoprolol.  Ambulatory BPs all in the 120s / 70 or 80.  No change High cholesterol: On atorvastatin, check FLP.  We will further advised with results CKD: Last visit with nephrology in few months.  Check a CMP BCC: Skin lesion at the right temple likely [facility], used to see Dr. Allyson Sabal, like something closer to home, referred to [facility] dermatology Morbid obesity: Encourage healthy diet and stay active physically.  Physical activity somewhat limited by knee pain but he has a stationary bike Social: Retired from [employer] 12/2021, still very active intellectually. RTC 07/2022 CPE

## 2022-02-02 ENCOUNTER — Other Ambulatory Visit: Payer: Self-pay

## 2022-02-02 MED ORDER — ATORVASTATIN CALCIUM 10 MG PO TABS: 5.0000 mg | ORAL_TABLET | Freq: Every day | ORAL | 1 refills | Status: DC

## 2022-02-02 MED ORDER — TECHLITE PEN NEEDLES 32G X 6 MM MISC: 3 refills | Status: DC

## 2022-02-02 MED ORDER — VICTOZA 18 MG/3ML ~~LOC~~ SOPN: PEN_INJECTOR | SUBCUTANEOUS | 1 refills | Status: DC

## 2022-05-28 ENCOUNTER — Other Ambulatory Visit: Payer: Self-pay | Admitting: Internal Medicine

## 2022-06-11 LAB — COMPREHENSIVE METABOLIC PANEL
Albumin: 4.6 (ref 3.5–5.0)
Calcium: 9.3 (ref 8.7–10.7)
eGFR: 45

## 2022-06-11 LAB — PROTEIN / CREATININE RATIO, URINE: Creatinine, Urine: 78.9

## 2022-06-11 LAB — IRON,TIBC AND FERRITIN PANEL
%SAT: 29
Ferritin: 521
Iron: 97
TIBC: 331
UIBC: 234

## 2022-06-11 LAB — BASIC METABOLIC PANEL
BUN: 28 — AB (ref 4–21)
CO2: 21 (ref 13–22)
Chloride: 103 (ref 99–108)
Creatinine: 1.6 — AB (ref 0.6–1.3)
Glucose: 117
Potassium: 4.6 mEq/L (ref 3.5–5.1)
Sodium: 140 (ref 137–147)

## 2022-06-11 LAB — CBC AND DIFFERENTIAL: Hemoglobin: 14.7 (ref 13.5–17.5)

## 2022-06-14 ENCOUNTER — Encounter: Payer: Self-pay | Admitting: Internal Medicine

## 2022-06-14 LAB — HM DIABETES EYE EXAM

## 2022-06-21 ENCOUNTER — Encounter: Payer: Self-pay | Admitting: Internal Medicine

## 2022-06-27 ENCOUNTER — Other Ambulatory Visit: Payer: Self-pay | Admitting: Internal Medicine

## 2022-07-06 ENCOUNTER — Ambulatory Visit (INDEPENDENT_AMBULATORY_CARE_PROVIDER_SITE_OTHER): Payer: Medicare Other | Admitting: Internal Medicine

## 2022-07-06 ENCOUNTER — Encounter: Payer: Self-pay | Admitting: Internal Medicine

## 2022-07-06 ENCOUNTER — Telehealth: Payer: Self-pay

## 2022-07-06 VITALS — BP 126/80 | HR 57 | Temp 98.2°F | Resp 18 | Ht 70.0 in | Wt 267.0 lb

## 2022-07-06 DIAGNOSIS — Z23 Encounter for immunization: Secondary | ICD-10-CM | POA: Diagnosis not present

## 2022-07-06 DIAGNOSIS — S29011A Strain of muscle and tendon of front wall of thorax, initial encounter: Secondary | ICD-10-CM | POA: Diagnosis not present

## 2022-07-06 DIAGNOSIS — T148XXA Other injury of unspecified body region, initial encounter: Secondary | ICD-10-CM

## 2022-07-06 NOTE — Telephone Encounter (Signed)
PA approved. Effective 08/30/22 to 08/30/23.  

## 2022-07-06 NOTE — Progress Notes (Unsigned)
Subjective:    Patient ID: Jimmy [name], male    DOB: [birth year], 71 y.o.   MRN: [id number]  DOS:  07/06/2022 Type of visit - description: Acute  About 4 weeks ago, he lifted a 5 gallon container with the right arm, felt a "snap" at the right side of the chest, having some discomfort/soreness there since. He points to the right pectoral area. Interestingly, he can go to the gym and using the weight machines and the pain is not worse   Wonders about this being his heart however he has been very active including swimming for almost an hour and he does not get short of breath and the pain does not seem to be worse.  Denies any shoulder pain. No bicipital pain No deformities  Review of Systems See above   Past Medical History:  Diagnosis Date   BCC (basal cell carcinoma of skin) 2013   dr Allyson Sabal    CKD (chronic kidney disease), stage III ([place])    Diabetes mellitus    Hypertension    Obesity     Past Surgical History:  Procedure Laterality Date   pilonidal cyst surgery     POLYPECTOMY      Current Outpatient Medications  Medication Instructions   aspirin 81 mg, Oral, Daily, Reported on 01/13/2016   atorvastatin (LIPITOR) 10 MG tablet Take 1/2 tablet (5 mg total) by mouth at bedtime.   Insulin Pen Needle (TECHLITE PEN NEEDLES) 32G X 6 MM MISC USE AS DIRECTED TO INJECT VICTOZA   losartan (COZAAR) 50 mg, Oral, Daily   metoprolol tartrate (LOPRESSOR) 25 mg, Oral, 2 times daily   tadalafil (CIALIS) 20 MG tablet TAKE 1 TABLET (20 MG TOTAL) BY MOUTH EVERY OTHER DAY AS NEEDED FOR ERECTILE DYSFUNCTION.   Victoza 1.2 mg, Subcutaneous, Daily   vitamin C 1,000 mg, Oral, 2 times daily   Vitamin D3 1,000 Units, Oral, Daily,         Objective:   Physical Exam Chest:      BP 126/80   Pulse (!) 57   Temp 98.2 F (36.8 C) (Oral)   Resp 18   Ht '5\' 10"'$  (1.778 m)   Wt 267 lb (121.1 kg)   SpO2 98%   BMI 38.31 kg/m  General:   Well developed, NAD, BMI noted. HEENT:   Normocephalic . Face symmetric, atraumatic Shoulders: Normal to inspection and palpation. Biceps: Symmetric. Chest wall: Symmetric, no TTP pectoral muscles. Lower extremities: no pretibial edema bilaterally  Skin: Not pale. Not jaundice Neurologic:  alert & oriented X3.  Speech normal, gait appropriate for age and unassisted Psych--  Cognition and judgment appear intact.  Cooperative with normal attention span and concentration.  Behavior appropriate. No anxious or depressed appearing.      Assessment    Assessment  DM : was on glimeperide, it was d/c after wt loss; meds restarted ~ 2018 neuropathy: DX 05-2016  HTN Hyperlipidemia  Morbid Obesity CKD , sees nephrology, Creat ~1.9 [place] 2013 , no regular checks as off 07-2021  DOE 2013, saw cards echo diastolic dysfx,Rx---weight loss and exercise DOE 2018: Chest x-ray and echo negative, could not afford stress test. rx observation     PLAN: Sprain, right pectoral muscle: Symptoms as described above, suspect a sprain of the right pectoral muscle, recommend to wait a couple of additional weeks and if he is not back to normal will need a referral. The patient is somewhat concerned about sxs being heart related however he  denies substernal chest pain, symptoms are not exertional and this started after he did some heavy lifting with the right arm. Patient aware that if the pain changes character or location he is to seek medical advice.

## 2022-07-06 NOTE — Telephone Encounter (Signed)
PA initiated via Covermymeds; KEY: [id number]. Awaiting determination.

## 2022-07-07 NOTE — Assessment & Plan Note (Signed)
Sprain, right pectoral muscle: Symptoms as described above, suspect a sprain of the right pectoral muscle, recommend to wait a couple of additional weeks and if he is not back to normal will need a referral. The patient is somewhat concerned about sxs being heart related however he denies substernal chest pain, symptoms are not exertional and this started after he did some heavy lifting with the right arm. Patient aware that if the pain changes character or location he is to seek medical advice.

## 2022-07-12 ENCOUNTER — Encounter: Payer: Self-pay | Admitting: Internal Medicine

## 2022-08-05 ENCOUNTER — Ambulatory Visit: Payer: PRIVATE HEALTH INSURANCE | Admitting: Internal Medicine

## 2022-08-17 ENCOUNTER — Encounter: Payer: Self-pay | Admitting: Internal Medicine

## 2022-08-17 ENCOUNTER — Ambulatory Visit (INDEPENDENT_AMBULATORY_CARE_PROVIDER_SITE_OTHER): Payer: Medicare Other | Admitting: Internal Medicine

## 2022-08-17 VITALS — BP 134/82 | HR 59 | Temp 98.0°F | Resp 16 | Ht 70.0 in | Wt 263.4 lb

## 2022-08-17 DIAGNOSIS — Z23 Encounter for immunization: Secondary | ICD-10-CM | POA: Diagnosis not present

## 2022-08-17 DIAGNOSIS — S238XXA Sprain of other specified parts of thorax, initial encounter: Secondary | ICD-10-CM

## 2022-08-17 DIAGNOSIS — I10 Essential (primary) hypertension: Secondary | ICD-10-CM | POA: Diagnosis not present

## 2022-08-17 DIAGNOSIS — Z125 Encounter for screening for malignant neoplasm of prostate: Secondary | ICD-10-CM

## 2022-08-17 DIAGNOSIS — M25561 Pain in right knee: Secondary | ICD-10-CM

## 2022-08-17 DIAGNOSIS — E118 Type 2 diabetes mellitus with unspecified complications: Secondary | ICD-10-CM | POA: Diagnosis not present

## 2022-08-17 DIAGNOSIS — E785 Hyperlipidemia, unspecified: Secondary | ICD-10-CM | POA: Diagnosis not present

## 2022-08-17 DIAGNOSIS — Z0001 Encounter for general adult medical examination with abnormal findings: Secondary | ICD-10-CM

## 2022-08-17 DIAGNOSIS — Z0000 Encounter for general adult medical examination without abnormal findings: Secondary | ICD-10-CM

## 2022-08-17 DIAGNOSIS — M25562 Pain in left knee: Secondary | ICD-10-CM

## 2022-08-17 LAB — AST: AST: 17 U/L (ref 0–37)

## 2022-08-17 LAB — LIPID PANEL
Cholesterol: 114 mg/dL (ref 0–200)
HDL: 45 mg/dL (ref >39.00)
LDL Cholesterol: 50 mg/dL (ref 0–99)
NonHDL: 69.25
Total CHOL/HDL Ratio: 3
Triglycerides: 94 mg/dL (ref 0.0–149.0)
VLDL: 18.8 mg/dL (ref 0.0–40.0)

## 2022-08-17 LAB — PSA: PSA: 1.66 ng/mL (ref 0.10–4.00)

## 2022-08-17 LAB — HEMOGLOBIN A1C: Hgb A1c MFr Bld: 5.9 % (ref 4.6–6.5)

## 2022-08-17 LAB — ALT: ALT: 17 U/L (ref 0–53)

## 2022-08-17 NOTE — Patient Instructions (Addendum)
Vaccines I recommend:  RSV vaccine  We are referring you to sports medicine for knee pain and chest pain.  Check the  blood pressure regularly BP GOAL is between 110/65 and  135/85. If it is consistently higher or lower, let me know      GO TO THE LAB : Get the blood work     [place], [place] back for checkup in 5 to 6 months    Do you have a "Living will" or "[place] of attorney"? (Advance care planning documents)  If you already have a living will or healthcare power of attorney, is recommended you bring the copy to be scanned in your chart. The document will be available to all the doctors you see in the system.  If you don't have one, please consider create one.  Advance care planning is a process that supports adults in  understanding and sharing their preferences regarding future medical care.   The patient's preferences are recorded in documents called Advance Directives and the can be modified at any time while the patient is in full mental capacity.   The documentation should be available at all times to the patient, the family and the healthcare providers.   This legal documents direct the family or surrogate to make the decision if the patient is not capable to do so.    Advance directives can be documented in many types of formats,  documents have names such as:  Lliving will  Durable power of attorney for healthcare (healthcare proxy or healthcare power of attorney)  Combined directives  Physician orders for life-sustaining treatment    More information at:  [email or web address]

## 2022-08-17 NOTE — Progress Notes (Signed)
Subjective:    Patient ID: Jimmy [name], male    DOB: 12-22-1950, 71 y.o.   MRN: [id number]  DOS:  08/17/2022 Type of visit - description: f/u  Routine follow-up  Chronic medical problems evaluated.   Sprain, chest, see LOV, better but still has a spot in the right upper chest that hurts from time to time.  He is still able to exercise.  Knee pain: Overall improving but he has his own goal of being able to go up - down the stairs using both knees.  Currently unable to put weight on the R knee.  He denies chest pain or difficulty breathing. No cough or sputum production No dysuria or gross hematuria.  No difficulty urinating.  Wt Readings from Last 3 Encounters:  08/17/22 263 lb 6 oz (119.5 kg)  07/06/22 267 lb (121.1 kg)  02/01/22 264 lb 8 oz (120 kg)     Review of Systems See above   Past Medical History:  Diagnosis Date   BCC (basal cell carcinoma of skin) 2013   dr Allyson Sabal    CKD (chronic kidney disease), stage III ([place])    Diabetes mellitus    Hypertension    Obesity     Past Surgical History:  Procedure Laterality Date   pilonidal cyst surgery     POLYPECTOMY      Current Outpatient Medications  Medication Instructions   aspirin 81 mg, Oral, Daily, Reported on 01/13/2016   atorvastatin (LIPITOR) 10 MG tablet Take 1/2 tablet (5 mg total) by mouth at bedtime.   Insulin Pen Needle (TECHLITE PEN NEEDLES) 32G X 6 MM MISC USE AS DIRECTED TO INJECT VICTOZA   losartan (COZAAR) 50 mg, Oral, Daily   metoprolol tartrate (LOPRESSOR) 25 mg, Oral, 2 times daily   tadalafil (CIALIS) 20 MG tablet TAKE 1 TABLET (20 MG TOTAL) BY MOUTH EVERY OTHER DAY AS NEEDED FOR ERECTILE DYSFUNCTION.   Victoza 1.2 mg, Subcutaneous, Daily   vitamin C 1,000 mg, Oral, 2 times daily   Vitamin D3 1,000 Units, Oral, 2 times daily,         Objective:   Physical Exam BP 134/82   Pulse (!) 59   Temp 98 F (36.7 C) (Oral)   Resp 16   Ht '5\' 10"'$  (1.778 m)   Wt 263 lb 6 oz (119.5 kg)    SpO2 98%   BMI 37.79 kg/m  General: Well developed, NAD, BMI noted Neck: No  thyromegaly  HEENT:  Normocephalic . Face symmetric, atraumatic Lungs:  CTA B Normal respiratory effort, no intercostal retractions, no accessory muscle use. Heart: RRR,  no murmur.  Abdomen:  Not distended, soft, non-tender. No rebound or rigidity.   Lower extremities: no pretibial edema bilaterally.  Knees have bony changes consistent with DJD, no effusion. Skin: Exposed areas without rash. Not pale. Not jaundice Neurologic:  alert & oriented X3.  Speech normal, gait somewhat limited by knee pain. Strength symmetric and appropriate for age.  Psych: Cognition and judgment appear intact.  Cooperative with normal attention span and concentration.  Behavior appropriate. No anxious or depressed appearing.     Assessment     Assessment  DM : was on glimeperide, it was d/c after wt loss; meds restarted ~ 2018 neuropathy: DX 05-2016  HTN Hyperlipidemia  Morbid Obesity CKD , sees nephrology, Creat ~1.9 [place] 2013 , no regular checks as off 07-2021  DOE 2013, saw cards echo diastolic dysfx,Rx---weight loss and exercise DOE 2018: Chest x-ray and echo  negative, could not afford stress test. rx observation     PLAN: Routine checkup. QL:RJPVG well with exercise, diet, on Victoza.  Check A1c.  Further advised with results. High cholesterol: On atorvastatin, checking lab AST ALT FLP. HTN: BP looks good, ambulatory BPs normal. CKD: Saw her renal  06/11/2022, note reviewed, creatinine was 1.6, potassium 4.6.  GFR approximately 40. No changes made Morbid obesity: Since he is retired he has more time to exercise, doing great. 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  Request further advice.  Referred to sports medicine. Chest sprain: Symptoms started after he lifted a 5 gallon container with the right  arm and felt a snap.  Still have some soreness there, always in the same spot.  Plan: Will ask sports medicine to assess. Preventive care reviewed. RTC 5 to 6 months   In addition to CPX, all chronic medical problems were evaluated.

## 2022-08-17 NOTE — Assessment & Plan Note (Signed)
-  Td 2018 -  PNM 23: 2011,2021;  prevnar 2015; PNM 20: today - RSV: recommended  -  zostavax 2014;  s/p shingrix x 2 - covid shots  booster : benefits>risks, declines  - had a Flu shot   -CCS: Colonoscopy 01/2010, tubular adenoma; cscope again 02-2015, next 10 years per report -Prostate cancer screening: no sxs, last DRE negative, check PSA [facility] POA discussed

## 2022-08-17 NOTE — Assessment & Plan Note (Signed)
Routine checkup. TG:PQDIY well with exercise, diet, on Victoza.  Check A1c.  Further advised with results. High cholesterol: On atorvastatin, checking lab AST ALT FLP. HTN: BP looks good, ambulatory BPs normal. CKD: Saw her renal  06/11/2022, note reviewed, creatinine was 1.6, potassium 4.6.  GFR approximately 40. No changes made Morbid obesity: Since he is retired he has more time to exercise, doing great. 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  Request further advice.  Referred to sports medicine. Chest sprain: Symptoms started after he lifted a 5 gallon container with the right arm and felt a snap.  Still have some soreness there, always in the same spot.  Plan: Will ask sports medicine to assess. Preventive care reviewed. RTC 5 to 6 months

## 2022-08-21 ENCOUNTER — Other Ambulatory Visit: Payer: Self-pay | Admitting: Internal Medicine

## 2022-08-26 NOTE — Progress Notes (Signed)
Rito Ehrlich, am serving as a Education administrator for Dr. Lynne Leader.  Subjective:    CC: Bilateral knee pain  HPI: Patient is a 71 year old male presenting with bilateral knee pain, R>L. Pt locates pain to medial right knee but only when the knee cap catches. patient has to use the left leg mostly because the right knee is weak and will buckle and fall. Patient has been going to the gym to try and build the muscle so he can have better mobility and strength. Patient does not have pain unless he is using the stairs and when he first starts swimming at the gym the knee will snap and hurt then get better.   Patient is also wanting right shoulder/chest pain that happened when he went to go pick up a five gallon bucket and heard something snap. Pain feels like a dull toothache and sometimes can be sharp in nature. Patient notices the pain when resting.   Knee swelling: not that he can tell, Dr. Larose Kells states he has seen swelling  Mechanical symptoms: yes Radiates: no Aggravates: Stairs Treatments tried:  Pertinent review of Systems: No fevers or chills  Relevant historical information: Hypertension.  Diabetes.  Obesity.   Objective:    Vitals:   08/31/22 0909  BP: 120/82  Pulse: (!) 58  SpO2: 98%   General: Well Developed, well nourished, and in no acute distress.   MSK: Right knee: Normal-appearing normal motion with crepitation.  Tender palpation medial joint line. Mild laxity and instability to medial joint stress.  Intact strength.  Left knee: Normal-appearing Normal motion with crepitation. Tender palpation medial joint line. Mild laxity and instability to medial joint line stress. Intact strength.  Right shoulder and chest are normal. Normal motion and strength.  Negative impingement testing.  Chest wall is nontender.  Lab and Radiology Results  X-ray images bilateral knees and right shoulder obtained today personally and independently interpreted  Right shoulder: Mild  glenohumeral DJD.  No acute fractures are visible.  Right hemothorax normal.  Per my read.  Right knee: Severe medial compartment DJD.  No acute fractures.  Left knee: Severe medial compartment DJD.  No acute fractures.  Await formal radiology review    Impression and Recommendations:    Assessment and Plan: 71 y.o. male with bilateral knee pain with some instability thought to be due primarily to medial compartment DJD.  He is a good candidate for trial of conservative management.  His goals are to be able to walk more normally and exercise without significant pain and dysfunction.  Plan for trial of physical therapy primarily focused on quad strengthening.  Additionally he may be a good candidate for medial off loader knee braces especially with his mild instability.  His leg shape may necessitate custom knee braces. Will contact [employer] representative to contact the patient.  Recheck in about 2 months.  Consider steroid injections now or then if needed.  The right shoulder pain is a bit of a mystery.  Await radiology overread.  Consider further workup in the future.  Marland Kitchen  PDMP not reviewed this encounter. Orders Placed This Encounter  Procedures   DG Knee AP/LAT W/[place] Left    Standing Status:   Future    Number of Occurrences:   1    Standing Expiration Date:   09/26/2022    Order Specific Question:   Reason for Exam (SYMPTOM  OR DIAGNOSIS REQUIRED)    Answer:   bilateral knee pain    Order Specific  Question:   Preferred imaging location?    Answer:   [facility]   DG Knee AP/LAT W/[place] Right    Standing Status:   Future    Number of Occurrences:   1    Standing Expiration Date:   09/26/2022    Order Specific Question:   Reason for Exam (SYMPTOM  OR DIAGNOSIS REQUIRED)    Answer:   bilateral knee pain    Order Specific Question:   Preferred imaging location?    Answer:   [facility]   DG Shoulder Right    Standing Status:   Future    Number of  Occurrences:   1    Standing Expiration Date:   09/01/2023    Order Specific Question:   Reason for Exam (SYMPTOM  OR DIAGNOSIS REQUIRED)    Answer:   eval shoulder pain    Order Specific Question:   Preferred imaging location?    Answer:   [facility]   Ambulatory referral to Physical Therapy    Referral Priority:   Routine    Referral Type:   Physical Medicine    Referral Reason:   Specialty Services Required    Requested Specialty:   Physical Therapy    Number of Visits Requested:   1   No orders of the defined types were placed in this encounter.   Discussed warning signs or symptoms. Please see discharge instructions. Patient expresses understanding.   The above documentation has been reviewed and is accurate and complete Lynne Leader, M.D.

## 2022-08-31 ENCOUNTER — Ambulatory Visit (INDEPENDENT_AMBULATORY_CARE_PROVIDER_SITE_OTHER): Payer: Medicare Other

## 2022-08-31 ENCOUNTER — Ambulatory Visit (INDEPENDENT_AMBULATORY_CARE_PROVIDER_SITE_OTHER): Payer: Medicare Other | Admitting: Family Medicine

## 2022-08-31 ENCOUNTER — Ambulatory Visit: Payer: Medicare Other

## 2022-08-31 VITALS — BP 120/82 | HR 58 | Ht 70.0 in | Wt 258.0 lb

## 2022-08-31 DIAGNOSIS — M25561 Pain in right knee: Secondary | ICD-10-CM | POA: Diagnosis not present

## 2022-08-31 DIAGNOSIS — M25511 Pain in right shoulder: Secondary | ICD-10-CM | POA: Diagnosis not present

## 2022-08-31 DIAGNOSIS — M25562 Pain in left knee: Secondary | ICD-10-CM

## 2022-08-31 DIAGNOSIS — G8929 Other chronic pain: Secondary | ICD-10-CM | POA: Diagnosis not present

## 2022-08-31 DIAGNOSIS — M170 Bilateral primary osteoarthritis of knee: Secondary | ICD-10-CM

## 2022-08-31 NOTE — Patient Instructions (Addendum)
Thank you for coming in today.   Please get an Xray today before you leave   I've referred you to Physical Therapy.  Let us know if you don't hear from them in one week.   Please use Voltaren gel (Generic Diclofenac Gel) up to 4x daily for pain as needed.  This is available over-the-counter as both the name brand Voltaren gel and the generic diclofenac gel.   You sholld hear from [place] [facility] about braces.   Consider Ozempic as Dr Larose Kells.   Recheck in 8-12 weeks.

## 2022-09-01 NOTE — Progress Notes (Signed)
Right knee x-ray shows arthritis changes.

## 2022-09-01 NOTE — Progress Notes (Signed)
Right shoulder x-ray shows some arthritis changes.

## 2022-09-01 NOTE — Progress Notes (Signed)
Left knee x-ray shows arthritis changes.

## 2022-09-15 ENCOUNTER — Ambulatory Visit: Payer: Medicare Other | Attending: Family Medicine | Admitting: Physical Therapy

## 2022-09-15 DIAGNOSIS — G8929 Other chronic pain: Secondary | ICD-10-CM | POA: Diagnosis present

## 2022-09-15 DIAGNOSIS — M25661 Stiffness of right knee, not elsewhere classified: Secondary | ICD-10-CM

## 2022-09-15 DIAGNOSIS — M25662 Stiffness of left knee, not elsewhere classified: Secondary | ICD-10-CM | POA: Diagnosis present

## 2022-09-15 DIAGNOSIS — M25561 Pain in right knee: Secondary | ICD-10-CM | POA: Insufficient documentation

## 2022-09-15 DIAGNOSIS — M6281 Muscle weakness (generalized): Secondary | ICD-10-CM | POA: Insufficient documentation

## 2022-09-15 DIAGNOSIS — R262 Difficulty in walking, not elsewhere classified: Secondary | ICD-10-CM | POA: Diagnosis present

## 2022-09-15 DIAGNOSIS — M170 Bilateral primary osteoarthritis of knee: Secondary | ICD-10-CM | POA: Diagnosis not present

## 2022-09-15 DIAGNOSIS — M25562 Pain in left knee: Secondary | ICD-10-CM | POA: Insufficient documentation

## 2022-09-15 NOTE — Therapy (Signed)
OUTPATIENT PHYSICAL THERAPY LOWER EXTREMITY EVALUATION   Patient Name: Jimmy [name] MRN: [id number] DOB:1950/09/04, 72 y.o., male Today's Date: 09/15/2022  END OF SESSION:  PT End of Session - 09/15/22 1106     Visit Number 1    Number of Visits 12    Date for PT Re-Evaluation 11/03/22    Authorization Type Medicare + [employer]    Progress Note Due on Visit 10    PT Start Time 0935    PT Stop Time 1030    PT Time Calculation (min) 55 min    Activity Tolerance Patient tolerated treatment well    Behavior During Therapy [facility] for tasks assessed/performed             Past Medical History:  Diagnosis Date   BCC (basal cell carcinoma of skin) 2013   dr Allyson Sabal    CKD (chronic kidney disease), stage III ([place])    Diabetes mellitus    Hypertension    Obesity    Past Surgical History:  Procedure Laterality Date   pilonidal cyst surgery     POLYPECTOMY     Patient Active Problem List   Diagnosis Date Noted   PCP NOTES >>>> 07/11/2015   Dyspnea on exertion 04/03/2012   BCC (basal cell carcinoma of skin) 04/03/2012   Annual physical exam 01/13/2012   Edema 03/15/2011   Hyperlipidemia 03/15/2011   Constipation 03/15/2011   ERECTILE DYSFUNCTION, NON-ORGANIC 07/03/2010   CKD (chronic kidney disease), stage III ([place]) 06/18/2008   DM II (diabetes mellitus, type II), controlled ([place]) 11/09/2006   Morbid obesity ([place]) 11/09/2006   HTN (hypertension) 11/09/2006    PCP: Colon Branch, MD  REFERRING PROVIDER: Gregor Hams, MD   REFERRING DIAG:  M25.561,M25.562,G89.29 (ICD-10-CM) - Chronic pain of both knees  M17.0 (ICD-10-CM) - Primary osteoarthritis of both knees    THERAPY DIAG:  Chronic pain of right knee  Chronic pain of left knee  Stiffness of right knee, not elsewhere classified  Stiffness of left knee, not elsewhere classified  Difficulty in walking, not elsewhere classified  Muscle weakness (generalized)  Rationale for Evaluation and Treatment:  Rehabilitation  ONSET DATE: chronic  SUBJECTIVE:   SUBJECTIVE STATEMENT: Patient reports that he just retired 3 months ago and joined gym, he has been trying to exercise and strengthen his legs, but he is having trouble with knees, especially his R knee.  It frequently has sharp pain and buckles and is not as strong as left.  He has OA in medial compartment "bone on bone" both knees, and ref provider has ordered [employer] brace.  He is using weight equipment and swimming, but even with swimming feels his R knee have sharp pain and "go out."  PERTINENT HISTORY: DM PAIN:  Are you having pain? Yes: NPRS scale: 1 /10 Pain location: R knee Pain description: normal ache 1, but intermittent sharp shooting pain is 8/10, feels like knee cap out of place, doesn't trust knee Aggravating factors: moving knee wrong, doesn't trust going up stairs Relieving factors: stretching   Are you having pain? Yes: NPRS scale: 1-2/10 Pain location: L knee  Pain description: ache Aggravating factors: exercise Relieving factors: stretching  PRECAUTIONS: None  WEIGHT BEARING RESTRICTIONS: No  FALLS:  Has patient fallen in last 6 months? No  LIVING ENVIRONMENT: Lives with: lives with their spouse Lives in: House/apartment Stairs: Yes: Internal: 14 steps; on right going up Has following equipment at home:  exercise bike, exercise step, hand weights, elastic bands,  balance pad  OCCUPATION: retired   PLOF: Independent  PATIENT GOALS: be able to walk up stairs with alternating feet and no handrail - if I can do that the rest will take care of itself.   NEXT MD VISIT:  11/09/22  OBJECTIVE:   DIAGNOSTIC FINDINGS: 08/31/2022 DG R knee FINDINGS: Tricompartmental degenerative changes are noted worst in the medial joint space. No fracture is noted. No sizable joint effusion is seen. Multiple calcified loose bodies are noted. No other soft tissue abnormality is seen. 08/31/2022   L knee FINDINGS: Tricompartmental  degenerative changes are noted worst in the medial joint space. No joint effusion is seen. No acute fracture or dislocation is noted. Small calcified loose bodies are noted.  PATIENT SURVEYS:  LEFS 42/80  COGNITION: Overall cognitive status: Within functional limits for tasks assessed     SENSATION: Some neuropathy in feet   MUSCLE LENGTH: Hamstrings: moderate tightness bil Right 60 deg; Left 60 deg   POSTURE: forward head  PALPATION: Tenderness over bil MCL, R LCL  LOWER EXTREMITY ROM:  Active ROM Right eval Left eval  Knee flexion 120 115  Knee extension 8 7   (Blank rows = not tested)  LOWER EXTREMITY MMT:  MMT Right eval Left eval  Hip flexion 4+ 4+  Hip extension 5 5  Hip abduction 5 5  Hip adduction 5 5  Knee flexion 5 5  Knee extension 5 5  Ankle dorsiflexion 5 5  Ankle plantarflexion 5 5   (Blank rows = not tested)  LOWER EXTREMITY SPECIAL TESTS:  All ligaments intact bil knees, no concerns for meniscus.     FUNCTIONAL TESTS:  5 times sit to stand: 9.6 sec without UE assist, table height 20 inches.   GAIT: Distance walked: 48' Assistive device utilized: None Level of assistance: Complete Independence Comments: antalgic/Trendelenburg gait, step to gait with HR ascending, step to gait with HR descending with body angled sideways.    TODAY'S TREATMENT:                                                                                                                              DATE:   Therapeutic Exercise: to improve strength and mobility.  Demo, verbal and tactile cues throughout for technique. - Standing Hip Abduction with Resistance at Ankles and Counter Support  GTB 10 reps - Standing Hip Extension with Resistance at Ankles and Unilateral Counter Support GTB  10 reps - Standing Hip Flexion with Resistance at Ankles and Unilateral Counter Support  -GTB 10 reps - Squat with Chair and Counter Support  - 10 reps   PATIENT EDUCATION:  Education  details: findings, POC, initial HEP, issued GTB Person educated: Patient Education method: Explanation, Demonstration, Verbal cues, and Handouts Education comprehension: verbalized understanding and returned demonstration  HOME EXERCISE PROGRAM: Access Code: [email or web address] URL: [email or web address] Date: 09/15/2022 Prepared by: Glenetta Hew   ASSESSMENT:  CLINICAL IMPRESSION: Patient is a 72 y.o. male who  was seen today for physical therapy evaluation and treatment for bil chronic knee pain/knee OA.   He reports mild ache in bil knees that does not generally bother him, but has sharp, shooting pain with certain movements especially stairs in R knee, causing it to buckle.  He has been consistently going to gym for 3 months using weight machines and swimming.  He is lacking full extension in both knees, and noted increased crepitus in R knee with stepping down of 6" step.  He is unable to step down full height step with R knee in stance and lacks strength to step up on R.  Jimmy [name] would benefit from skilled physical therapy to improve RLE strength, ROM, and gait and pain in order to improve safety, improve community navigation, and decrease risk of falls.   OBJECTIVE IMPAIRMENTS: Abnormal gait, decreased activity tolerance, difficulty walking, decreased ROM, decreased strength, hypomobility, impaired flexibility, and pain.   ACTIVITY LIMITATIONS: bending, standing, squatting, stairs, transfers, and locomotion level  PARTICIPATION LIMITATIONS: community activity  PERSONAL FACTORS: Fitness, Time since onset of injury/illness/exacerbation, and 3+ comorbidities: obesity, T2DM, CKD, HTN, osteoarthritis  are also affecting patient's functional outcome.   REHAB POTENTIAL: Good  CLINICAL DECISION MAKING: Evolving/moderate complexity  EVALUATION COMPLEXITY: Moderate   GOALS: Goals reviewed with patient? Yes  SHORT TERM GOALS: Target date: 09/29/2022   Patient will be  independent with initial HEP. Baseline: given Goal status: INITIAL   LONG TERM GOALS: Target date: 11/03/2022   Patient will be independent with advanced/ongoing HEP to improve outcomes and carryover.  Baseline:  Goal status: INITIAL  2.  Patient will report at least 75% improvement in R knee buckling/sharp pain to improve QOL. Baseline: intermittent but daily Goal status: INITIAL  3.  Patient will be able to ascend/descend stairs with 1 HR and reciprocal step pattern safely to access home and community.  Baseline: ascends with step to gait and HR, descends step to gait slightly sideways and HR.  Goal status: INITIAL  4.  Patient will report >50/80 on LEFS to demonstrate improved functional ability. Baseline: 42/80 = 52.5% ability  Goal status: INITIAL  5.  Patient will demonstrate at least >22/30 on FGA to decrease low risk of of falls. Baseline: NT Goal status: INITIAL   6.  Patient will demonstrate improved hamstring flexibility with SLR to 75 deg to decrease muscle imbalance and improve knee extension.  Baseline: lacking full knee extension bil, moderate hamstring tightness ~ 60 deg bil.  Goal status: INITIAL    PLAN:  PT FREQUENCY: 1-2x/week  PT DURATION: 7 weeks (missing first week due to vacation)  PLANNED INTERVENTIONS: Therapeutic exercises, Therapeutic activity, Neuromuscular re-education, Balance training, Gait training, Patient/Family education, Self Care, Joint mobilization, Stair training, Dry Needling, Electrical stimulation, Spinal mobilization, Cryotherapy, Moist heat, Taping, Ultrasound, Ionotophoresis '4mg'$ /ml Dexamethasone, Manual therapy, and Re-evaluation  PLAN FOR NEXT SESSION: review and progress HEP, focus on glute strengthening, stairs, eccentric strengthening.  Try KT taping for knee?   Rennie Natter, PT, DPT  09/15/2022, 11:12 AM

## 2022-09-22 ENCOUNTER — Ambulatory Visit: Payer: Medicare Other | Admitting: Physical Therapy

## 2022-09-28 ENCOUNTER — Other Ambulatory Visit: Payer: Self-pay | Admitting: Internal Medicine

## 2022-09-28 ENCOUNTER — Ambulatory Visit: Payer: Medicare Other | Admitting: Physical Therapy

## 2022-09-28 ENCOUNTER — Encounter: Payer: Self-pay | Admitting: Physical Therapy

## 2022-09-28 DIAGNOSIS — M25662 Stiffness of left knee, not elsewhere classified: Secondary | ICD-10-CM

## 2022-09-28 DIAGNOSIS — M25661 Stiffness of right knee, not elsewhere classified: Secondary | ICD-10-CM

## 2022-09-28 DIAGNOSIS — M25561 Pain in right knee: Secondary | ICD-10-CM | POA: Diagnosis not present

## 2022-09-28 DIAGNOSIS — R262 Difficulty in walking, not elsewhere classified: Secondary | ICD-10-CM

## 2022-09-28 DIAGNOSIS — M6281 Muscle weakness (generalized): Secondary | ICD-10-CM

## 2022-09-28 DIAGNOSIS — G8929 Other chronic pain: Secondary | ICD-10-CM

## 2022-09-28 NOTE — Therapy (Signed)
OUTPATIENT PHYSICAL THERAPY TREATMENT   Patient Name: Jimmy [name] MRN: [id number] DOB:07-25-1951, 72 y.o., male Today's Date: 09/28/2022  END OF SESSION:  PT End of Session - 09/28/22 0934     Visit Number 2    Number of Visits 12    Date for PT Re-Evaluation 11/03/22    Authorization Type Medicare + [employer]    Progress Note Due on Visit 10    PT Start Time 0930    PT Stop Time 1015    PT Time Calculation (min) 45 min    Activity Tolerance Patient tolerated treatment well    Behavior During Therapy [facility] for tasks assessed/performed             Past Medical History:  Diagnosis Date   BCC (basal cell carcinoma of skin) 2013   dr Allyson Sabal    CKD (chronic kidney disease), stage III ([place])    Diabetes mellitus    Hypertension    Obesity    Past Surgical History:  Procedure Laterality Date   pilonidal cyst surgery     POLYPECTOMY     Patient Active Problem List   Diagnosis Date Noted   PCP NOTES >>>> 07/11/2015   Dyspnea on exertion 04/03/2012   BCC (basal cell carcinoma of skin) 04/03/2012   Annual physical exam 01/13/2012   Edema 03/15/2011   Hyperlipidemia 03/15/2011   Constipation 03/15/2011   ERECTILE DYSFUNCTION, NON-ORGANIC 07/03/2010   CKD (chronic kidney disease), stage III ([place]) 06/18/2008   DM II (diabetes mellitus, type II), controlled ([place]) 11/09/2006   Morbid obesity ([place]) 11/09/2006   HTN (hypertension) 11/09/2006    PCP: Colon Branch, MD  REFERRING PROVIDER: Gregor Hams, MD   REFERRING DIAG:  M25.561,M25.562,G89.29 (ICD-10-CM) - Chronic pain of both knees  M17.0 (ICD-10-CM) - Primary osteoarthritis of both knees    THERAPY DIAG:  Chronic pain of right knee  Chronic pain of left knee  Stiffness of right knee, not elsewhere classified  Stiffness of left knee, not elsewhere classified  Difficulty in walking, not elsewhere classified  Muscle weakness (generalized)  Rationale for Evaluation and Treatment:  Rehabilitation  ONSET DATE: chronic  SUBJECTIVE:   SUBJECTIVE STATEMENT: Patient reports he just got back from trip to [place]. Knee is the same.  Snaps and hurts randomly, otherwise no pain.  Went for walks daily in [place].  Pleased he was able to walk.     PERTINENT HISTORY: DM PAIN:  Are you having pain? Yes: NPRS scale: 1 /10 Pain location: R knee Pain description: normal ache 1, but intermittent sharp shooting pain is 8/10, feels like knee cap out of place, doesn't trust knee Aggravating factors: moving knee wrong, doesn't trust going up stairs Relieving factors: stretching   Are you having pain? Yes: NPRS scale: 1/10 Pain location: L knee  Pain description: ache Aggravating factors: exercise Relieving factors: stretching  PRECAUTIONS: None  WEIGHT BEARING RESTRICTIONS: No  FALLS:  Has patient fallen in last 6 months? No  LIVING ENVIRONMENT: Lives with: lives with their spouse Lives in: House/apartment Stairs: Yes: Internal: 14 steps; on right going up Has following equipment at home:  exercise bike, exercise step, hand weights, elastic bands, balance pad  OCCUPATION: retired   PLOF: Independent  PATIENT GOALS: be able to walk up stairs with alternating feet and no handrail - if I can do that the rest will take care of itself.   NEXT MD VISIT:  11/09/22  OBJECTIVE:   DIAGNOSTIC FINDINGS: 08/31/2022 DG  R knee FINDINGS: Tricompartmental degenerative changes are noted worst in the medial joint space. No fracture is noted. No sizable joint effusion is seen. Multiple calcified loose bodies are noted. No other soft tissue abnormality is seen. 08/31/2022   L knee FINDINGS: Tricompartmental degenerative changes are noted worst in the medial joint space. No joint effusion is seen. No acute fracture or dislocation is noted. Small calcified loose bodies are noted.  PATIENT SURVEYS:  LEFS 42/80  COGNITION: Overall cognitive status: Within functional limits for tasks  assessed     SENSATION: Some neuropathy in feet   MUSCLE LENGTH: Hamstrings: moderate tightness bil Right 60 deg; Left 60 deg   POSTURE: forward head  PALPATION: Tenderness over bil MCL, R LCL  LOWER EXTREMITY ROM:  Active ROM Right eval Left eval  Knee flexion 120 115  Knee extension 8 7   (Blank rows = not tested)  LOWER EXTREMITY MMT:  MMT Right eval Left eval  Hip flexion 4+ 4+  Hip extension 5 5  Hip abduction 5 5  Hip adduction 5 5  Knee flexion 5 5  Knee extension 5 5  Ankle dorsiflexion 5 5  Ankle plantarflexion 5 5   (Blank rows = not tested)  LOWER EXTREMITY SPECIAL TESTS:  All ligaments intact bil knees, no concerns for meniscus.     FUNCTIONAL TESTS:  5 times sit to stand: 9.6 sec without UE assist, table height 20 inches.   GAIT: Distance walked: 26' Assistive device utilized: None Level of assistance: Complete Independence Comments: antalgic/Trendelenburg gait, step to gait with HR ascending, step to gait with HR descending with body angled sideways.    TODAY'S TREATMENT:                                                                                                                              DATE:  09/28/22 Therapeutic Exercise: to improve strength and mobility.  Demo, verbal and tactile cues throughout for technique. Bike L2 x 6 min  Step ups - x 10 on LLE 4" step, x 10 on 2" step on RLE - noted increased crepitus and grinding in R knee even with step lowered.  Forward T's - counter support x 10 bil  Wall squats 5 x 15 sec hold Toe raises while leaning against wall x 10 Heel raises x 15  Straight leg raise 2 x 10 bil  Bridges 3 x 10  S/L clams 2 x 10 bil    09/15/2022 Therapeutic Exercise: to improve strength and mobility.  Demo, verbal and tactile cues throughout for technique. - Standing Hip Abduction with Resistance at Ankles and Counter Support  GTB 10 reps - Standing Hip Extension with Resistance at Ankles and Unilateral Counter  Support GTB  10 reps - Standing Hip Flexion with Resistance at Ankles and Unilateral Counter Support  -GTB 10 reps - Squat with Chair and Counter Support  - 10 reps   PATIENT EDUCATION:  Education details:  HEP update Person educated: Patient Education method: Explanation, Demonstration, Verbal cues, and Handouts Education comprehension: verbalized understanding and returned demonstration  HOME EXERCISE PROGRAM: Access Code: [email or web address] URL: [email or web address] Date: 09/28/2022 Prepared by: Glenetta Hew  Exercises - [place]  - 1 x daily - 7 x weekly - 3 sets - 10 reps - Toe Raise With Back Against Wall  - 1 x daily - 7 x weekly - 3 sets - 10 reps - Forward T with Counter Support  - 1 x daily - 7 x weekly - 2-3 sets - 10 reps - Supine Straight Leg Raises  - 1 x daily - 7 x weekly - 2-3 sets - 10 reps - Supine Bridge  - 1 x daily - 7 x weekly - 2-3 sets - 10 reps - Clamshell  - 1 x daily - 7 x weekly - 2-3 sets - 10 reps   ASSESSMENT:  CLINICAL IMPRESSION: [place] Kuang Lourenco reports he was able to walk in [place], but too busy to perform HEP.    Today noted continued crepitus, grinding and clicking in R knee stepping up, even after step lowered from 4" to 2", so focused on exercises with fixed knee position for glut/quad/HS strengthening, which he tolerated well but still felt challenged with.  Updated HEP with these exercises today.  He is supposed to get brace for knee later this week. Marshall Cork Runquist continues to demonstrate potential for improvement and would benefit from continued skilled therapy to address impairments.     OBJECTIVE IMPAIRMENTS: Abnormal gait, decreased activity tolerance, difficulty walking, decreased ROM, decreased strength, hypomobility, impaired flexibility, and pain.   ACTIVITY LIMITATIONS: bending, standing, squatting, stairs, transfers, and locomotion level  PARTICIPATION LIMITATIONS: community activity  PERSONAL FACTORS: Fitness,  Time since onset of injury/illness/exacerbation, and 3+ comorbidities: obesity, T2DM, CKD, HTN, osteoarthritis  are also affecting patient's functional outcome.   REHAB POTENTIAL: Good  CLINICAL DECISION MAKING: Evolving/moderate complexity  EVALUATION COMPLEXITY: Moderate   GOALS: Goals reviewed with patient? Yes  SHORT TERM GOALS: Target date: 09/29/2022   Patient will be independent with initial HEP. Baseline: given Goal status: IN PROGRESS 09/28/22- too busy on vacation to do, tried 1x.    LONG TERM GOALS: Target date: 11/03/2022   Patient will be independent with advanced/ongoing HEP to improve outcomes and carryover.  Baseline:  Goal status: IN PROGRESS  2.  Patient will report at least 75% improvement in R knee buckling/sharp pain to improve QOL. Baseline: intermittent but daily Goal status: IN PROGRESS  3.  Patient will be able to ascend/descend stairs with 1 HR and reciprocal step pattern safely to access home and community.  Baseline: ascends with step to gait and HR, descends step to gait slightly sideways and HR.  Goal status: IN PROGRESS  4.  Patient will report >50/80 on LEFS to demonstrate improved functional ability. Baseline: 42/80 = 52.5% ability  Goal status: IN PROGRESS  5.  Patient will demonstrate at least >22/30 on FGA to decrease low risk of of falls. Baseline: NT Goal status: IN PROGRESS   6.  Patient will demonstrate improved hamstring flexibility with SLR to 75 deg to decrease muscle imbalance and improve knee extension.  Baseline: lacking full knee extension bil, moderate hamstring tightness ~ 60 deg bil.  Goal status: IN PROGRESS    PLAN:  PT FREQUENCY: 1-2x/week  PT DURATION: 7 weeks (missing first week due to vacation)  PLANNED INTERVENTIONS: Therapeutic exercises, Therapeutic activity, Neuromuscular re-education, Balance training, Gait training, Patient/Family  education, Self Care, Joint mobilization, Stair training, Dry Needling,  Electrical stimulation, Spinal mobilization, Cryotherapy, Moist heat, Taping, Ultrasound, Ionotophoresis '4mg'$ /ml Dexamethasone, Manual therapy, and Re-evaluation  PLAN FOR NEXT SESSION: review and progress HEP, focus on glute strengthening, stairs, eccentric strengthening.  Try KT taping for knee?   Rennie Natter, PT, DPT  09/28/2022, 10:15 AM

## 2022-09-30 ENCOUNTER — Encounter: Payer: Self-pay | Admitting: Internal Medicine

## 2022-10-01 ENCOUNTER — Encounter: Payer: Self-pay | Admitting: Physical Therapy

## 2022-10-01 ENCOUNTER — Ambulatory Visit: Payer: Medicare Other | Attending: Family Medicine | Admitting: Physical Therapy

## 2022-10-01 DIAGNOSIS — M6281 Muscle weakness (generalized): Secondary | ICD-10-CM | POA: Diagnosis present

## 2022-10-01 DIAGNOSIS — M25662 Stiffness of left knee, not elsewhere classified: Secondary | ICD-10-CM | POA: Insufficient documentation

## 2022-10-01 DIAGNOSIS — G8929 Other chronic pain: Secondary | ICD-10-CM | POA: Diagnosis present

## 2022-10-01 DIAGNOSIS — M25661 Stiffness of right knee, not elsewhere classified: Secondary | ICD-10-CM | POA: Diagnosis present

## 2022-10-01 DIAGNOSIS — R262 Difficulty in walking, not elsewhere classified: Secondary | ICD-10-CM | POA: Insufficient documentation

## 2022-10-01 DIAGNOSIS — M25562 Pain in left knee: Secondary | ICD-10-CM | POA: Diagnosis present

## 2022-10-01 DIAGNOSIS — M25561 Pain in right knee: Secondary | ICD-10-CM | POA: Insufficient documentation

## 2022-10-01 NOTE — Therapy (Signed)
OUTPATIENT PHYSICAL THERAPY TREATMENT   Patient Name: Jimmy [name] MRN: [id number] DOB:1951-04-16, 72 y.o., male Today's Date: 10/01/2022  END OF SESSION:  PT End of Session - 10/01/22 0932     Visit Number 3    Number of Visits 12    Date for PT Re-Evaluation 11/03/22    Authorization Type Medicare + [employer]    Progress Note Due on Visit 10    PT Start Time 0934    PT Stop Time 1015    PT Time Calculation (min) 41 min    Activity Tolerance Patient tolerated treatment well    Behavior During Therapy [facility] for tasks assessed/performed             Past Medical History:  Diagnosis Date   BCC (basal cell carcinoma of skin) 2013   dr Allyson Sabal    CKD (chronic kidney disease), stage III ([place])    Diabetes mellitus    Hypertension    Obesity    Past Surgical History:  Procedure Laterality Date   pilonidal cyst surgery     POLYPECTOMY     Patient Active Problem List   Diagnosis Date Noted   PCP NOTES >>>> 07/11/2015   Dyspnea on exertion 04/03/2012   BCC (basal cell carcinoma of skin) 04/03/2012   Annual physical exam 01/13/2012   Edema 03/15/2011   Hyperlipidemia 03/15/2011   Constipation 03/15/2011   ERECTILE DYSFUNCTION, NON-ORGANIC 07/03/2010   CKD (chronic kidney disease), stage III ([place]) 06/18/2008   DM II (diabetes mellitus, type II), controlled ([place]) 11/09/2006   Morbid obesity ([place]) 11/09/2006   HTN (hypertension) 11/09/2006    PCP: Colon Branch, MD  REFERRING PROVIDER: Gregor Hams, MD   REFERRING DIAG:  M25.561,M25.562,G89.29 (ICD-10-CM) - Chronic pain of both knees  M17.0 (ICD-10-CM) - Primary osteoarthritis of both knees    THERAPY DIAG:  Chronic pain of right knee  Chronic pain of left knee  Stiffness of right knee, not elsewhere classified  Stiffness of left knee, not elsewhere classified  Difficulty in walking, not elsewhere classified  Muscle weakness (generalized)  Rationale for Evaluation and Treatment:  Rehabilitation  ONSET DATE: chronic  SUBJECTIVE:   SUBJECTIVE STATEMENT: Patient reports knees are about the same.   Still grinding.  Did his exercises Weds and Thurs, no pain during exercises but got a little sore afterwards.       PERTINENT HISTORY: DM PAIN:  Are you having pain? Yes: NPRS scale: 1 /10 Pain location: R knee Pain description: normal ache 1, but intermittent sharp shooting pain is 8/10, feels like knee cap out of place, doesn't trust knee Aggravating factors: moving knee wrong, doesn't trust going up stairs Relieving factors: stretching   Are you having pain? Yes: NPRS scale: 1/10 Pain location: L knee  Pain description: ache Aggravating factors: exercise Relieving factors: stretching  PRECAUTIONS: None  WEIGHT BEARING RESTRICTIONS: No  FALLS:  Has patient fallen in last 6 months? No  LIVING ENVIRONMENT: Lives with: lives with their spouse Lives in: House/apartment Stairs: Yes: Internal: 14 steps; on right going up Has following equipment at home:  exercise bike, exercise step, hand weights, elastic bands, balance pad  OCCUPATION: retired   PLOF: Independent  PATIENT GOALS: be able to walk up stairs with alternating feet and no handrail - if I can do that the rest will take care of itself.   NEXT MD VISIT:  11/09/22  OBJECTIVE:   DIAGNOSTIC FINDINGS: 08/31/2022 DG R knee FINDINGS: Tricompartmental degenerative changes  are noted worst in the medial joint space. No fracture is noted. No sizable joint effusion is seen. Multiple calcified loose bodies are noted. No other soft tissue abnormality is seen. 08/31/2022   L knee FINDINGS: Tricompartmental degenerative changes are noted worst in the medial joint space. No joint effusion is seen. No acute fracture or dislocation is noted. Small calcified loose bodies are noted.  PATIENT SURVEYS:  LEFS 42/80  COGNITION: Overall cognitive status: Within functional limits for tasks  assessed     SENSATION: Some neuropathy in feet   MUSCLE LENGTH: Hamstrings: moderate tightness bil Right 60 deg; Left 60 deg   POSTURE: forward head  PALPATION: Tenderness over bil MCL, R LCL  LOWER EXTREMITY ROM:  Active ROM Right eval Left eval  Knee flexion 120 115  Knee extension 8 7   (Blank rows = not tested)  LOWER EXTREMITY MMT:  MMT Right eval Left eval  Hip flexion 4+ 4+  Hip extension 5 5  Hip abduction 5 5  Hip adduction 5 5  Knee flexion 5 5  Knee extension 5 5  Ankle dorsiflexion 5 5  Ankle plantarflexion 5 5   (Blank rows = not tested)  LOWER EXTREMITY SPECIAL TESTS:  All ligaments intact bil knees, no concerns for meniscus.     FUNCTIONAL TESTS:  5 times sit to stand: 9.6 sec without UE assist, table height 20 inches.   GAIT: Distance walked: 37' Assistive device utilized: None Level of assistance: Complete Independence Comments: antalgic/Trendelenburg gait, step to gait with HR ascending, step to gait with HR descending with body angled sideways.    TODAY'S TREATMENT:                                                                                                                              DATE:  10/01/22 Therapeutic Exercise: to improve strength and mobility.  Demo, verbal and tactile cues throughout for technique. Nustep L6 x 6 min  Straight leg raise 2# 2 x 10 bil  S/L hip abduction  2 x 10 bil (weight removed due to torque on knee and catching) S/L clamshell 2x 10 bil  Prone hip extension 2 x 10 bil 2#  Bridges with ball squeeze 2 x 10 bil  Hamstring stretch supine with strap 2 x 1 min bil  Seated Hamstring stretch x 30 sec bil - difficulty straightening knee  09/28/22 Therapeutic Exercise: to improve strength and mobility.  Demo, verbal and tactile cues throughout for technique. Bike L2 x 6 min  Step ups - x 10 on LLE 4" step, x 10 on 2" step on RLE - noted increased crepitus and grinding in R knee even with step lowered.   Forward T's - counter support x 10 bil  Wall squats 5 x 15 sec hold Toe raises while leaning against wall x 10 Heel raises x 15  Straight leg raise 2 x 10 bil  Bridges 3 x 10  S/L  clams 2 x 10 bil    09/15/2022 Therapeutic Exercise: to improve strength and mobility.  Demo, verbal and tactile cues throughout for technique. - Standing Hip Abduction with Resistance at Ankles and Counter Support  GTB 10 reps - Standing Hip Extension with Resistance at Ankles and Unilateral Counter Support GTB  10 reps - Standing Hip Flexion with Resistance at Ankles and Unilateral Counter Support  -GTB 10 reps - Squat with Chair and Counter Support  - 10 reps   PATIENT EDUCATION:  Education details: HEP update Person educated: Patient Education method: Explanation, Demonstration, Verbal cues, and Handouts Education comprehension: verbalized understanding and returned demonstration  HOME EXERCISE PROGRAM: Access Code: [email or web address] URL: [email or web address] Date: 10/01/2022 Prepared by: Glenetta Hew  Exercises  Added  - Prone Hip Extension  - 1 x daily - 7 x weekly - 2-3 sets - 10 reps - Seated Hamstring Stretch with Chair  - 1 x daily - 7 x weekly - 3 sets - 10 reps - Supine Hamstring Stretch with Strap  - 1 x daily - 7 x weekly - 3 sets - 10 reps   ASSESSMENT:  CLINICAL IMPRESSION: Jimmy [name] reports good compliance with exercises but some soreness in hip extensors, reassure this was normal and should improve as muscles strengthening.  Today focused on supine hip strengthening exercises followed by hamstring stretches.  Added hamstring stretches and prone hip extension to HEP as hamstrings are very tight and limiting knee extension bil.  Jimmy [name] continues to demonstrate potential for improvement and would benefit from continued skilled therapy to address impairments.     OBJECTIVE IMPAIRMENTS: Abnormal gait, decreased activity tolerance, difficulty walking, decreased ROM,  decreased strength, hypomobility, impaired flexibility, and pain.   ACTIVITY LIMITATIONS: bending, standing, squatting, stairs, transfers, and locomotion level  PARTICIPATION LIMITATIONS: community activity  PERSONAL FACTORS: Fitness, Time since onset of injury/illness/exacerbation, and 3+ comorbidities: obesity, T2DM, CKD, HTN, osteoarthritis  are also affecting patient's functional outcome.   REHAB POTENTIAL: Good  CLINICAL DECISION MAKING: Evolving/moderate complexity  EVALUATION COMPLEXITY: Moderate   GOALS: Goals reviewed with patient? Yes  SHORT TERM GOALS: Target date: 09/29/2022   Patient will be independent with initial HEP. Baseline: given Goal status: IN PROGRESS 09/28/22- too busy on vacation to do, tried 1x.    LONG TERM GOALS: Target date: 11/03/2022   Patient will be independent with advanced/ongoing HEP to improve outcomes and carryover.  Baseline:  Goal status: IN PROGRESS  2.  Patient will report at least 75% improvement in R knee buckling/sharp pain to improve QOL. Baseline: intermittent but daily Goal status: IN PROGRESS  3.  Patient will be able to ascend/descend stairs with 1 HR and reciprocal step pattern safely to access home and community.  Baseline: ascends with step to gait and HR, descends step to gait slightly sideways and HR.  Goal status: IN PROGRESS  4.  Patient will report >50/80 on LEFS to demonstrate improved functional ability. Baseline: 42/80 = 52.5% ability  Goal status: IN PROGRESS  5.  Patient will demonstrate at least >22/30 on FGA to decrease low risk of of falls. Baseline: NT Goal status: IN PROGRESS   6.  Patient will demonstrate improved hamstring flexibility with SLR to 75 deg to decrease muscle imbalance and improve knee extension.  Baseline: lacking full knee extension bil, moderate hamstring tightness ~ 60 deg bil.  Goal status: IN PROGRESS    PLAN:  PT FREQUENCY: 1-2x/week  PT DURATION: 7 weeks (missing first week  due  to vacation)  PLANNED INTERVENTIONS: Therapeutic exercises, Therapeutic activity, Neuromuscular re-education, Balance training, Gait training, Patient/Family education, Self Care, Joint mobilization, Stair training, Dry Needling, Electrical stimulation, Spinal mobilization, Cryotherapy, Moist heat, Taping, Ultrasound, Ionotophoresis '4mg'$ /ml Dexamethasone, Manual therapy, and Re-evaluation  PLAN FOR NEXT SESSION: review and progress HEP, focus on glute strengthening, stairs, eccentric strengthening.     Rennie Natter, PT, DPT  10/01/2022, 11:27 AM

## 2022-10-05 ENCOUNTER — Encounter: Payer: Self-pay | Admitting: Physical Therapy

## 2022-10-05 ENCOUNTER — Ambulatory Visit: Payer: Medicare Other | Admitting: Physical Therapy

## 2022-10-05 DIAGNOSIS — R262 Difficulty in walking, not elsewhere classified: Secondary | ICD-10-CM

## 2022-10-05 DIAGNOSIS — M25561 Pain in right knee: Secondary | ICD-10-CM | POA: Diagnosis not present

## 2022-10-05 DIAGNOSIS — M6281 Muscle weakness (generalized): Secondary | ICD-10-CM

## 2022-10-05 DIAGNOSIS — M25661 Stiffness of right knee, not elsewhere classified: Secondary | ICD-10-CM

## 2022-10-05 DIAGNOSIS — G8929 Other chronic pain: Secondary | ICD-10-CM

## 2022-10-05 DIAGNOSIS — M25662 Stiffness of left knee, not elsewhere classified: Secondary | ICD-10-CM

## 2022-10-05 NOTE — Therapy (Signed)
OUTPATIENT PHYSICAL THERAPY TREATMENT   Patient Name: Jimmy [name] MRN: [id number] DOB:Jun 03, 1951, 72 y.o., male Today's Date: 10/05/2022  END OF SESSION:  PT End of Session - 10/05/22 0945     Visit Number 4    Number of Visits 12    Date for PT Re-Evaluation 11/03/22    Authorization Type Medicare + [employer]    Progress Note Due on Visit 10    PT Start Time 0934    PT Stop Time 1015    PT Time Calculation (min) 41 min    Activity Tolerance Patient tolerated treatment well    Behavior During Therapy [facility] for tasks assessed/performed             Past Medical History:  Diagnosis Date   BCC (basal cell carcinoma of skin) 2013   dr Allyson Sabal    CKD (chronic kidney disease), stage III ([place])    Diabetes mellitus    Hypertension    Obesity    Past Surgical History:  Procedure Laterality Date   pilonidal cyst surgery     POLYPECTOMY     Patient Active Problem List   Diagnosis Date Noted   PCP NOTES >>>> 07/11/2015   Dyspnea on exertion 04/03/2012   BCC (basal cell carcinoma of skin) 04/03/2012   Annual physical exam 01/13/2012   Edema 03/15/2011   Hyperlipidemia 03/15/2011   Constipation 03/15/2011   ERECTILE DYSFUNCTION, NON-ORGANIC 07/03/2010   CKD (chronic kidney disease), stage III ([place]) 06/18/2008   DM II (diabetes mellitus, type II), controlled ([place]) 11/09/2006   Morbid obesity ([place]) 11/09/2006   HTN (hypertension) 11/09/2006    PCP: Colon Branch, MD  REFERRING PROVIDER: Gregor Hams, MD   REFERRING DIAG:  M25.561,M25.562,G89.29 (ICD-10-CM) - Chronic pain of both knees  M17.0 (ICD-10-CM) - Primary osteoarthritis of both knees    THERAPY DIAG:  Chronic pain of right knee  Chronic pain of left knee  Stiffness of left knee, not elsewhere classified  Difficulty in walking, not elsewhere classified  Stiffness of right knee, not elsewhere classified  Muscle weakness (generalized)  Rationale for Evaluation and Treatment:  Rehabilitation  ONSET DATE: chronic  SUBJECTIVE:   SUBJECTIVE STATEMENT: Patient got his knee braces, reports able to do everything pretty much with knee braces now (except swim).  He went swimming on Saturday, pleased that he seems stronger.  Did new exercises yesterday.  Braces are giving more support, but still pop and grind occasionally.       PERTINENT HISTORY: DM PAIN:  Are you having pain? Yes: NPRS scale: 1 /10 Pain location: R knee Pain description: normal ache 1, but intermittent sharp shooting pain is 8/10, feels like knee cap out of place, doesn't trust knee Aggravating factors: moving knee wrong, doesn't trust going up stairs Relieving factors: stretching   Are you having pain? Yes: NPRS scale: 1/10 Pain location: L knee  Pain description: ache Aggravating factors: exercise Relieving factors: stretching  PRECAUTIONS: None  WEIGHT BEARING RESTRICTIONS: No  FALLS:  Has patient fallen in last 6 months? No  LIVING ENVIRONMENT: Lives with: lives with their spouse Lives in: House/apartment Stairs: Yes: Internal: 14 steps; on right going up Has following equipment at home:  exercise bike, exercise step, hand weights, elastic bands, balance pad  OCCUPATION: retired   PLOF: Independent  PATIENT GOALS: be able to walk up stairs with alternating feet and no handrail - if I can do that the rest will take care of itself.   NEXT  MD VISIT:  11/09/22  OBJECTIVE:   DIAGNOSTIC FINDINGS: 08/31/2022 DG R knee FINDINGS: Tricompartmental degenerative changes are noted worst in the medial joint space. No fracture is noted. No sizable joint effusion is seen. Multiple calcified loose bodies are noted. No other soft tissue abnormality is seen. 08/31/2022   L knee FINDINGS: Tricompartmental degenerative changes are noted worst in the medial joint space. No joint effusion is seen. No acute fracture or dislocation is noted. Small calcified loose bodies are noted.  PATIENT SURVEYS:   LEFS 42/80  COGNITION: Overall cognitive status: Within functional limits for tasks assessed     SENSATION: Some neuropathy in feet   MUSCLE LENGTH: Hamstrings: moderate tightness bil Right 60 deg; Left 60 deg   POSTURE: forward head  PALPATION: Tenderness over bil MCL, R LCL  LOWER EXTREMITY ROM:  Active ROM Right eval Left eval  Knee flexion 120 115  Knee extension 8 7   (Blank rows = not tested)  LOWER EXTREMITY MMT:  MMT Right eval Left eval  Hip flexion 4+ 4+  Hip extension 5 5  Hip abduction 5 5  Hip adduction 5 5  Knee flexion 5 5  Knee extension 5 5  Ankle dorsiflexion 5 5  Ankle plantarflexion 5 5   (Blank rows = not tested)  LOWER EXTREMITY SPECIAL TESTS:  All ligaments intact bil knees, no concerns for meniscus.     FUNCTIONAL TESTS:  5 times sit to stand: 9.6 sec without UE assist, table height 20 inches.   GAIT: Distance walked: 29' Assistive device utilized: None Level of assistance: Complete Independence Comments: antalgic/Trendelenburg gait, step to gait with HR ascending, step to gait with HR descending with body angled sideways.    TODAY'S TREATMENT:                                                                                                                              DATE:  10/05/22 Therapeutic Exercise: to improve strength and mobility.  Demo, verbal and tactile cues throughout for technique. Treadmill x 6 min at 1.0 mph Step ups (no riser) 2 x 10 bil - still starting to grind/pop even with braces Side step up - increased grinding/popping, discontinued Hip hiking x 10 bil - very difficult Prone leg extensions  2 x 10 bil    10/01/22 Therapeutic Exercise: to improve strength and mobility.  Demo, verbal and tactile cues throughout for technique. Nustep L6 x 6 min  Straight leg raise 2# 2 x 10 bil  S/L hip abduction  2 x 10 bil (weight removed due to torque on knee and catching) S/L clamshell 2x 10 bil  Prone hip extension 2 x  10 bil 2#  Bridges with ball squeeze 2 x 10 bil  Hamstring stretch supine with strap 2 x 1 min bil  Seated Hamstring stretch x 30 sec bil - difficulty straightening knee  09/28/22 Therapeutic Exercise: to improve strength and mobility.  Demo, verbal and  tactile cues throughout for technique. Bike L2 x 6 min  Step ups - x 10 on LLE 4" step, x 10 on 2" step on RLE - noted increased crepitus and grinding in R knee even with step lowered.  Forward T's - counter support x 10 bil  Wall squats 5 x 15 sec hold Toe raises while leaning against wall x 10 Heel raises x 15  Straight leg raise 2 x 10 bil  Bridges 3 x 10  S/L clams 2 x 10 bil    09/15/2022 Therapeutic Exercise: to improve strength and mobility.  Demo, verbal and tactile cues throughout for technique. - Standing Hip Abduction with Resistance at Ankles and Counter Support  GTB 10 reps - Standing Hip Extension with Resistance at Ankles and Unilateral Counter Support GTB  10 reps - Standing Hip Flexion with Resistance at Ankles and Unilateral Counter Support  -GTB 10 reps - Squat with Chair and Counter Support  - 10 reps   PATIENT EDUCATION:  Education details: HEP update Person educated: Patient Education method: Explanation, Demonstration, Verbal cues, and Handouts Education comprehension: verbalized understanding and returned demonstration  HOME EXERCISE PROGRAM: Access Code: [email or web address] URL: [email or web address] Date: 10/01/2022 Prepared by: Glenetta Hew  Exercises  Added  - Prone Hip Extension  - 1 x daily - 7 x weekly - 2-3 sets - 10 reps - Seated Hamstring Stretch with Chair  - 1 x daily - 7 x weekly - 3 sets - 10 reps - Supine Hamstring Stretch with Strap  - 1 x daily - 7 x weekly - 3 sets - 10 reps   ASSESSMENT:  CLINICAL IMPRESSION: Jimmy [name] received bil [employer] braces on Friday.  Today performed exercises with braces, but noted continued to have popping/grinding and feeling joint out of place  especially on steps.  Continued to work on hip strengthening, very challenged today with hip hike/lowering due to limited pelvic mobility.  Otherwise demonstrating improving strength.   Jimmy [name] continues to demonstrate potential for improvement and would benefit from continued skilled therapy to address impairments.     OBJECTIVE IMPAIRMENTS: Abnormal gait, decreased activity tolerance, difficulty walking, decreased ROM, decreased strength, hypomobility, impaired flexibility, and pain.   ACTIVITY LIMITATIONS: bending, standing, squatting, stairs, transfers, and locomotion level  PARTICIPATION LIMITATIONS: community activity  PERSONAL FACTORS: Fitness, Time since onset of injury/illness/exacerbation, and 3+ comorbidities: obesity, T2DM, CKD, HTN, osteoarthritis  are also affecting patient's functional outcome.   REHAB POTENTIAL: Good  CLINICAL DECISION MAKING: Evolving/moderate complexity  EVALUATION COMPLEXITY: Moderate   GOALS: Goals reviewed with patient? Yes  SHORT TERM GOALS: Target date: 09/29/2022   Patient will be independent with initial HEP. Baseline: given Goal status: IN PROGRESS 09/28/22- too busy on vacation to do, tried 1x.    LONG TERM GOALS: Target date: 11/03/2022   Patient will be independent with advanced/ongoing HEP to improve outcomes and carryover.  Baseline:  Goal status: IN PROGRESS  2.  Patient will report at least 75% improvement in R knee buckling/sharp pain to improve QOL. Baseline: intermittent but daily Goal status: IN PROGRESS  3.  Patient will be able to ascend/descend stairs with 1 HR and reciprocal step pattern safely to access home and community.  Baseline: ascends with step to gait and HR, descends step to gait slightly sideways and HR.  Goal status: IN PROGRESS  4.  Patient will report >50/80 on LEFS to demonstrate improved functional ability. Baseline: 42/80 = 52.5% ability  Goal status: IN PROGRESS  5.  Patient will demonstrate at  least >22/30 on FGA to decrease low risk of of falls. Baseline: NT Goal status: IN PROGRESS   6.  Patient will demonstrate improved hamstring flexibility with SLR to 75 deg to decrease muscle imbalance and improve knee extension.  Baseline: lacking full knee extension bil, moderate hamstring tightness ~ 60 deg bil.  Goal status: IN PROGRESS    PLAN:  PT FREQUENCY: 1-2x/week  PT DURATION: 7 weeks (missing first week due to vacation)  PLANNED INTERVENTIONS: Therapeutic exercises, Therapeutic activity, Neuromuscular re-education, Balance training, Gait training, Patient/Family education, Self Care, Joint mobilization, Stair training, Dry Needling, Electrical stimulation, Spinal mobilization, Cryotherapy, Moist heat, Taping, Ultrasound, Ionotophoresis '4mg'$ /ml Dexamethasone, Manual therapy, and Re-evaluation  PLAN FOR NEXT SESSION: focus on glute strengthening, stairs, eccentric strengthening.     Rennie Natter, PT, DPT  10/05/2022, 11:59 AM

## 2022-10-08 ENCOUNTER — Ambulatory Visit: Payer: Medicare Other

## 2022-10-08 DIAGNOSIS — M6281 Muscle weakness (generalized): Secondary | ICD-10-CM

## 2022-10-08 DIAGNOSIS — M25561 Pain in right knee: Secondary | ICD-10-CM | POA: Diagnosis not present

## 2022-10-08 DIAGNOSIS — G8929 Other chronic pain: Secondary | ICD-10-CM

## 2022-10-08 DIAGNOSIS — M25662 Stiffness of left knee, not elsewhere classified: Secondary | ICD-10-CM

## 2022-10-08 DIAGNOSIS — R262 Difficulty in walking, not elsewhere classified: Secondary | ICD-10-CM

## 2022-10-08 DIAGNOSIS — M25661 Stiffness of right knee, not elsewhere classified: Secondary | ICD-10-CM

## 2022-10-08 NOTE — Therapy (Signed)
OUTPATIENT PHYSICAL THERAPY TREATMENT   Patient Name: Jimmy [name] MRN: [id number] DOB:1951/04/23, 72 y.o., male Today's Date: 10/08/2022  END OF SESSION:  PT End of Session - 10/08/22 1017     Visit Number 5    Number of Visits 12    Date for PT Re-Evaluation 11/03/22    Authorization Type Medicare + [employer]    Progress Note Due on Visit 10    PT Start Time 0935    PT Stop Time 1015    PT Time Calculation (min) 40 min    Activity Tolerance Patient tolerated treatment well    Behavior During Therapy [facility] for tasks assessed/performed              Past Medical History:  Diagnosis Date   BCC (basal cell carcinoma of skin) 2013   dr Allyson Sabal    CKD (chronic kidney disease), stage III ([place])    Diabetes mellitus    Hypertension    Obesity    Past Surgical History:  Procedure Laterality Date   pilonidal cyst surgery     POLYPECTOMY     Patient Active Problem List   Diagnosis Date Noted   PCP NOTES >>>> 07/11/2015   Dyspnea on exertion 04/03/2012   BCC (basal cell carcinoma of skin) 04/03/2012   Annual physical exam 01/13/2012   Edema 03/15/2011   Hyperlipidemia 03/15/2011   Constipation 03/15/2011   ERECTILE DYSFUNCTION, NON-ORGANIC 07/03/2010   CKD (chronic kidney disease), stage III ([place]) 06/18/2008   DM II (diabetes mellitus, type II), controlled ([place]) 11/09/2006   Morbid obesity ([place]) 11/09/2006   HTN (hypertension) 11/09/2006    PCP: Colon Branch, MD  REFERRING PROVIDER: Gregor Hams, MD   REFERRING DIAG:  M25.561,M25.562,G89.29 (ICD-10-CM) - Chronic pain of both knees  M17.0 (ICD-10-CM) - Primary osteoarthritis of both knees    THERAPY DIAG:  Chronic pain of right knee  Chronic pain of left knee  Stiffness of left knee, not elsewhere classified  Difficulty in walking, not elsewhere classified  Stiffness of right knee, not elsewhere classified  Muscle weakness (generalized)  Rationale for Evaluation and Treatment:  Rehabilitation  ONSET DATE: chronic  SUBJECTIVE:   SUBJECTIVE STATEMENT: Knees are doing better, still noticing popping and cracking, also went swimming yesterday.     PERTINENT HISTORY: DM PAIN:  Are you having pain? Yes: NPRS scale: 0/10 Pain location: R knee Pain description: normal ache 1, but intermittent sharp shooting pain is 8/10, feels like knee cap out of place, doesn't trust knee Aggravating factors: moving knee wrong, doesn't trust going up stairs Relieving factors: stretching   Are you having pain? Yes: NPRS scale: 0/10 Pain location: L knee  Pain description: ache Aggravating factors: exercise Relieving factors: stretching  PRECAUTIONS: None  WEIGHT BEARING RESTRICTIONS: No  FALLS:  Has patient fallen in last 6 months? No  LIVING ENVIRONMENT: Lives with: lives with their spouse Lives in: House/apartment Stairs: Yes: Internal: 14 steps; on right going up Has following equipment at home:  exercise bike, exercise step, hand weights, elastic bands, balance pad  OCCUPATION: retired   PLOF: Independent  PATIENT GOALS: be able to walk up stairs with alternating feet and no handrail - if I can do that the rest will take care of itself.   NEXT MD VISIT:  11/09/22  OBJECTIVE:   DIAGNOSTIC FINDINGS: 08/31/2022 DG R knee FINDINGS: Tricompartmental degenerative changes are noted worst in the medial joint space. No fracture is noted. No sizable joint effusion is  seen. Multiple calcified loose bodies are noted. No other soft tissue abnormality is seen. 08/31/2022   L knee FINDINGS: Tricompartmental degenerative changes are noted worst in the medial joint space. No joint effusion is seen. No acute fracture or dislocation is noted. Small calcified loose bodies are noted.  PATIENT SURVEYS:  LEFS 42/80  COGNITION: Overall cognitive status: Within functional limits for tasks assessed     SENSATION: Some neuropathy in feet   MUSCLE LENGTH: Hamstrings: moderate  tightness bil Right 60 deg; Left 60 deg   POSTURE: forward head  PALPATION: Tenderness over bil MCL, R LCL  LOWER EXTREMITY ROM:  Active ROM Right eval Left eval  Knee flexion 120 115  Knee extension 8 7   (Blank rows = not tested)  LOWER EXTREMITY MMT:  MMT Right eval Left eval  Hip flexion 4+ 4+  Hip extension 5 5  Hip abduction 5 5  Hip adduction 5 5  Knee flexion 5 5  Knee extension 5 5  Ankle dorsiflexion 5 5  Ankle plantarflexion 5 5   (Blank rows = not tested)  LOWER EXTREMITY SPECIAL TESTS:  All ligaments intact bil knees, no concerns for meniscus.     FUNCTIONAL TESTS:  5 times sit to stand: 9.6 sec without UE assist, table height 20 inches.   GAIT: Distance walked: 36' Assistive device utilized: None Level of assistance: Complete Independence Comments: antalgic/Trendelenburg gait, step to gait with HR ascending, step to gait with HR descending with body angled sideways.    TODAY'S TREATMENT:                                                                                                                              DATE:   10/08/22 Therapeutic Exercise: to improve strength and mobility.  Demo, verbal and tactile cues throughout for technique. Treadmill x 6 min at 1.0 mph Step ups fwd and lateral x 10 R/L Sit to stand 2x10 Supine bridge 10x5" sec hold S/L clamshells GTB 10x R/L S/L hip abduction x 10 R/L  Stairs: 14 with 1HR, able to step up on R LE but uses momentum and also unsteady, unable to bend R knee and support on R knee descending  10/05/22 Therapeutic Exercise: to improve strength and mobility.  Demo, verbal and tactile cues throughout for technique. Treadmill x 6 min at 1.0 mph Step ups (no riser) 2 x 10 bil - still starting to grind/pop even with braces Side step up - increased grinding/popping, discontinued Hip hiking x 10 bil - very difficult Prone leg extensions  2 x 10 bil    10/01/22 Therapeutic Exercise: to improve strength and  mobility.  Demo, verbal and tactile cues throughout for technique. Nustep L6 x 6 min  Straight leg raise 2# 2 x 10 bil  S/L hip abduction  2 x 10 bil (weight removed due to torque on knee and catching) S/L clamshell 2x 10 bil  Prone hip extension 2 x  10 bil 2#  Bridges with ball squeeze 2 x 10 bil  Hamstring stretch supine with strap 2 x 1 min bil  Seated Hamstring stretch x 30 sec bil - difficulty straightening knee  09/28/22 Therapeutic Exercise: to improve strength and mobility.  Demo, verbal and tactile cues throughout for technique. Bike L2 x 6 min  Step ups - x 10 on LLE 4" step, x 10 on 2" step on RLE - noted increased crepitus and grinding in R knee even with step lowered.  Forward T's - counter support x 10 bil  Wall squats 5 x 15 sec hold Toe raises while leaning against wall x 10 Heel raises x 15  Straight leg raise 2 x 10 bil  Bridges 3 x 10  S/L clams 2 x 10 bil    09/15/2022 Therapeutic Exercise: to improve strength and mobility.  Demo, verbal and tactile cues throughout for technique. - Standing Hip Abduction with Resistance at Ankles and Counter Support  GTB 10 reps - Standing Hip Extension with Resistance at Ankles and Unilateral Counter Support GTB  10 reps - Standing Hip Flexion with Resistance at Ankles and Unilateral Counter Support  -GTB 10 reps - Squat with Chair and Counter Support  - 10 reps   PATIENT EDUCATION:  Education details: HEP update Person educated: Patient Education method: Explanation, Demonstration, Verbal cues, and Handouts Education comprehension: verbalized understanding and returned demonstration  HOME EXERCISE PROGRAM: Access Code: [email or web address] URL: [email or web address] Date: 10/01/2022 Prepared by: Glenetta Hew  Exercises  Added  - Prone Hip Extension  - 1 x daily - 7 x weekly - 2-3 sets - 10 reps - Seated Hamstring Stretch with Chair  - 1 x daily - 7 x weekly - 3 sets - 10 reps - Supine Hamstring Stretch with  Strap  - 1 x daily - 7 x weekly - 3 sets - 10 reps   ASSESSMENT:  CLINICAL IMPRESSION: Pt with no increased pain with interventions. Progressed to focus on stair training exercises as well as strengthening muscles needed to climb stairs. He was able to climb stairs with both LE but unsteady on R as well as not being able to bend and support on R knee descending stairs. Added GTB to HEP for clamshells.   OBJECTIVE IMPAIRMENTS: Abnormal gait, decreased activity tolerance, difficulty walking, decreased ROM, decreased strength, hypomobility, impaired flexibility, and pain.   ACTIVITY LIMITATIONS: bending, standing, squatting, stairs, transfers, and locomotion level  PARTICIPATION LIMITATIONS: community activity  PERSONAL FACTORS: Fitness, Time since onset of injury/illness/exacerbation, and 3+ comorbidities: obesity, T2DM, CKD, HTN, osteoarthritis  are also affecting patient's functional outcome.   REHAB POTENTIAL: Good  CLINICAL DECISION MAKING: Evolving/moderate complexity  EVALUATION COMPLEXITY: Moderate   GOALS: Goals reviewed with patient? Yes  SHORT TERM GOALS: Target date: 09/29/2022   Patient will be independent with initial HEP. Baseline: given Goal status: IN PROGRESS 09/28/22- too busy on vacation to do, tried 1x.    LONG TERM GOALS: Target date: 11/03/2022   Patient will be independent with advanced/ongoing HEP to improve outcomes and carryover.  Baseline:  Goal status: IN PROGRESS  2.  Patient will report at least 75% improvement in R knee buckling/sharp pain to improve QOL. Baseline: intermittent but daily Goal status: IN PROGRESS  3.  Patient will be able to ascend/descend stairs with 1 HR and reciprocal step pattern safely to access home and community.  Baseline: ascends with step to gait and HR, descends step to gait slightly sideways and HR.  Goal  status: IN PROGRESS  4.  Patient will report >50/80 on LEFS to demonstrate improved functional  ability. Baseline: 42/80 = 52.5% ability  Goal status: IN PROGRESS  5.  Patient will demonstrate at least >22/30 on FGA to decrease low risk of of falls. Baseline: NT Goal status: IN PROGRESS   6.  Patient will demonstrate improved hamstring flexibility with SLR to 75 deg to decrease muscle imbalance and improve knee extension.  Baseline: lacking full knee extension bil, moderate hamstring tightness ~ 60 deg bil.  Goal status: IN PROGRESS    PLAN:  PT FREQUENCY: 1-2x/week  PT DURATION: 7 weeks (missing first week due to vacation)  PLANNED INTERVENTIONS: Therapeutic exercises, Therapeutic activity, Neuromuscular re-education, Balance training, Gait training, Patient/Family education, Self Care, Joint mobilization, Stair training, Dry Needling, Electrical stimulation, Spinal mobilization, Cryotherapy, Moist heat, Taping, Ultrasound, Ionotophoresis 50m/ml Dexamethasone, Manual therapy, and Re-evaluation  PLAN FOR NEXT SESSION: focus on glute strengthening, stairs, eccentric strengthening.     BArtist Pais PTA 10/08/2022, 10:28 AM

## 2022-10-12 ENCOUNTER — Encounter: Payer: Self-pay | Admitting: Physical Therapy

## 2022-10-12 ENCOUNTER — Ambulatory Visit: Payer: Medicare Other | Admitting: Physical Therapy

## 2022-10-12 DIAGNOSIS — M25661 Stiffness of right knee, not elsewhere classified: Secondary | ICD-10-CM

## 2022-10-12 DIAGNOSIS — R262 Difficulty in walking, not elsewhere classified: Secondary | ICD-10-CM

## 2022-10-12 DIAGNOSIS — M6281 Muscle weakness (generalized): Secondary | ICD-10-CM

## 2022-10-12 DIAGNOSIS — M25662 Stiffness of left knee, not elsewhere classified: Secondary | ICD-10-CM

## 2022-10-12 DIAGNOSIS — M25561 Pain in right knee: Secondary | ICD-10-CM | POA: Diagnosis not present

## 2022-10-12 DIAGNOSIS — G8929 Other chronic pain: Secondary | ICD-10-CM

## 2022-10-12 NOTE — Therapy (Signed)
OUTPATIENT PHYSICAL THERAPY TREATMENT   Patient Name: Jimmy [name] MRN: [id number] DOB:Jan 21, 1951, 72 y.o., male Today's Date: 10/12/2022  END OF SESSION:  PT End of Session - 10/12/22 0934     Visit Number 6    Number of Visits 12    Date for PT Re-Evaluation 11/03/22    Authorization Type Medicare + [employer]    Progress Note Due on Visit 10    PT Start Time 0933    PT Stop Time 1018    PT Time Calculation (min) 45 min    Activity Tolerance Patient tolerated treatment well    Behavior During Therapy [facility] for tasks assessed/performed              Past Medical History:  Diagnosis Date   BCC (basal cell carcinoma of skin) 2013   dr Allyson Sabal    CKD (chronic kidney disease), stage III ([place])    Diabetes mellitus    Hypertension    Obesity    Past Surgical History:  Procedure Laterality Date   pilonidal cyst surgery     POLYPECTOMY     Patient Active Problem List   Diagnosis Date Noted   PCP NOTES >>>> 07/11/2015   Dyspnea on exertion 04/03/2012   BCC (basal cell carcinoma of skin) 04/03/2012   Annual physical exam 01/13/2012   Edema 03/15/2011   Hyperlipidemia 03/15/2011   Constipation 03/15/2011   ERECTILE DYSFUNCTION, NON-ORGANIC 07/03/2010   CKD (chronic kidney disease), stage III ([place]) 06/18/2008   DM II (diabetes mellitus, type II), controlled ([place]) 11/09/2006   Morbid obesity ([place]) 11/09/2006   HTN (hypertension) 11/09/2006    PCP: Colon Branch, MD  REFERRING PROVIDER: Gregor Hams, MD   REFERRING DIAG:  M25.561,M25.562,G89.29 (ICD-10-CM) - Chronic pain of both knees  M17.0 (ICD-10-CM) - Primary osteoarthritis of both knees    THERAPY DIAG:  Chronic pain of right knee  Chronic pain of left knee  Stiffness of left knee, not elsewhere classified  Difficulty in walking, not elsewhere classified  Stiffness of right knee, not elsewhere classified  Muscle weakness (generalized)  Rationale for Evaluation and Treatment:  Rehabilitation  ONSET DATE: chronic  SUBJECTIVE:   SUBJECTIVE STATEMENT: Reports that while knees pop and crack a lot, there is not nearly as much pain associated with it.  The hamstring stretches are difficult but feels are making the most difference.    PERTINENT HISTORY: DM PAIN:  Are you having pain? Yes: NPRS scale: 0/10 Pain location: R knee Pain description: normal ache 1, but intermittent sharp shooting pain is 8/10, feels like knee cap out of place, doesn't trust knee Aggravating factors: moving knee wrong, doesn't trust going up stairs Relieving factors: stretching   Are you having pain? Yes: NPRS scale: 0/10 Pain location: L knee  Pain description: ache Aggravating factors: exercise Relieving factors: stretching  PRECAUTIONS: None  WEIGHT BEARING RESTRICTIONS: No  FALLS:  Has patient fallen in last 6 months? No  LIVING ENVIRONMENT: Lives with: lives with their spouse Lives in: House/apartment Stairs: Yes: Internal: 14 steps; on right going up Has following equipment at home:  exercise bike, exercise step, hand weights, elastic bands, balance pad  OCCUPATION: retired   PLOF: Independent  PATIENT GOALS: be able to walk up stairs with alternating feet and no handrail - if I can do that the rest will take care of itself.   NEXT MD VISIT:  11/09/22  OBJECTIVE:   DIAGNOSTIC FINDINGS: 08/31/2022 DG R knee FINDINGS: Tricompartmental degenerative  changes are noted worst in the medial joint space. No fracture is noted. No sizable joint effusion is seen. Multiple calcified loose bodies are noted. No other soft tissue abnormality is seen. 08/31/2022   L knee FINDINGS: Tricompartmental degenerative changes are noted worst in the medial joint space. No joint effusion is seen. No acute fracture or dislocation is noted. Small calcified loose bodies are noted.  PATIENT SURVEYS:  LEFS 42/80  COGNITION: Overall cognitive status: Within functional limits for tasks  assessed     SENSATION: Some neuropathy in feet   MUSCLE LENGTH: Hamstrings: moderate tightness bil Right 60 deg; Left 60 deg   POSTURE: forward head  PALPATION: Tenderness over bil MCL, R LCL  LOWER EXTREMITY ROM:  Active ROM Right eval Left eval  Knee flexion 120 115  Knee extension 8 7   (Blank rows = not tested)  LOWER EXTREMITY MMT:  MMT Right eval Left eval  Hip flexion 4+ 4+  Hip extension 5 5  Hip abduction 5 5  Hip adduction 5 5  Knee flexion 5 5  Knee extension 5 5  Ankle dorsiflexion 5 5  Ankle plantarflexion 5 5   (Blank rows = not tested)  LOWER EXTREMITY SPECIAL TESTS:  All ligaments intact bil knees, no concerns for meniscus.     FUNCTIONAL TESTS:  5 times sit to stand: 9.6 sec without UE assist, table height 20 inches.   GAIT: Distance walked: 42' Assistive device utilized: None Level of assistance: Complete Independence Comments: antalgic/Trendelenburg gait, step to gait with HR ascending, step to gait with HR descending with body angled sideways.    TODAY'S TREATMENT:                                                                                                                              DATE:  10/12/22 Therapeutic Exercise: to improve strength and mobility.  Demo, verbal and tactile cues throughout for technique. Treadmill x 6 min at 1.0 mph Step ups forward (4" step) x 10 bil - focusing on extending through glutes Forward T's 2 x 10 bil  Heel taps forward 2 x 10 bil - 1 UE supprt Hip abduction - opposite foot on slider 2 x 10 bil with bil UE support Seated hamstring stretch - still painful so switched to seated quad sets for active hamstring stretch 10 x 5 sec hold - tolerated much better, added to HEP.  Standing quad stretch 5 x 5 bil.   Review and progression HEP  10/08/22 Therapeutic Exercise: to improve strength and mobility.  Demo, verbal and tactile cues throughout for technique. Treadmill x 6 min at 1.0 mph Step ups fwd and  lateral x 10 R/L Sit to stand 2x10 Supine bridge 10x5" sec hold S/L clamshells GTB 10x R/L S/L hip abduction x 10 R/L  Stairs: 14 with 1HR, able to step up on R LE but uses momentum and also unsteady, unable to bend R knee and support  on R knee descending  10/05/22 Therapeutic Exercise: to improve strength and mobility.  Demo, verbal and tactile cues throughout for technique. Treadmill x 6 min at 1.0 mph Step ups (no riser) 2 x 10 bil - still starting to grind/pop even with braces Side step up - increased grinding/popping, discontinued Hip hiking x 10 bil - very difficult Prone leg extensions  2 x 10 bil   PATIENT EDUCATION:  Education details: HEP update Person educated: Patient Education method: Explanation, Demonstration, Verbal cues, and Handouts Education comprehension: verbalized understanding and returned demonstration  HOME EXERCISE PROGRAM: Access Code: [email or web address] URL: [email or web address] Date: 10/12/2022 Prepared by: Glenetta Hew  Exercises Added   - Seated Quad Set  - 3 x daily - 7 x weekly - 1 sets - 10 reps - 5-10 sec hold - Standing Quad Set  - 3 x daily - 7 x weekly - 1 sets - 10 reps - 5 sec hold   ASSESSMENT:  CLINICAL IMPRESSION: Marshall Cork Slawinski arrived wearing bil [employer] braces again.  He reports decreasing pain and buckling in knees with activities.  Continues to focus on LE strengthening, especially glute strengthening, which he tolerated well.  Replaced seated hamstring stretch with quad set today, which he tolerated much better and reported decreased pain.  Noted improved bil knee extension with quad sets.  Marshall Cork Kama continues to demonstrate potential for improvement and would benefit from continued skilled therapy to address impairments.      OBJECTIVE IMPAIRMENTS: Abnormal gait, decreased activity tolerance, difficulty walking, decreased ROM, decreased strength, hypomobility, impaired flexibility, and pain.   ACTIVITY LIMITATIONS:  bending, standing, squatting, stairs, transfers, and locomotion level  PARTICIPATION LIMITATIONS: community activity  PERSONAL FACTORS: Fitness, Time since onset of injury/illness/exacerbation, and 3+ comorbidities: obesity, T2DM, CKD, HTN, osteoarthritis  are also affecting patient's functional outcome.   REHAB POTENTIAL: Good  CLINICAL DECISION MAKING: Evolving/moderate complexity  EVALUATION COMPLEXITY: Moderate   GOALS: Goals reviewed with patient? Yes  SHORT TERM GOALS: Target date: 09/29/2022   Patient will be independent with initial HEP. Baseline: given Goal status: MET 09/28/22- too busy on vacation to do, tried 1x.  10/12/22- compliant.    LONG TERM GOALS: Target date: 11/03/2022   Patient will be independent with advanced/ongoing HEP to improve outcomes and carryover.  Baseline:  Goal status: IN PROGRESS  2.  Patient will report at least 75% improvement in R knee buckling/sharp pain to improve QOL. Baseline: intermittent but daily Goal status: IN PROGRESS  3.  Patient will be able to ascend/descend stairs with 1 HR and reciprocal step pattern safely to access home and community.  Baseline: ascends with step to gait and HR, descends step to gait slightly sideways and HR.  Goal status: IN PROGRESS  4.  Patient will report >50/80 on LEFS to demonstrate improved functional ability. Baseline: 42/80 = 52.5% ability  Goal status: IN PROGRESS  5.  Patient will demonstrate at least >22/30 on FGA to decrease low risk of of falls. Baseline: NT Goal status: IN PROGRESS   6.  Patient will demonstrate improved hamstring flexibility with SLR to 75 deg to decrease muscle imbalance and improve knee extension.  Baseline: lacking full knee extension bil, moderate hamstring tightness ~ 60 deg bil.  Goal status: IN PROGRESS    PLAN:  PT FREQUENCY: 1-2x/week  PT DURATION: 7 weeks (missing first week due to vacation)  PLANNED INTERVENTIONS: Therapeutic exercises, Therapeutic  activity, Neuromuscular re-education, Balance training, Gait training, Patient/Family education, Self Care, Joint mobilization, Stair  training, Dry Needling, Electrical stimulation, Spinal mobilization, Cryotherapy, Moist heat, Taping, Ultrasound, Ionotophoresis 2m/ml Dexamethasone, Manual therapy, and Re-evaluation  PLAN FOR NEXT SESSION: focus on glute strengthening, stairs, eccentric strengthening.     ERennie Natter PT, DPT  10/12/2022, 12:11 PM

## 2022-10-15 ENCOUNTER — Ambulatory Visit: Payer: Medicare Other

## 2022-10-15 DIAGNOSIS — M6281 Muscle weakness (generalized): Secondary | ICD-10-CM

## 2022-10-15 DIAGNOSIS — M25561 Pain in right knee: Secondary | ICD-10-CM | POA: Diagnosis not present

## 2022-10-15 DIAGNOSIS — M25662 Stiffness of left knee, not elsewhere classified: Secondary | ICD-10-CM

## 2022-10-15 DIAGNOSIS — G8929 Other chronic pain: Secondary | ICD-10-CM

## 2022-10-15 DIAGNOSIS — M25661 Stiffness of right knee, not elsewhere classified: Secondary | ICD-10-CM

## 2022-10-15 DIAGNOSIS — R262 Difficulty in walking, not elsewhere classified: Secondary | ICD-10-CM

## 2022-10-15 NOTE — Therapy (Signed)
OUTPATIENT PHYSICAL THERAPY TREATMENT   Patient Name: Jimmy [name] MRN: [id number] DOB:01-03-1951, 72 y.o., male Today's Date: 10/15/2022  END OF SESSION:  PT End of Session - 10/15/22 1020     Visit Number 7    Number of Visits 12    Date for PT Re-Evaluation 11/03/22    Authorization Type Medicare + [employer]    Progress Note Due on Visit 10    PT Start Time 0934    PT Stop Time 1014    PT Time Calculation (min) 40 min    Activity Tolerance Patient tolerated treatment well    Behavior During Therapy [facility] for tasks assessed/performed               Past Medical History:  Diagnosis Date   BCC (basal cell carcinoma of skin) 2013   dr Allyson Sabal    CKD (chronic kidney disease), stage III ([place])    Diabetes mellitus    Hypertension    Obesity    Past Surgical History:  Procedure Laterality Date   pilonidal cyst surgery     POLYPECTOMY     Patient Active Problem List   Diagnosis Date Noted   PCP NOTES >>>> 07/11/2015   Dyspnea on exertion 04/03/2012   BCC (basal cell carcinoma of skin) 04/03/2012   Annual physical exam 01/13/2012   Edema 03/15/2011   Hyperlipidemia 03/15/2011   Constipation 03/15/2011   ERECTILE DYSFUNCTION, NON-ORGANIC 07/03/2010   CKD (chronic kidney disease), stage III ([place]) 06/18/2008   DM II (diabetes mellitus, type II), controlled ([place]) 11/09/2006   Morbid obesity ([place]) 11/09/2006   HTN (hypertension) 11/09/2006    PCP: Colon Branch, MD  REFERRING PROVIDER: Gregor Hams, MD   REFERRING DIAG:  M25.561,M25.562,G89.29 (ICD-10-CM) - Chronic pain of both knees  M17.0 (ICD-10-CM) - Primary osteoarthritis of both knees    THERAPY DIAG:  Chronic pain of right knee  Chronic pain of left knee  Stiffness of left knee, not elsewhere classified  Difficulty in walking, not elsewhere classified  Stiffness of right knee, not elsewhere classified  Muscle weakness (generalized)  Rationale for Evaluation and Treatment:  Rehabilitation  ONSET DATE: chronic  SUBJECTIVE:   SUBJECTIVE STATEMENT: Pt reports aching in knees but nothing really to complain about.    PERTINENT HISTORY: DM PAIN:  Are you having pain? Yes: NPRS scale: 0/10 Pain location: R knee Pain description: normal ache 1, but intermittent sharp shooting pain is 8/10, feels like knee cap out of place, doesn't trust knee Aggravating factors: moving knee wrong, doesn't trust going up stairs Relieving factors: stretching   Are you having pain? Yes: NPRS scale: 0/10 Pain location: L knee  Pain description: ache Aggravating factors: exercise Relieving factors: stretching  PRECAUTIONS: None  WEIGHT BEARING RESTRICTIONS: No  FALLS:  Has patient fallen in last 6 months? No  LIVING ENVIRONMENT: Lives with: lives with their spouse Lives in: House/apartment Stairs: Yes: Internal: 14 steps; on right going up Has following equipment at home:  exercise bike, exercise step, hand weights, elastic bands, balance pad  OCCUPATION: retired   PLOF: Independent  PATIENT GOALS: be able to walk up stairs with alternating feet and no handrail - if I can do that the rest will take care of itself.   NEXT MD VISIT:  11/09/22  OBJECTIVE:   DIAGNOSTIC FINDINGS: 08/31/2022 DG R knee FINDINGS: Tricompartmental degenerative changes are noted worst in the medial joint space. No fracture is noted. No sizable joint effusion is seen. Multiple  calcified loose bodies are noted. No other soft tissue abnormality is seen. 08/31/2022   L knee FINDINGS: Tricompartmental degenerative changes are noted worst in the medial joint space. No joint effusion is seen. No acute fracture or dislocation is noted. Small calcified loose bodies are noted.  PATIENT SURVEYS:  LEFS 42/80  COGNITION: Overall cognitive status: Within functional limits for tasks assessed     SENSATION: Some neuropathy in feet   MUSCLE LENGTH: Hamstrings: moderate tightness bil Right 60 deg;  Left 60 deg   POSTURE: forward head  PALPATION: Tenderness over bil MCL, R LCL  LOWER EXTREMITY ROM:  Active ROM Right eval Left eval  Knee flexion 120 115  Knee extension 8 7   (Blank rows = not tested)  LOWER EXTREMITY MMT:  MMT Right eval Left eval  Hip flexion 4+ 4+  Hip extension 5 5  Hip abduction 5 5  Hip adduction 5 5  Knee flexion 5 5  Knee extension 5 5  Ankle dorsiflexion 5 5  Ankle plantarflexion 5 5   (Blank rows = not tested)  LOWER EXTREMITY SPECIAL TESTS:  All ligaments intact bil knees, no concerns for meniscus.     FUNCTIONAL TESTS:  5 times sit to stand: 9.6 sec without UE assist, table height 20 inches.   GAIT: Distance walked: 58' Assistive device utilized: None Level of assistance: Complete Independence Comments: antalgic/Trendelenburg gait, step to gait with HR ascending, step to gait with HR descending with body angled sideways.    TODAY'S TREATMENT:                                                                                                                              DATE:  10/15/22 Therapeutic Exercise: to improve strength and mobility.  Demo, verbal and tactile cues throughout for technique. Treadmill x 6 min at 1.0 mph Fwd step up focusing on glute recruitment x 10 Hip hike on step RLE x 10 Supine R quad stretch 2x30 sec Supine SLR bil x 10 2# Supine SLR with ER bil x 10 2# Supine hip ADD ball squeeze 10x5" Supine ball squeeze with LAQ 10x each LE Quad sets 10x5"  10/12/22 Therapeutic Exercise: to improve strength and mobility.  Demo, verbal and tactile cues throughout for technique. Treadmill x 6 min at 1.0 mph Step ups forward (4" step) x 10 bil - focusing on extending through glutes Forward T's 2 x 10 bil  Heel taps forward 2 x 10 bil - 1 UE supprt Hip abduction - opposite foot on slider 2 x 10 bil with bil UE support Seated hamstring stretch - still painful so switched to seated quad sets for active hamstring stretch  10 x 5 sec hold - tolerated much better, added to HEP.  Standing quad stretch 5 x 5 bil.   Review and progression HEP  10/08/22 Therapeutic Exercise: to improve strength and mobility.  Demo, verbal and tactile cues throughout for technique. Treadmill x  6 min at 1.0 mph Step ups fwd and lateral x 10 R/L Sit to stand 2x10 Supine bridge 10x5" sec hold S/L clamshells GTB 10x R/L S/L hip abduction x 10 R/L  Stairs: 14 with 1HR, able to step up on R LE but uses momentum and also unsteady, unable to bend R knee and support on R knee descending  10/05/22 Therapeutic Exercise: to improve strength and mobility.  Demo, verbal and tactile cues throughout for technique. Treadmill x 6 min at 1.0 mph Step ups (no riser) 2 x 10 bil - still starting to grind/pop even with braces Side step up - increased grinding/popping, discontinued Hip hiking x 10 bil - very difficult Prone leg extensions  2 x 10 bil   PATIENT EDUCATION:  Education details: HEP update Person educated: Patient Education method: Explanation, Demonstration, Verbal cues, and Handouts Education comprehension: verbalized understanding and returned demonstration  HOME EXERCISE PROGRAM: Access Code: [email or web address] URL: [email or web address] Date: 10/12/2022 Prepared by: Glenetta Hew  Exercises Added   - Seated Quad Set  - 3 x daily - 7 x weekly - 1 sets - 10 reps - 5-10 sec hold - Standing Quad Set  - 3 x daily - 7 x weekly - 1 sets - 10 reps - 5 sec hold   ASSESSMENT:  CLINICAL IMPRESSION:  Today we progressed knee strengthening to patient's tolerance. He still gets crepitus in his R knee when doing steps but no pain with it. Cues required as needed with exercises but no issues with completing interventions. Incorporated more ways for him to use glutes with step ups rather than knees to prevent strain on knees. He would continue to benefit from skilled therapy.    OBJECTIVE IMPAIRMENTS: Abnormal gait, decreased  activity tolerance, difficulty walking, decreased ROM, decreased strength, hypomobility, impaired flexibility, and pain.   ACTIVITY LIMITATIONS: bending, standing, squatting, stairs, transfers, and locomotion level  PARTICIPATION LIMITATIONS: community activity  PERSONAL FACTORS: Fitness, Time since onset of injury/illness/exacerbation, and 3+ comorbidities: obesity, T2DM, CKD, HTN, osteoarthritis  are also affecting patient's functional outcome.   REHAB POTENTIAL: Good  CLINICAL DECISION MAKING: Evolving/moderate complexity  EVALUATION COMPLEXITY: Moderate   GOALS: Goals reviewed with patient? Yes  SHORT TERM GOALS: Target date: 09/29/2022   Patient will be independent with initial HEP. Baseline: given Goal status: MET 09/28/22- too busy on vacation to do, tried 1x.  10/12/22- compliant.    LONG TERM GOALS: Target date: 11/03/2022   Patient will be independent with advanced/ongoing HEP to improve outcomes and carryover.  Baseline:  Goal status: IN PROGRESS  2.  Patient will report at least 75% improvement in R knee buckling/sharp pain to improve QOL. Baseline: intermittent but daily Goal status: IN PROGRESS  3.  Patient will be able to ascend/descend stairs with 1 HR and reciprocal step pattern safely to access home and community.  Baseline: ascends with step to gait and HR, descends step to gait slightly sideways and HR.  Goal status: IN PROGRESS  4.  Patient will report >50/80 on LEFS to demonstrate improved functional ability. Baseline: 42/80 = 52.5% ability  Goal status: IN PROGRESS  5.  Patient will demonstrate at least >22/30 on FGA to decrease low risk of of falls. Baseline: NT Goal status: IN PROGRESS   6.  Patient will demonstrate improved hamstring flexibility with SLR to 75 deg to decrease muscle imbalance and improve knee extension.  Baseline: lacking full knee extension bil, moderate hamstring tightness ~ 60 deg bil.  Goal status: IN  PROGRESS  PLAN:  PT FREQUENCY: 1-2x/week  PT DURATION: 7 weeks (missing first week due to vacation)  PLANNED INTERVENTIONS: Therapeutic exercises, Therapeutic activity, Neuromuscular re-education, Balance training, Gait training, Patient/Family education, Self Care, Joint mobilization, Stair training, Dry Needling, Electrical stimulation, Spinal mobilization, Cryotherapy, Moist heat, Taping, Ultrasound, Ionotophoresis 76m/ml Dexamethasone, Manual therapy, and Re-evaluation  PLAN FOR NEXT SESSION: focus on glute strengthening, stairs, eccentric strengthening.     BArtist Pais PTA 10/15/2022, 10:20 AM

## 2022-10-19 ENCOUNTER — Ambulatory Visit: Payer: Medicare Other

## 2022-10-19 DIAGNOSIS — M25661 Stiffness of right knee, not elsewhere classified: Secondary | ICD-10-CM

## 2022-10-19 DIAGNOSIS — R262 Difficulty in walking, not elsewhere classified: Secondary | ICD-10-CM

## 2022-10-19 DIAGNOSIS — M25662 Stiffness of left knee, not elsewhere classified: Secondary | ICD-10-CM

## 2022-10-19 DIAGNOSIS — G8929 Other chronic pain: Secondary | ICD-10-CM

## 2022-10-19 DIAGNOSIS — M6281 Muscle weakness (generalized): Secondary | ICD-10-CM

## 2022-10-19 DIAGNOSIS — M25561 Pain in right knee: Secondary | ICD-10-CM | POA: Diagnosis not present

## 2022-10-19 NOTE — Therapy (Signed)
OUTPATIENT PHYSICAL THERAPY TREATMENT   Patient Name: Jimmy [name] MRN: [id number] DOB:1951-05-06, 72 y.o., male Today's Date: 10/19/2022  END OF SESSION:  PT End of Session - 10/19/22 1012     Visit Number 8    Number of Visits 12    Date for PT Re-Evaluation 11/03/22    Authorization Type Medicare + [employer]    Progress Note Due on Visit 10    PT Start Time 0932    PT Stop Time 1015    PT Time Calculation (min) 43 min    Activity Tolerance Patient tolerated treatment well    Behavior During Therapy [facility] for tasks assessed/performed                Past Medical History:  Diagnosis Date   BCC (basal cell carcinoma of skin) 2013   dr Allyson Sabal    CKD (chronic kidney disease), stage III ([place])    Diabetes mellitus    Hypertension    Obesity    Past Surgical History:  Procedure Laterality Date   pilonidal cyst surgery     POLYPECTOMY     Patient Active Problem List   Diagnosis Date Noted   PCP NOTES >>>> 07/11/2015   Dyspnea on exertion 04/03/2012   BCC (basal cell carcinoma of skin) 04/03/2012   Annual physical exam 01/13/2012   Edema 03/15/2011   Hyperlipidemia 03/15/2011   Constipation 03/15/2011   ERECTILE DYSFUNCTION, NON-ORGANIC 07/03/2010   CKD (chronic kidney disease), stage III ([place]) 06/18/2008   DM II (diabetes mellitus, type II), controlled ([place]) 11/09/2006   Morbid obesity ([place]) 11/09/2006   HTN (hypertension) 11/09/2006    PCP: Colon Branch, MD  REFERRING PROVIDER: Gregor Hams, MD   REFERRING DIAG:  M25.561,M25.562,G89.29 (ICD-10-CM) - Chronic pain of both knees  M17.0 (ICD-10-CM) - Primary osteoarthritis of both knees    THERAPY DIAG:  Chronic pain of right knee  Chronic pain of left knee  Stiffness of left knee, not elsewhere classified  Difficulty in walking, not elsewhere classified  Stiffness of right knee, not elsewhere classified  Muscle weakness (generalized)  Rationale for Evaluation and Treatment:  Rehabilitation  ONSET DATE: chronic  SUBJECTIVE:   SUBJECTIVE STATEMENT: Pt reports no pain but notes a lot of snapping and cracking   PERTINENT HISTORY: DM PAIN:  Are you having pain? Yes: NPRS scale: 0/10 Pain location: R knee Pain description: normal ache 1, but intermittent sharp shooting pain is 8/10, feels like knee cap out of place, doesn't trust knee Aggravating factors: moving knee wrong, doesn't trust going up stairs Relieving factors: stretching   Are you having pain? Yes: NPRS scale: 0/10 Pain location: L knee  Pain description: ache Aggravating factors: exercise Relieving factors: stretching  PRECAUTIONS: None  WEIGHT BEARING RESTRICTIONS: No  FALLS:  Has patient fallen in last 6 months? No  LIVING ENVIRONMENT: Lives with: lives with their spouse Lives in: House/apartment Stairs: Yes: Internal: 14 steps; on right going up Has following equipment at home:  exercise bike, exercise step, hand weights, elastic bands, balance pad  OCCUPATION: retired   PLOF: Independent  PATIENT GOALS: be able to walk up stairs with alternating feet and no handrail - if I can do that the rest will take care of itself.   NEXT MD VISIT:  11/09/22  OBJECTIVE:   DIAGNOSTIC FINDINGS: 08/31/2022 DG R knee FINDINGS: Tricompartmental degenerative changes are noted worst in the medial joint space. No fracture is noted. No sizable joint effusion is seen.  Multiple calcified loose bodies are noted. No other soft tissue abnormality is seen. 08/31/2022   L knee FINDINGS: Tricompartmental degenerative changes are noted worst in the medial joint space. No joint effusion is seen. No acute fracture or dislocation is noted. Small calcified loose bodies are noted.  PATIENT SURVEYS:  LEFS 42/80  COGNITION: Overall cognitive status: Within functional limits for tasks assessed     SENSATION: Some neuropathy in feet   MUSCLE LENGTH: Hamstrings: moderate tightness bil Right 60 deg; Left  60 deg   POSTURE: forward head  PALPATION: Tenderness over bil MCL, R LCL  LOWER EXTREMITY ROM:  Active ROM Right eval Left eval  Knee flexion 120 115  Knee extension 8 7   (Blank rows = not tested)  LOWER EXTREMITY MMT:  MMT Right eval Left eval  Hip flexion 4+ 4+  Hip extension 5 5  Hip abduction 5 5  Hip adduction 5 5  Knee flexion 5 5  Knee extension 5 5  Ankle dorsiflexion 5 5  Ankle plantarflexion 5 5   (Blank rows = not tested)  LOWER EXTREMITY SPECIAL TESTS:  All ligaments intact bil knees, no concerns for meniscus.     FUNCTIONAL TESTS:  5 times sit to stand: 9.6 sec without UE assist, table height 20 inches.   GAIT: Distance walked: 32' Assistive device utilized: None Level of assistance: Complete Independence Comments: antalgic/Trendelenburg gait, step to gait with HR ascending, step to gait with HR descending with body angled sideways.    TODAY'S TREATMENT:                                                                                                                              DATE:  10/19/22 Therapeutic Exercise: to improve strength and mobility.  Demo, verbal and tactile cues throughout for technique. Treadmill x 8 min at 1.0 mph Hip hike on step 6' x 10  Standing hip abduction 2# 2x10 Standing hip extension 2# 2x10 Standing HS curls 2# x 10 Seated LAQ with vmo ball squeeze x 15 Seated ball squeezes x 15  10/15/22 Therapeutic Exercise: to improve strength and mobility.  Demo, verbal and tactile cues throughout for technique. Treadmill x 6 min at 1.0 mph Fwd step up focusing on glute recruitment x 10 Hip hike on step RLE x 10 Supine R quad stretch 2x30 sec Supine SLR bil x 10 2# Supine SLR with ER bil x 10 2# Supine hip ADD ball squeeze 10x5" Supine ball squeeze with LAQ 10x each LE Quad sets 10x5"  10/12/22 Therapeutic Exercise: to improve strength and mobility.  Demo, verbal and tactile cues throughout for technique. Treadmill x 6  min at 1.0 mph Step ups forward (4" step) x 10 bil - focusing on extending through glutes Forward T's 2 x 10 bil  Heel taps forward 2 x 10 bil - 1 UE supprt Hip abduction - opposite foot on slider 2 x 10 bil with bil UE  support Seated hamstring stretch - still painful so switched to seated quad sets for active hamstring stretch 10 x 5 sec hold - tolerated much better, added to HEP.  Standing quad stretch 5 x 5 bil.   Review and progression HEP  10/08/22 Therapeutic Exercise: to improve strength and mobility.  Demo, verbal and tactile cues throughout for technique. Treadmill x 6 min at 1.0 mph Step ups fwd and lateral x 10 R/L Sit to stand 2x10 Supine bridge 10x5" sec hold S/L clamshells GTB 10x R/L S/L hip abduction x 10 R/L  Stairs: 14 with 1HR, able to step up on R LE but uses momentum and also unsteady, unable to bend R knee and support on R knee descending  10/05/22 Therapeutic Exercise: to improve strength and mobility.  Demo, verbal and tactile cues throughout for technique. Treadmill x 6 min at 1.0 mph Step ups (no riser) 2 x 10 bil - still starting to grind/pop even with braces Side step up - increased grinding/popping, discontinued Hip hiking x 10 bil - very difficult Prone leg extensions  2 x 10 bil   PATIENT EDUCATION:  Education details: HEP update Person educated: Patient Education method: Explanation, Demonstration, Verbal cues, and Handouts Education comprehension: verbalized understanding and returned demonstration  HOME EXERCISE PROGRAM: Access Code: [email or web address] URL: [email or web address] Date: 10/12/2022 Prepared by: Glenetta Hew  Exercises Added   - Seated Quad Set  - 3 x daily - 7 x weekly - 1 sets - 10 reps - 5-10 sec hold - Standing Quad Set  - 3 x daily - 7 x weekly - 1 sets - 10 reps - 5 sec hold   ASSESSMENT:  CLINICAL IMPRESSION: Pt showed good response to treatment. He continues having crepitus in both knees but not having any  pain with it. We focused on hip strengthening for proximal stability and in weight bearing for functional strengthening. Continued with VMO exercises to improve recruitment of medial knee muscles. Instructed to try more hip exercises in WB to get most benefit.    OBJECTIVE IMPAIRMENTS: Abnormal gait, decreased activity tolerance, difficulty walking, decreased ROM, decreased strength, hypomobility, impaired flexibility, and pain.   ACTIVITY LIMITATIONS: bending, standing, squatting, stairs, transfers, and locomotion level  PARTICIPATION LIMITATIONS: community activity  PERSONAL FACTORS: Fitness, Time since onset of injury/illness/exacerbation, and 3+ comorbidities: obesity, T2DM, CKD, HTN, osteoarthritis  are also affecting patient's functional outcome.   REHAB POTENTIAL: Good  CLINICAL DECISION MAKING: Evolving/moderate complexity  EVALUATION COMPLEXITY: Moderate   GOALS: Goals reviewed with patient? Yes  SHORT TERM GOALS: Target date: 09/29/2022   Patient will be independent with initial HEP. Baseline: given Goal status: MET 09/28/22- too busy on vacation to do, tried 1x.  10/12/22- compliant.    LONG TERM GOALS: Target date: 11/03/2022   Patient will be independent with advanced/ongoing HEP to improve outcomes and carryover.  Baseline:  Goal status: IN PROGRESS  2.  Patient will report at least 75% improvement in R knee buckling/sharp pain to improve QOL. Baseline: intermittent but daily Goal status: IN PROGRESS  3.  Patient will be able to ascend/descend stairs with 1 HR and reciprocal step pattern safely to access home and community.  Baseline: ascends with step to gait and HR, descends step to gait slightly sideways and HR.  Goal status: IN PROGRESS  4.  Patient will report >50/80 on LEFS to demonstrate improved functional ability. Baseline: 42/80 = 52.5% ability  Goal status: IN PROGRESS  5.  Patient will demonstrate at least >22/30 on  FGA to decrease low risk of of  falls. Baseline: NT Goal status: IN PROGRESS   6.  Patient will demonstrate improved hamstring flexibility with SLR to 75 deg to decrease muscle imbalance and improve knee extension.  Baseline: lacking full knee extension bil, moderate hamstring tightness ~ 60 deg bil.  Goal status: IN PROGRESS    PLAN:  PT FREQUENCY: 1-2x/week  PT DURATION: 7 weeks (missing first week due to vacation)  PLANNED INTERVENTIONS: Therapeutic exercises, Therapeutic activity, Neuromuscular re-education, Balance training, Gait training, Patient/Family education, Self Care, Joint mobilization, Stair training, Dry Needling, Electrical stimulation, Spinal mobilization, Cryotherapy, Moist heat, Taping, Ultrasound, Ionotophoresis 77m/ml Dexamethasone, Manual therapy, and Re-evaluation  PLAN FOR NEXT SESSION: print VMO exercises; focus on glute strengthening, stairs, eccentric strengthening    BArtist Pais PTA 10/19/2022, 10:22 AM

## 2022-10-22 ENCOUNTER — Ambulatory Visit: Payer: Medicare Other

## 2022-10-22 DIAGNOSIS — M25661 Stiffness of right knee, not elsewhere classified: Secondary | ICD-10-CM

## 2022-10-22 DIAGNOSIS — M6281 Muscle weakness (generalized): Secondary | ICD-10-CM

## 2022-10-22 DIAGNOSIS — R262 Difficulty in walking, not elsewhere classified: Secondary | ICD-10-CM

## 2022-10-22 DIAGNOSIS — M25662 Stiffness of left knee, not elsewhere classified: Secondary | ICD-10-CM

## 2022-10-22 DIAGNOSIS — G8929 Other chronic pain: Secondary | ICD-10-CM

## 2022-10-22 DIAGNOSIS — M25561 Pain in right knee: Secondary | ICD-10-CM | POA: Diagnosis not present

## 2022-10-22 NOTE — Therapy (Signed)
OUTPATIENT PHYSICAL THERAPY TREATMENT   Patient Name: Jimmy [name] MRN: [id number] DOB:May 29, 1951, 72 y.o., male Today's Date: 10/22/2022  END OF SESSION:  PT End of Session - 10/22/22 1021     Visit Number 9    Number of Visits 12    Date for PT Re-Evaluation 11/03/22    Authorization Type Medicare + [employer]    Progress Note Due on Visit 10    PT Start Time 0935    PT Stop Time 1015    PT Time Calculation (min) 40 min    Activity Tolerance Patient tolerated treatment well    Behavior During Therapy [facility] for tasks assessed/performed                Past Medical History:  Diagnosis Date   BCC (basal cell carcinoma of skin) 2013   dr Allyson Sabal    CKD (chronic kidney disease), stage III ([place])    Diabetes mellitus    Hypertension    Obesity    Past Surgical History:  Procedure Laterality Date   pilonidal cyst surgery     POLYPECTOMY     Patient Active Problem List   Diagnosis Date Noted   PCP NOTES >>>> 07/11/2015   Dyspnea on exertion 04/03/2012   BCC (basal cell carcinoma of skin) 04/03/2012   Annual physical exam 01/13/2012   Edema 03/15/2011   Hyperlipidemia 03/15/2011   Constipation 03/15/2011   ERECTILE DYSFUNCTION, NON-ORGANIC 07/03/2010   CKD (chronic kidney disease), stage III ([place]) 06/18/2008   DM II (diabetes mellitus, type II), controlled ([place]) 11/09/2006   Morbid obesity ([place]) 11/09/2006   HTN (hypertension) 11/09/2006    PCP: Colon Branch, MD  REFERRING PROVIDER: Gregor Hams, MD   REFERRING DIAG:  M25.561,M25.562,G89.29 (ICD-10-CM) - Chronic pain of both knees  M17.0 (ICD-10-CM) - Primary osteoarthritis of both knees    THERAPY DIAG:  Chronic pain of right knee  Chronic pain of left knee  Stiffness of left knee, not elsewhere classified  Difficulty in walking, not elsewhere classified  Stiffness of right knee, not elsewhere classified  Muscle weakness (generalized)  Rationale for Evaluation and Treatment:  Rehabilitation  ONSET DATE: chronic  SUBJECTIVE:   SUBJECTIVE STATEMENT: Pt just reports soreness in R glutes and quads.    PERTINENT HISTORY: DM PAIN:  Are you having pain? Yes: NPRS scale: 0/10 Pain location: R knee Pain description: normal ache 1, but intermittent sharp shooting pain is 8/10, feels like knee cap out of place, doesn't trust knee Aggravating factors: moving knee wrong, doesn't trust going up stairs Relieving factors: stretching   Are you having pain? Yes: NPRS scale: 0/10 Pain location: L knee  Pain description: ache Aggravating factors: exercise Relieving factors: stretching  PRECAUTIONS: None  WEIGHT BEARING RESTRICTIONS: No  FALLS:  Has patient fallen in last 6 months? No  LIVING ENVIRONMENT: Lives with: lives with their spouse Lives in: House/apartment Stairs: Yes: Internal: 14 steps; on right going up Has following equipment at home:  exercise bike, exercise step, hand weights, elastic bands, balance pad  OCCUPATION: retired   PLOF: Independent  PATIENT GOALS: be able to walk up stairs with alternating feet and no handrail - if I can do that the rest will take care of itself.   NEXT MD VISIT:  11/09/22  OBJECTIVE:   DIAGNOSTIC FINDINGS: 08/31/2022 DG R knee FINDINGS: Tricompartmental degenerative changes are noted worst in the medial joint space. No fracture is noted. No sizable joint effusion is seen. Multiple calcified  loose bodies are noted. No other soft tissue abnormality is seen. 08/31/2022   L knee FINDINGS: Tricompartmental degenerative changes are noted worst in the medial joint space. No joint effusion is seen. No acute fracture or dislocation is noted. Small calcified loose bodies are noted.  PATIENT SURVEYS:  LEFS 42/80  COGNITION: Overall cognitive status: Within functional limits for tasks assessed     SENSATION: Some neuropathy in feet   MUSCLE LENGTH: Hamstrings: moderate tightness bil Right 60 deg; Left 60  deg   POSTURE: forward head  PALPATION: Tenderness over bil MCL, R LCL  LOWER EXTREMITY ROM:  Active ROM Right eval Left eval  Knee flexion 120 115  Knee extension 8 7   (Blank rows = not tested)  LOWER EXTREMITY MMT:  MMT Right eval Left eval  Hip flexion 4+ 4+  Hip extension 5 5  Hip abduction 5 5  Hip adduction 5 5  Knee flexion 5 5  Knee extension 5 5  Ankle dorsiflexion 5 5  Ankle plantarflexion 5 5   (Blank rows = not tested)  LOWER EXTREMITY SPECIAL TESTS:  All ligaments intact bil knees, no concerns for meniscus.     FUNCTIONAL TESTS:  5 times sit to stand: 9.6 sec without UE assist, table height 20 inches.   GAIT: Distance walked: 83' Assistive device utilized: None Level of assistance: Complete Independence Comments: antalgic/Trendelenburg gait, step to gait with HR ascending, step to gait with HR descending with body angled sideways.    TODAY'S TREATMENT:                                                                                                                              DATE:  10/22/22 Therapeutic Exercise: to improve strength and mobility.  Demo, verbal and tactile cues throughout for technique. Treadmill x 8 min at 1.0 mph Stair x 14 with 1HR on R side Seated hip ADD ball squeeze  Seated LAQ with ball sueeze Mini squat with ball squeeze 10x5" Knee flexion 35# 2x10 BLE; 15# RLE 2x10   10/19/22 Therapeutic Exercise: to improve strength and mobility.  Demo, verbal and tactile cues throughout for technique. Treadmill x 8 min at 1.0 mph Hip hike on step 6' x 10  Standing hip abduction 2# 2x10 Standing hip extension 2# 2x10 Standing HS curls 2# x 10 Seated LAQ with vmo ball squeeze x 15 Seated ball squeezes x 15  10/15/22 Therapeutic Exercise: to improve strength and mobility.  Demo, verbal and tactile cues throughout for technique. Treadmill x 6 min at 1.0 mph Fwd step up focusing on glute recruitment x 10 Hip hike on step RLE x  10 Supine R quad stretch 2x30 sec Supine SLR bil x 10 2# Supine SLR with ER bil x 10 2# Supine hip ADD ball squeeze 10x5" Supine ball squeeze with LAQ 10x each LE Quad sets 10x5"  10/12/22 Therapeutic Exercise: to improve strength and mobility.  Demo, verbal and tactile  cues throughout for technique. Treadmill x 6 min at 1.0 mph Step ups forward (4" step) x 10 bil - focusing on extending through glutes Forward T's 2 x 10 bil  Heel taps forward 2 x 10 bil - 1 UE supprt Hip abduction - opposite foot on slider 2 x 10 bil with bil UE support Seated hamstring stretch - still painful so switched to seated quad sets for active hamstring stretch 10 x 5 sec hold - tolerated much better, added to HEP.  Standing quad stretch 5 x 5 bil.   Review and progression HEP  10/08/22 Therapeutic Exercise: to improve strength and mobility.  Demo, verbal and tactile cues throughout for technique. Treadmill x 6 min at 1.0 mph Step ups fwd and lateral x 10 R/L Sit to stand 2x10 Supine bridge 10x5" sec hold S/L clamshells GTB 10x R/L S/L hip abduction x 10 R/L  Stairs: 14 with 1HR, able to step up on R LE but uses momentum and also unsteady, unable to bend R knee and support on R knee descending  10/05/22 Therapeutic Exercise: to improve strength and mobility.  Demo, verbal and tactile cues throughout for technique. Treadmill x 6 min at 1.0 mph Step ups (no riser) 2 x 10 bil - still starting to grind/pop even with braces Side step up - increased grinding/popping, discontinued Hip hiking x 10 bil - very difficult Prone leg extensions  2 x 10 bil   PATIENT EDUCATION:  Education details: HEP update Person educated: Patient Education method: Explanation, Demonstration, Verbal cues, and Handouts Education comprehension: verbalized understanding and returned demonstration  HOME EXERCISE PROGRAM: Access Code: [email or web address] URL: [email or web address] Date: 10/12/2022 Prepared by: Glenetta Hew  Exercises Added   - Seated Quad Set  - 3 x daily - 7 x weekly - 1 sets - 10 reps - 5-10 sec hold - Standing Quad Set  - 3 x daily - 7 x weekly - 1 sets - 10 reps - 5 sec hold   ASSESSMENT:  CLINICAL IMPRESSION: Pt continues to respond well to the progression of exercises. Assessed stairs today, patient showed inability to step onto step with RLE w/o 2 hand assist, naturally he does a step to pattern stepping up with LLE and down with RLE. Continued working on VMO exercises and knee strengthening to improve strength/stability in both knees.     OBJECTIVE IMPAIRMENTS: Abnormal gait, decreased activity tolerance, difficulty walking, decreased ROM, decreased strength, hypomobility, impaired flexibility, and pain.   ACTIVITY LIMITATIONS: bending, standing, squatting, stairs, transfers, and locomotion level  PARTICIPATION LIMITATIONS: community activity  PERSONAL FACTORS: Fitness, Time since onset of injury/illness/exacerbation, and 3+ comorbidities: obesity, T2DM, CKD, HTN, osteoarthritis  are also affecting patient's functional outcome.   REHAB POTENTIAL: Good  CLINICAL DECISION MAKING: Evolving/moderate complexity  EVALUATION COMPLEXITY: Moderate   GOALS: Goals reviewed with patient? Yes  SHORT TERM GOALS: Target date: 09/29/2022   Patient will be independent with initial HEP. Baseline: given Goal status: MET 09/28/22- too busy on vacation to do, tried 1x.  10/12/22- compliant.    LONG TERM GOALS: Target date: 11/03/2022   Patient will be independent with advanced/ongoing HEP to improve outcomes and carryover.  Baseline:  Goal status: IN PROGRESS  2.  Patient will report at least 75% improvement in R knee buckling/sharp pain to improve QOL. Baseline: intermittent but daily Goal status: IN PROGRESS  3.  Patient will be able to ascend/descend stairs with 1 HR and reciprocal step pattern safely to access home and community.  Baseline: ascends with step to gait and  HR, descends step to gait slightly sideways and HR.  Goal status: IN PROGRESS - 10/22/22  4.  Patient will report >50/80 on LEFS to demonstrate improved functional ability. Baseline: 42/80 = 52.5% ability  Goal status: IN PROGRESS  5.  Patient will demonstrate at least >22/30 on FGA to decrease low risk of of falls. Baseline: NT Goal status: IN PROGRESS   6.  Patient will demonstrate improved hamstring flexibility with SLR to 75 deg to decrease muscle imbalance and improve knee extension.  Baseline: lacking full knee extension bil, moderate hamstring tightness ~ 60 deg bil.  Goal status: IN PROGRESS    PLAN:  PT FREQUENCY: 1-2x/week  PT DURATION: 7 weeks (missing first week due to vacation)  PLANNED INTERVENTIONS: Therapeutic exercises, Therapeutic activity, Neuromuscular re-education, Balance training, Gait training, Patient/Family education, Self Care, Joint mobilization, Stair training, Dry Needling, Electrical stimulation, Spinal mobilization, Cryotherapy, Moist heat, Taping, Ultrasound, Ionotophoresis '4mg'$ /ml Dexamethasone, Manual therapy, and Re-evaluation  PLAN FOR NEXT SESSION: focus on glute strengthening, stairs, eccentric strengthening    Artist Pais, PTA 10/22/2022, 10:25 AM

## 2022-10-26 ENCOUNTER — Ambulatory Visit: Payer: Medicare Other

## 2022-10-26 DIAGNOSIS — M6281 Muscle weakness (generalized): Secondary | ICD-10-CM

## 2022-10-26 DIAGNOSIS — G8929 Other chronic pain: Secondary | ICD-10-CM

## 2022-10-26 DIAGNOSIS — M25661 Stiffness of right knee, not elsewhere classified: Secondary | ICD-10-CM

## 2022-10-26 DIAGNOSIS — M25662 Stiffness of left knee, not elsewhere classified: Secondary | ICD-10-CM

## 2022-10-26 DIAGNOSIS — R262 Difficulty in walking, not elsewhere classified: Secondary | ICD-10-CM

## 2022-10-26 DIAGNOSIS — M25561 Pain in right knee: Secondary | ICD-10-CM | POA: Diagnosis not present

## 2022-10-26 NOTE — Therapy (Addendum)
OUTPATIENT PHYSICAL THERAPY TREATMENT Progress Note Reporting Period 09/15/22 to 10/26/22  See note below for Objective Data and Assessment of Progress/Goals.   I have reviewed progress and goals and current plan of care is appropriate.   Rennie Natter, PT, DPT 10:59 AM 10/26/2022     Patient Name: Jimmy [name] MRN: [id number] DOB:1951-05-27, 72 y.o., male Today's Date: 10/26/2022  END OF SESSION:  PT End of Session - 10/26/22 1040     Visit Number 10    Number of Visits 12    Date for PT Re-Evaluation 11/03/22    Authorization Type Medicare + [employer]    Progress Note Due on Visit 10    PT Start Time 0935    PT Stop Time 1025    PT Time Calculation (min) 50 min    Activity Tolerance Patient tolerated treatment well    Behavior During Therapy [facility] for tasks assessed/performed                 Past Medical History:  Diagnosis Date   BCC (basal cell carcinoma of skin) 2013   dr Allyson Sabal    CKD (chronic kidney disease), stage III ([place])    Diabetes mellitus    Hypertension    Obesity    Past Surgical History:  Procedure Laterality Date   pilonidal cyst surgery     POLYPECTOMY     Patient Active Problem List   Diagnosis Date Noted   PCP NOTES >>>> 07/11/2015   Dyspnea on exertion 04/03/2012   BCC (basal cell carcinoma of skin) 04/03/2012   Annual physical exam 01/13/2012   Edema 03/15/2011   Hyperlipidemia 03/15/2011   Constipation 03/15/2011   ERECTILE DYSFUNCTION, NON-ORGANIC 07/03/2010   CKD (chronic kidney disease), stage III ([place]) 06/18/2008   DM II (diabetes mellitus, type II), controlled ([place]) 11/09/2006   Morbid obesity ([place]) 11/09/2006   HTN (hypertension) 11/09/2006    PCP: Colon Branch, MD  REFERRING PROVIDER: Gregor Hams, MD   REFERRING DIAG:  M25.561,M25.562,G89.29 (ICD-10-CM) - Chronic pain of both knees  M17.0 (ICD-10-CM) - Primary osteoarthritis of both knees    THERAPY DIAG:  Chronic pain of right knee  Chronic pain  of left knee  Stiffness of left knee, not elsewhere classified  Difficulty in walking, not elsewhere classified  Stiffness of right knee, not elsewhere classified  Muscle weakness (generalized)  Rationale for Evaluation and Treatment: Rehabilitation  ONSET DATE: chronic  SUBJECTIVE:   SUBJECTIVE STATEMENT: Pt notes improvement in walking and exercise, really likes the ball exercises.   PERTINENT HISTORY: DM PAIN:  Are you having pain? Yes: NPRS scale: 0/10 Pain location: R knee Pain description: normal ache 1, but intermittent sharp shooting pain is 8/10, feels like knee cap out of place, doesn't trust knee Aggravating factors: moving knee wrong, doesn't trust going up stairs Relieving factors: stretching   Are you having pain? Yes: NPRS scale: 0/10 Pain location: L knee  Pain description: ache Aggravating factors: exercise Relieving factors: stretching  PRECAUTIONS: None  WEIGHT BEARING RESTRICTIONS: No  FALLS:  Has patient fallen in last 6 months? No  LIVING ENVIRONMENT: Lives with: lives with their spouse Lives in: House/apartment Stairs: Yes: Internal: 14 steps; on right going up Has following equipment at home:  exercise bike, exercise step, hand weights, elastic bands, balance pad  OCCUPATION: retired   PLOF: Independent  PATIENT GOALS: be able to walk up stairs with alternating feet and no handrail - if I can do that  the rest will take care of itself.   NEXT MD VISIT:  11/09/22  OBJECTIVE:   DIAGNOSTIC FINDINGS: 08/31/2022 DG R knee FINDINGS: Tricompartmental degenerative changes are noted worst in the medial joint space. No fracture is noted. No sizable joint effusion is seen. Multiple calcified loose bodies are noted. No other soft tissue abnormality is seen. 08/31/2022   L knee FINDINGS: Tricompartmental degenerative changes are noted worst in the medial joint space. No joint effusion is seen. No acute fracture or dislocation is noted. Small  calcified loose bodies are noted.  PATIENT SURVEYS:  LEFS 42/80  COGNITION: Overall cognitive status: Within functional limits for tasks assessed     SENSATION: Some neuropathy in feet   MUSCLE LENGTH: Hamstrings: moderate tightness bil Right 60 deg; Left 60 deg   POSTURE: forward head  PALPATION: Tenderness over bil MCL, R LCL  LOWER EXTREMITY ROM:  Active ROM Right eval Left eval Right 10/26/22 Left 10/26/22  Knee flexion 120 115 120 121  Knee extension '8 7 3 2   '$ (Blank rows = not tested)  LOWER EXTREMITY MMT:  MMT Right eval Left eval  Hip flexion 4+ 4+  Hip extension 5 5  Hip abduction 5 5  Hip adduction 5 5  Knee flexion 5 5  Knee extension 5 5  Ankle dorsiflexion 5 5  Ankle plantarflexion 5 5   (Blank rows = not tested)  LOWER EXTREMITY SPECIAL TESTS:  All ligaments intact bil knees, no concerns for meniscus.     FUNCTIONAL TESTS:  5 times sit to stand: 9.6 sec without UE assist, table height 20 inches.   GAIT: Distance walked: 55' Assistive device utilized: None Level of assistance: Complete Independence Comments: antalgic/Trendelenburg gait, step to gait with HR ascending, step to gait with HR descending with body angled sideways.    TODAY'S TREATMENT:                                                                                                                              DATE:  10/26/22 Therapeutic Exercise: to improve strength and mobility.  Demo, verbal and tactile cues throughout for technique. Treadmill x 8 min at 1.0 mph Mini wall squat with VMO ball squeeze 10x5" Wall squats x 10  Single leg RDL x 10 bil Knee flexion 35# BLE 2x10; 10# RLE 2x10  Seated LAQ with VMO ball squeeze x 10 S/L hip adduction x 10 bil   Therapeutic Activity: to improve functional performance. Lower Extremity Functional Score: 57 / 80 = 71.3 % Knee ROM   10/22/22 Therapeutic Exercise: to improve strength and mobility.  Demo, verbal and tactile cues  throughout for technique. Treadmill x 8 min at 1.0 mph Stair x 14 with 1HR on R side Seated hip ADD ball squeeze  Seated LAQ with ball sueeze Mini squat with ball squeeze 10x5" Knee flexion 35# 2x10 BLE; 15# RLE 2x10   10/19/22 Therapeutic Exercise: to improve strength and mobility.  Demo, verbal and tactile cues throughout for technique. Treadmill x 8 min at 1.0 mph Hip hike on step 6' x 10  Standing hip abduction 2# 2x10 Standing hip extension 2# 2x10 Standing HS curls 2# x 10 Seated LAQ with vmo ball squeeze x 15 Seated ball squeezes x 15   PATIENT EDUCATION:  Education details: HEP update Person educated: Patient Education method: Explanation, Demonstration, Verbal cues, and Handouts Education comprehension: verbalized understanding and returned demonstration  HOME EXERCISE PROGRAM: Access Code: [email or web address] URL: [email or web address] Date: 10/12/2022 Prepared by: Glenetta Hew  Exercises Added   - Seated Quad Set  - 3 x daily - 7 x weekly - 1 sets - 10 reps - 5-10 sec hold - Standing Quad Set  - 3 x daily - 7 x weekly - 1 sets - 10 reps - 5 sec hold   ASSESSMENT:  CLINICAL IMPRESSION: Patient has seen improvement in walking tolerance, exercise tolerance, and less pain and buckling in R knee. He reports 50% improvement in R knee sharp pain and buckling. He scored 19 points higher on the LEFS, scoring 57/80 meeting LTG #4. He shows improved knee AROM on both sides. Patient now is able to do stairs step to pattern without having to sidestep, however unable to do reciprocal pattern stepping up without a lot of compensation. Continued to review exercises that he could do at home or at gym to focus on glutes/quad strengthening. He is progressing toward goals and met LTG #4 today. Pt would continue to benefit from skilled PT to address remaining deficits in knee strength, stability, and pain to improve function.     OBJECTIVE IMPAIRMENTS: Abnormal gait,  decreased activity tolerance, difficulty walking, decreased ROM, decreased strength, hypomobility, impaired flexibility, and pain.   ACTIVITY LIMITATIONS: bending, standing, squatting, stairs, transfers, and locomotion level  PARTICIPATION LIMITATIONS: community activity  PERSONAL FACTORS: Fitness, Time since onset of injury/illness/exacerbation, and 3+ comorbidities: obesity, T2DM, CKD, HTN, osteoarthritis  are also affecting patient's functional outcome.   REHAB POTENTIAL: Good  CLINICAL DECISION MAKING: Evolving/moderate complexity  EVALUATION COMPLEXITY: Moderate   GOALS: Goals reviewed with patient? Yes  SHORT TERM GOALS: Target date: 09/29/2022   Patient will be independent with initial HEP. Baseline: given Goal status: MET 09/28/22- too busy on vacation to do, tried 1x.  10/12/22- compliant.    LONG TERM GOALS: Target date: 11/03/2022   Patient will be independent with advanced/ongoing HEP to improve outcomes and carryover.  Baseline:  Goal status: IN PROGRESS  2.  Patient will report at least 75% improvement in R knee buckling/sharp pain to improve QOL. Baseline: intermittent but daily Goal status: IN PROGRESS - 10/26/22 50% improvement   3.  Patient will be able to ascend/descend stairs with 1 HR and reciprocal step pattern safely to access home and community.  Baseline: ascends with step to gait and HR, descends step to gait slightly sideways and HR.  Goal status: IN PROGRESS - 10/26/22  4.  Patient will report >50/80 on LEFS to demonstrate improved functional ability. Baseline: 42/80 = 52.5% ability  Goal status: MET- 10/26/22 57 / 80 = 71.3 %  5.  Patient will demonstrate at least >22/30 on FGA to decrease low risk of of falls. Baseline: NT Goal status: IN PROGRESS   6.  Patient will demonstrate improved hamstring flexibility with SLR to 75 deg to decrease muscle imbalance and improve knee extension.  Baseline: lacking full knee extension bil, moderate hamstring  tightness ~ 60 deg bil.  Goal status: IN  PROGRESS    PLAN:  PT FREQUENCY: 1-2x/week  PT DURATION: 7 weeks (missing first week due to vacation)  PLANNED INTERVENTIONS: Therapeutic exercises, Therapeutic activity, Neuromuscular re-education, Balance training, Gait training, Patient/Family education, Self Care, Joint mobilization, Stair training, Dry Needling, Electrical stimulation, Spinal mobilization, Cryotherapy, Moist heat, Taping, Ultrasound, Ionotophoresis '4mg'$ /ml Dexamethasone, Manual therapy, and Re-evaluation  PLAN FOR NEXT SESSION: FGA and measure hamstring flexibility; focus on glute strengthening, stairs, eccentric strengthening    Shekera Beavers L Carlis Abbott, PTA 10/26/2022, 10:50 AM

## 2022-10-29 ENCOUNTER — Ambulatory Visit: Payer: Medicare Other | Attending: Family Medicine

## 2022-10-29 DIAGNOSIS — G8929 Other chronic pain: Secondary | ICD-10-CM | POA: Diagnosis present

## 2022-10-29 DIAGNOSIS — M25562 Pain in left knee: Secondary | ICD-10-CM | POA: Diagnosis present

## 2022-10-29 DIAGNOSIS — M25561 Pain in right knee: Secondary | ICD-10-CM | POA: Insufficient documentation

## 2022-10-29 DIAGNOSIS — M6281 Muscle weakness (generalized): Secondary | ICD-10-CM | POA: Diagnosis present

## 2022-10-29 DIAGNOSIS — M25661 Stiffness of right knee, not elsewhere classified: Secondary | ICD-10-CM | POA: Insufficient documentation

## 2022-10-29 DIAGNOSIS — R262 Difficulty in walking, not elsewhere classified: Secondary | ICD-10-CM | POA: Insufficient documentation

## 2022-10-29 DIAGNOSIS — M25662 Stiffness of left knee, not elsewhere classified: Secondary | ICD-10-CM | POA: Insufficient documentation

## 2022-10-29 NOTE — Therapy (Signed)
OUTPATIENT PHYSICAL THERAPY TREATMENT      Patient Name: Jimmy [name] MRN: [id number] DOB:[birth year], 72 y.o., male Today's Date: 10/29/2022  END OF SESSION:  PT End of Session - 10/29/22 1017     Visit Number 11    Number of Visits 12    Date for PT Re-Evaluation 11/03/22    Authorization Type Medicare + [employer]    Progress Note Due on Visit 10    PT Start Time 0932    PT Stop Time 1016    PT Time Calculation (min) 44 min    Activity Tolerance Patient tolerated treatment well    Behavior During Therapy [facility] for tasks assessed/performed                  Past Medical History:  Diagnosis Date   BCC (basal cell carcinoma of skin) 2013   dr Allyson Sabal    CKD (chronic kidney disease), stage III ([place])    Diabetes mellitus    Hypertension    Obesity    Past Surgical History:  Procedure Laterality Date   pilonidal cyst surgery     POLYPECTOMY     Patient Active Problem List   Diagnosis Date Noted   PCP NOTES >>>> 07/11/2015   Dyspnea on exertion 04/03/2012   BCC (basal cell carcinoma of skin) 04/03/2012   Annual physical exam 01/13/2012   Edema 03/15/2011   Hyperlipidemia 03/15/2011   Constipation 03/15/2011   ERECTILE DYSFUNCTION, NON-ORGANIC 07/03/2010   CKD (chronic kidney disease), stage III ([place]) 06/18/2008   DM II (diabetes mellitus, type II), controlled ([place]) 11/09/2006   Morbid obesity ([place]) 11/09/2006   HTN (hypertension) 11/09/2006    PCP: Colon Branch, MD  REFERRING PROVIDER: Gregor Hams, MD   REFERRING DIAG:  M25.561,M25.562,G89.29 (ICD-10-CM) - Chronic pain of both knees  M17.0 (ICD-10-CM) - Primary osteoarthritis of both knees    THERAPY DIAG:  Chronic pain of right knee  Chronic pain of left knee  Stiffness of left knee, not elsewhere classified  Difficulty in walking, not elsewhere classified  Stiffness of right knee, not elsewhere classified  Muscle weakness (generalized)  Rationale for Evaluation and Treatment:  Rehabilitation  ONSET DATE: chronic  SUBJECTIVE:   SUBJECTIVE STATEMENT: No new complaints.   PERTINENT HISTORY: DM PAIN:  Are you having pain? Yes: NPRS scale: 0/10 Pain location: R knee Pain description: normal ache 1, but intermittent sharp shooting pain is 8/10, feels like knee cap out of place, doesn't trust knee Aggravating factors: moving knee wrong, doesn't trust going up stairs Relieving factors: stretching   Are you having pain? Yes: NPRS scale: 0/10 Pain location: L knee  Pain description: ache Aggravating factors: exercise Relieving factors: stretching  PRECAUTIONS: None  WEIGHT BEARING RESTRICTIONS: No  FALLS:  Has patient fallen in last 6 months? No  LIVING ENVIRONMENT: Lives with: lives with their spouse Lives in: House/apartment Stairs: Yes: Internal: 14 steps; on right going up Has following equipment at home:  exercise bike, exercise step, hand weights, elastic bands, balance pad  OCCUPATION: retired   PLOF: Independent  PATIENT GOALS: be able to walk up stairs with alternating feet and no handrail - if I can do that the rest will take care of itself.   NEXT MD VISIT:  11/09/22  OBJECTIVE:   DIAGNOSTIC FINDINGS: 08/31/2022 DG R knee FINDINGS: Tricompartmental degenerative changes are noted worst in the medial joint space. No fracture is noted. No sizable joint effusion is seen. Multiple calcified loose bodies  are noted. No other soft tissue abnormality is seen. 08/31/2022   L knee FINDINGS: Tricompartmental degenerative changes are noted worst in the medial joint space. No joint effusion is seen. No acute fracture or dislocation is noted. Small calcified loose bodies are noted.  PATIENT SURVEYS:  LEFS 42/80  COGNITION: Overall cognitive status: Within functional limits for tasks assessed     SENSATION: Some neuropathy in feet   MUSCLE LENGTH: Hamstrings: moderate tightness bil Right 60 deg; Left 60 deg 75 deg both sides  (10/29/22)   POSTURE: forward head  PALPATION: Tenderness over bil MCL, R LCL  LOWER EXTREMITY ROM:  Active ROM Right eval Left eval Right 10/26/22 Left 10/26/22  Knee flexion 120 115 120 121  Knee extension '8 7 3 2   '$ (Blank rows = not tested)  LOWER EXTREMITY MMT:  MMT Right eval Left eval  Hip flexion 4+ 4+  Hip extension 5 5  Hip abduction 5 5  Hip adduction 5 5  Knee flexion 5 5  Knee extension 5 5  Ankle dorsiflexion 5 5  Ankle plantarflexion 5 5   (Blank rows = not tested)  LOWER EXTREMITY SPECIAL TESTS:  All ligaments intact bil knees, no concerns for meniscus.     FUNCTIONAL TESTS:  5 times sit to stand: 9.6 sec without UE assist, table height 20 inches.   GAIT: Distance walked: 36' Assistive device utilized: None Level of assistance: Complete Independence Comments: antalgic/Trendelenburg gait, step to gait with HR ascending, step to gait with HR descending with body angled sideways.    TODAY'S TREATMENT:                                                                                                                              DATE:  10/29/22 FGA for warm up: Functional Gait Assessment: 23 / 30 = 76.7 % Hamstring flexibility measured Supine SLR 2x10 bil Supine bridge with ball squeeze 2x10 Wall squat 10x5" with ball squeeze Side lunge reaching to ankle x 5 R  10/26/22 Therapeutic Exercise: to improve strength and mobility.  Demo, verbal and tactile cues throughout for technique. Treadmill x 8 min at 1.0 mph Mini wall squat with VMO ball squeeze 10x5" Wall squats x 10  Single leg RDL x 10 bil Knee flexion 35# BLE 2x10; 10# RLE 2x10  Seated LAQ with VMO ball squeeze x 10 S/L hip adduction x 10 bil   Therapeutic Activity: to improve functional performance. Lower Extremity Functional Score: 57 / 80 = 71.3 % Knee ROM   10/22/22 Therapeutic Exercise: to improve strength and mobility.  Demo, verbal and tactile cues throughout for technique. Treadmill  x 8 min at 1.0 mph Stair x 14 with 1HR on R side Seated hip ADD ball squeeze  Seated LAQ with ball sueeze Mini squat with ball squeeze 10x5" Knee flexion 35# 2x10 BLE; 15# RLE 2x10   10/19/22 Therapeutic Exercise: to improve strength and mobility.  Demo, verbal and  tactile cues throughout for technique. Treadmill x 8 min at 1.0 mph Hip hike on step 6' x 10  Standing hip abduction 2# 2x10 Standing hip extension 2# 2x10 Standing HS curls 2# x 10 Seated LAQ with vmo ball squeeze x 15 Seated ball squeezes x 15   PATIENT EDUCATION:  Education details: HEP update Person educated: Patient Education method: Explanation, Demonstration, Verbal cues, and Handouts Education comprehension: verbalized understanding and returned demonstration  HOME EXERCISE PROGRAM: Access Code: [email or web address] URL: [email or web address] Date: 10/12/2022 Prepared by: Glenetta Hew  Exercises Added   - Seated Quad Set  - 3 x daily - 7 x weekly - 1 sets - 10 reps - 5-10 sec hold - Standing Quad Set  - 3 x daily - 7 x weekly - 1 sets - 10 reps - 5 sec hold   ASSESSMENT:  CLINICAL IMPRESSION: Pt has scored 23/30 on FGA, meeting LTG #5. He also shows 15 deg improvement in hamstring flexibility meeting LTG #6. Continued with working on strengthening for B knee/hips. He did have difficulty with the side lunges on R side. Cues with the SLR to keep ROM going against gravity. Pt is ok with doing 30 day hold after next visit.    OBJECTIVE IMPAIRMENTS: Abnormal gait, decreased activity tolerance, difficulty walking, decreased ROM, decreased strength, hypomobility, impaired flexibility, and pain.   ACTIVITY LIMITATIONS: bending, standing, squatting, stairs, transfers, and locomotion level  PARTICIPATION LIMITATIONS: community activity  PERSONAL FACTORS: Fitness, Time since onset of injury/illness/exacerbation, and 3+ comorbidities: obesity, T2DM, CKD, HTN, osteoarthritis  are also affecting patient's  functional outcome.   REHAB POTENTIAL: Good  CLINICAL DECISION MAKING: Evolving/moderate complexity  EVALUATION COMPLEXITY: Moderate   GOALS: Goals reviewed with patient? Yes  SHORT TERM GOALS: Target date: 09/29/2022   Patient will be independent with initial HEP. Baseline: given Goal status: MET 09/28/22- too busy on vacation to do, tried 1x.  10/12/22- compliant.    LONG TERM GOALS: Target date: 11/03/2022   Patient will be independent with advanced/ongoing HEP to improve outcomes and carryover.  Baseline:  Goal status: IN PROGRESS  2.  Patient will report at least 75% improvement in R knee buckling/sharp pain to improve QOL. Baseline: intermittent but daily Goal status: IN PROGRESS - 10/26/22 50% improvement   3.  Patient will be able to ascend/descend stairs with 1 HR and reciprocal step pattern safely to access home and community.  Baseline: ascends with step to gait and HR, descends step to gait slightly sideways and HR.  Goal status: IN PROGRESS - 10/26/22  4.  Patient will report >50/80 on LEFS to demonstrate improved functional ability. Baseline: 42/80 = 52.5% ability  Goal status: MET- 10/26/22 57 / 80 = 71.3 %  5.  Patient will demonstrate at least >22/30 on FGA to decrease low risk of of falls. Baseline: NT Goal status: MET - 10/29/22   6.  Patient will demonstrate improved hamstring flexibility with SLR to 75 deg to decrease muscle imbalance and improve knee extension.  Baseline: lacking full knee extension bil, moderate hamstring tightness ~ 60 deg bil.  Goal status: MET- 10/29/22    PLAN:  PT FREQUENCY: 1-2x/week  PT DURATION: 7 weeks (missing first week due to vacation)  PLANNED INTERVENTIONS: Therapeutic exercises, Therapeutic activity, Neuromuscular re-education, Balance training, Gait training, Patient/Family education, Self Care, Joint mobilization, Stair training, Dry Needling, Electrical stimulation, Spinal mobilization, Cryotherapy, Moist heat,  Taping, Ultrasound, Ionotophoresis '4mg'$ /ml Dexamethasone, Manual therapy, and Re-evaluation  PLAN FOR NEXT SESSION:  focus on  glute strengthening, stairs, eccentric strengthening    Artist Pais, PTA 10/29/2022, 12:01 PM

## 2022-11-02 ENCOUNTER — Ambulatory Visit: Payer: Medicare Other | Admitting: Physical Therapy

## 2022-11-02 ENCOUNTER — Encounter: Payer: Self-pay | Admitting: Physical Therapy

## 2022-11-02 DIAGNOSIS — M25561 Pain in right knee: Secondary | ICD-10-CM | POA: Diagnosis not present

## 2022-11-02 DIAGNOSIS — G8929 Other chronic pain: Secondary | ICD-10-CM

## 2022-11-02 DIAGNOSIS — R262 Difficulty in walking, not elsewhere classified: Secondary | ICD-10-CM

## 2022-11-02 DIAGNOSIS — M25662 Stiffness of left knee, not elsewhere classified: Secondary | ICD-10-CM

## 2022-11-02 DIAGNOSIS — M6281 Muscle weakness (generalized): Secondary | ICD-10-CM

## 2022-11-02 DIAGNOSIS — M25661 Stiffness of right knee, not elsewhere classified: Secondary | ICD-10-CM

## 2022-11-02 NOTE — Therapy (Addendum)
OUTPATIENT PHYSICAL THERAPY TREATMENT     Patient Name: Jimmy [name] MRN: [id number] DOB:Apr 03, 1951, 72 y.o., male Today's Date: 11/02/2022  END OF SESSION:  PT End of Session - 11/02/22 0910     Visit Number 12    Number of Visits 12    Date for PT Re-Evaluation 11/03/22    Authorization Type Medicare + [employer]    Progress Note Due on Visit 10    PT Start Time 0915    PT Stop Time 1005    PT Time Calculation (min) 50 min    Activity Tolerance Patient tolerated treatment well    Behavior During Therapy [facility] for tasks assessed/performed                  Past Medical History:  Diagnosis Date   BCC (basal cell carcinoma of skin) 2013   dr Allyson Sabal    CKD (chronic kidney disease), stage III ([place])    Diabetes mellitus    Hypertension    Obesity    Past Surgical History:  Procedure Laterality Date   pilonidal cyst surgery     POLYPECTOMY     Patient Active Problem List   Diagnosis Date Noted   PCP NOTES >>>> 07/11/2015   Dyspnea on exertion 04/03/2012   BCC (basal cell carcinoma of skin) 04/03/2012   Annual physical exam 01/13/2012   Edema 03/15/2011   Hyperlipidemia 03/15/2011   Constipation 03/15/2011   ERECTILE DYSFUNCTION, NON-ORGANIC 07/03/2010   CKD (chronic kidney disease), stage III ([place]) 06/18/2008   DM II (diabetes mellitus, type II), controlled ([place]) 11/09/2006   Morbid obesity ([place]) 11/09/2006   HTN (hypertension) 11/09/2006    PCP: Colon Branch, MD  REFERRING PROVIDER: Gregor Hams, MD   REFERRING DIAG:  M25.561,M25.562,G89.29 (ICD-10-CM) - Chronic pain of both knees  M17.0 (ICD-10-CM) - Primary osteoarthritis of both knees    THERAPY DIAG:  Chronic pain of right knee  Chronic pain of left knee  Stiffness of left knee, not elsewhere classified  Difficulty in walking, not elsewhere classified  Stiffness of right knee, not elsewhere classified  Muscle weakness (generalized)  Rationale for Evaluation and Treatment:  Rehabilitation  ONSET DATE: chronic  SUBJECTIVE:   SUBJECTIVE STATEMENT: No pain in the knees unless locking up.  Does feel that PT has helped a lot, but still having a lot of difficulty with stairs.  Does feel he has a very good HEP and exercise routine now.     PERTINENT HISTORY: DM PAIN:  Are you having pain? Yes: NPRS scale: 0/10 Pain location: R knee Pain description: normal ache 1, but intermittent sharp shooting pain is 8/10, feels like knee cap out of place, doesn't trust knee Aggravating factors: moving knee wrong, doesn't trust going up stairs Relieving factors: stretching   Are you having pain? Yes: NPRS scale: 0/10 Pain location: L knee  Pain description: ache Aggravating factors: exercise Relieving factors: stretching  PRECAUTIONS: None  WEIGHT BEARING RESTRICTIONS: No  FALLS:  Has patient fallen in last 6 months? No  LIVING ENVIRONMENT: Lives with: lives with their spouse Lives in: House/apartment Stairs: Yes: Internal: 14 steps; on right going up Has following equipment at home:  exercise bike, exercise step, hand weights, elastic bands, balance pad  OCCUPATION: retired   PLOF: Independent  PATIENT GOALS: be able to walk up stairs with alternating feet and no handrail - if I can do that the rest will take care of itself.   NEXT MD VISIT:  11/09/22  OBJECTIVE:   DIAGNOSTIC FINDINGS: 08/31/2022 DG R knee FINDINGS: Tricompartmental degenerative changes are noted worst in the medial joint space. No fracture is noted. No sizable joint effusion is seen. Multiple calcified loose bodies are noted. No other soft tissue abnormality is seen. 08/31/2022   L knee FINDINGS: Tricompartmental degenerative changes are noted worst in the medial joint space. No joint effusion is seen. No acute fracture or dislocation is noted. Small calcified loose bodies are noted.  PATIENT SURVEYS:  LEFS 42/80  COGNITION: Overall cognitive status: Within functional limits for  tasks assessed     SENSATION: Some neuropathy in feet   MUSCLE LENGTH: Hamstrings: moderate tightness bil Right 60 deg; Left 60 deg 75 deg both sides (10/29/22)   POSTURE: forward head  PALPATION: Tenderness over bil MCL, R LCL  LOWER EXTREMITY ROM:  Active ROM Right eval Left eval Right 10/26/22 Left 10/26/22  Knee flexion 120 115 120 121  Knee extension '8 7 3 2   '$ (Blank rows = not tested)  LOWER EXTREMITY MMT:  MMT Right eval Left eval  Hip flexion 4+ 4+  Hip extension 5 5  Hip abduction 5 5  Hip adduction 5 5  Knee flexion 5 5  Knee extension 5 5  Ankle dorsiflexion 5 5  Ankle plantarflexion 5 5   (Blank rows = not tested)  LOWER EXTREMITY SPECIAL TESTS:  All ligaments intact bil knees, no concerns for meniscus.     FUNCTIONAL TESTS:  5 times sit to stand: 9.6 sec without UE assist, table height 20 inches.   GAIT: Distance walked: 26' Assistive device utilized: None Level of assistance: Complete Independence Comments: antalgic/Trendelenburg gait, step to gait with HR ascending, step to gait with HR descending with body angled sideways.    TODAY'S TREATMENT:                                                                                                                              DATE:   11/02/22 Therapeutic Exercise: to improve strength and mobility.  Demo, verbal and tactile cues throughout for technique. Treadmill 1.1 mph x 10 min  Stairs x 14 (1 HR, reciprocal step) Review and progression of HEP-  Adding Eccentric heel raises x 10 raising with both, lowering on R SLS with quad set x 30 sec with 1 UE support Tandem stance x 30 sec with 1 UE support Review of gym equipment - leg presses, heel raises, does not have acceptable hamstring machine so continue forward T's for strengthening Self Care: Education on progress, continued strengthening, recommendations when to continue PT, safety.   10/29/22 FGA for warm up: Functional Gait Assessment: 23 / 30  = 76.7 % Hamstring flexibility measured Supine SLR 2x10 bil Supine bridge with ball squeeze 2x10 Wall squat 10x5" with ball squeeze Side lunge reaching to ankle x 5 R  10/26/22 Therapeutic Exercise: to improve strength and mobility.  Demo, verbal and tactile cues throughout for  technique. Treadmill x 8 min at 1.0 mph Mini wall squat with VMO ball squeeze 10x5" Wall squats x 10  Single leg RDL x 10 bil Knee flexion 35# BLE 2x10; 10# RLE 2x10  Seated LAQ with VMO ball squeeze x 10 S/L hip adduction x 10 bil   Therapeutic Activity: to improve functional performance. Lower Extremity Functional Score: 57 / 80 = 71.3 % Knee ROM   10/22/22 Therapeutic Exercise: to improve strength and mobility.  Demo, verbal and tactile cues throughout for technique. Treadmill x 8 min at 1.0 mph Stair x 14 with 1HR on R side Seated hip ADD ball squeeze  Seated LAQ with ball sueeze Mini squat with ball squeeze 10x5" Knee flexion 35# 2x10 BLE; 15# RLE 2x10   10/19/22 Therapeutic Exercise: to improve strength and mobility.  Demo, verbal and tactile cues throughout for technique. Treadmill x 8 min at 1.0 mph Hip hike on step 6' x 10  Standing hip abduction 2# 2x10 Standing hip extension 2# 2x10 Standing HS curls 2# x 10 Seated LAQ with vmo ball squeeze x 15 Seated ball squeezes x 15   PATIENT EDUCATION:  Education details: HEP update Person educated: Patient Education method: Explanation, Demonstration, Verbal cues, and Handouts Education comprehension: verbalized understanding and returned demonstration  HOME EXERCISE PROGRAM: Access Code: [email or web address] URL: [email or web address] Date: 11/02/2022 Prepared by: Glenetta Hew  Exercises - Standing 3-Way Leg Reach with Resistance at Ankles and Counter Support  - 1 x daily - 7 x weekly - 3 sets - 10 reps - Standing Tandem Balance with Counter Support  - 1 x daily - 7 x weekly - 1 sets - 2 reps - 30 sec  hold - Standing Single Leg  Stance with Counter Support  - 1 x daily - 7 x weekly - 1 sets - 2 reps - 30 sec  hold - Wall Squat Hold with Ball  - 1 x daily - 7 x weekly - 3 sets - 10 reps - 5 sec hold - Toe Raise With Back Against Wall  - 1 x daily - 7 x weekly - 3 sets - 10 reps - Standing Eccentric Heel Raise  - 1 x daily - 7 x weekly - 2-3 sets - 10 reps - Forward T with Counter Support  - 1 x daily - 7 x weekly - 2-3 sets - 10 reps - Hip Hiking on Step  - 1 x daily - 7 x weekly - 2-3 sets - 10 reps - Supine Straight Leg Raises  - 1 x daily - 7 x weekly - 2-3 sets - 10 reps - Supine Bridge  - 1 x daily - 7 x weekly - 2-3 sets - 10 reps - Clamshell  - 1 x daily - 7 x weekly - 2-3 sets - 10 reps - Prone Hip Extension  - 1 x daily - 7 x weekly - 2-3 sets - 10 reps - Supine Hamstring Stretch with Strap  - 1 x daily - 7 x weekly - 3 sets - 10 reps - Seated Quad Set  - 3 x daily - 7 x weekly - 1 sets - 10 reps - 5-10 sec hold - Squat with Chair and Counter Support  - 1 x daily - 7 x weekly - 3 sets - 10 reps - Seated Hip Adduction Squeeze with Ball  - 1 x daily - 7 x weekly - 3 sets - 10 repsAccess Code: [email or web address] URL: [email or web address] Date: 10/12/2022 Prepared by: Glenetta Hew  Exercises Added   - Seated Quad Set  - 3 x daily - 7 x weekly - 1 sets - 10 reps - 5-10 sec hold - Standing Quad Set  - 3 x daily - 7 x weekly - 1 sets - 10 reps - 5 sec hold   ASSESSMENT:  CLINICAL IMPRESSION: Cormick Sheffield Meschke has made good progress in physical therapy and has met or almost met all goals with exception of being able to navigate stairs with reciprocal step.  He still requires HR and step to gait, but no longer has to turn sideways.  He still reports crepitus and locking sensation in knees, but this has improved significantly.  He attends gym regularly for swimming and weight training, and is consistent with his HEP, so is exercising now 2-2.5 hours/day, 4 days/week.  Recommended he continue with his HEP and  exercises at gym to continue to build strength, if he feels that he is plateauing with progress, he can request return to PT for reassessment and progression of exercises.  He was in agreement with this place.  Progressed his exercises today to also add balance exercises for continued hip strengthening and safety.  All exercises are to be performed at counter or wall for safety.     OBJECTIVE IMPAIRMENTS: Abnormal gait, decreased activity tolerance, difficulty walking, decreased ROM, decreased strength, hypomobility, impaired flexibility, and pain.   ACTIVITY LIMITATIONS: bending, standing, squatting, stairs, transfers, and locomotion level  PARTICIPATION LIMITATIONS: community activity  PERSONAL FACTORS: Fitness, Time since onset of injury/illness/exacerbation, and 3+ comorbidities: obesity, T2DM, CKD, HTN, osteoarthritis  are also affecting patient's functional outcome.   REHAB POTENTIAL: Good  CLINICAL DECISION MAKING: Evolving/moderate complexity  EVALUATION COMPLEXITY: Moderate   GOALS: Goals reviewed with patient? Yes  SHORT TERM GOALS: Target date: 09/29/2022   Patient will be independent with initial HEP. Baseline: given Goal status: MET 09/28/22- too busy on vacation to do, tried 1x.  10/12/22- compliant.    LONG TERM GOALS: Target date: 11/03/2022   Patient will be independent with advanced/ongoing HEP to improve outcomes and carryover.  Baseline:  Goal status: MET 11/02/2022  2.  Patient will report at least 75% improvement in R knee buckling/sharp pain to improve QOL. Baseline: intermittent but daily Goal status: IN PROGRESS - 10/26/22 50% improvement   3.  Patient will be able to ascend/descend stairs with 1 HR and reciprocal step pattern safely to access home and community.  Baseline: ascends with step to gait and HR, descends step to gait slightly sideways and HR.  Goal status: NOT MET 11/02/22- HR + step to gait.   4.  Patient will report >50/80 on LEFS to demonstrate  improved functional ability. Baseline: 42/80 = 52.5% ability  Goal status: MET- 10/26/22 57 / 80 = 71.3 %  5.  Patient will demonstrate at least >22/30 on FGA to decrease low risk of of falls. Baseline: NT Goal status: MET - 10/29/22   6.  Patient will demonstrate improved hamstring flexibility with SLR to 75 deg to decrease muscle imbalance and improve knee extension.  Baseline: lacking full knee extension bil, moderate hamstring tightness ~ 60 deg bil.  Goal status: MET- 10/29/22    PLAN:  PT FREQUENCY: 1-2x/week  PT DURATION: 7 weeks (missing first week due to vacation)  PLANNED INTERVENTIONS: Therapeutic exercises, Therapeutic activity, Neuromuscular re-education, Balance training, Gait training, Patient/Family education, Self Care, Joint mobilization, Stair training, Dry Needling, Electrical stimulation, Spinal mobilization, Cryotherapy, Moist heat, Taping, Ultrasound, Ionotophoresis '4mg'$ /ml Dexamethasone, Manual  therapy, and Re-evaluation  PLAN FOR NEXT SESSION:  30 day hold  Jena Gauss, PT, DPT  11/02/2022, 10:19 AM   PHYSICAL THERAPY DISCHARGE SUMMARY  Visits from Start of Care: 12  Current functional level related to goals / functional outcomes: 50% improvement in bil knee pain, LEFS improved from 52.5% to 71.3%, improved hamstring flexibility.    Remaining deficits: Bil knee pain   Education / Equipment: HEP  Plan: Patient agrees to discharge.  Patient is being discharged due to meeting the stated rehab goals.     Jena Gauss, PT, DPT 01/13/2023 2:37 PM

## 2022-11-08 NOTE — Progress Notes (Unsigned)
Shirlyn Goltz, PhD, LAT, ATC acting as a scribe for Lynne Leader, MD.  Jimmy [name] is a 72 y.o. male who presents to [place] at [facility] today for f/u bilat knee and R shoulder pain. Pt was last seen by Dr. Georgina Snell on 08/31/22 and [employer] was contacted to fit pt w/ a custom off-loader knee brace and he was referred to PT, completing 12 visits. Today, pt reports continued BILAT knee pain. Sx have improved with PT, feels like he is getting stronger and more flexible. Wearing bilat knee braces today. Some swelling. Mechanical sx present, R>L. Intermittent radiating pain into the right leg, when the knee catches. Will take  Acetaminophen after being more active. Compliant with HEP provided by PT. Working out 4-5 days weekly.   Continues to have intermittent anterior shoulder pain on the right.   Dx imaging: 08/31/22 R & L knee and R shoulder XR  Pertinent review of systems: No fevers or chills  Relevant historical information: Hypertension and diabetes.   Exam:  BP 130/78   Pulse (!) 55   Ht '5\' 10"'$  (1.778 m)   Wt 267 lb 6.4 oz (121.3 kg)   SpO2 99%   BMI 38.37 kg/m  General: Well Developed, well nourished, and in no acute distress.   MSK: Right shoulder: Normal-appearing normal motion. Intact strength.  Bilateral knees normal-appearing using medial off loader knee braces.  Normal motion. Normal gait.    Lab and Radiology Results  DG Knee AP/LAT W/Sunrise Right  Result Date: 08/31/2022 CLINICAL DATA:  Knee pain, initial encounter EXAM: RIGHT KNEE 3 VIEWS COMPARISON:  None Available. FINDINGS: Tricompartmental degenerative changes are noted worst in the medial joint space. No fracture is noted. No sizable joint effusion is seen. Multiple calcified loose bodies are noted. No other soft tissue abnormality is seen. IMPRESSION: Degenerative change without acute abnormality. Electronically Signed   By: Inez Catalina M.D.   On: 08/31/2022 20:22   DG Knee AP/LAT  W/Sunrise Left  Result Date: 08/31/2022 CLINICAL DATA:  Bilateral knee pain EXAM: LEFT KNEE 3 VIEWS COMPARISON:  None Available. FINDINGS: Tricompartmental degenerative changes are noted worst in the medial joint space. No joint effusion is seen. No acute fracture or dislocation is noted. Small calcified loose bodies are noted. IMPRESSION: Tricompartmental degenerative changes without acute effusion. Electronically Signed   By: Inez Catalina M.D.   On: 08/31/2022 20:21   DG Shoulder Right  Result Date: 08/31/2022 CLINICAL DATA:  Shoulder pain EXAM: RIGHT SHOULDER - 2+ VIEW COMPARISON:  None Available. FINDINGS: AC joint and glenohumeral degenerative changes. No fracture or dislocation. No other abnormalities are identified to explain the patient's pain. IMPRESSION: AC joint and glenohumeral degenerative changes. No other abnormalities. Electronically Signed   By: Dorise Bullion III M.D.   On: 08/31/2022 18:23    I, Lynne Leader, personally (independently) visualized and performed the interpretation of the images attached in this note.    Assessment and Plan: 72 y.o. 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  He has done quite well with physical therapy and medial off loader knee braces. We discussed options.  Plan to continue current conservative management.  Next step if needed would be steroid injections.  His pain is not bothersome enough that he would like to pursue that at this time.  Continue home exercise and check back as needed.  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  Again  much better with physical therapy.  Again next step may be steroid injection either into the glenohumeral joint or subacromial bursa.  Continue home exercise program and check back as needed.   Discussed warning signs or symptoms. Please see discharge instructions. Patient expresses understanding.   The above documentation has been reviewed and is  accurate and complete Lynne Leader, M.D.

## 2022-11-09 ENCOUNTER — Encounter: Payer: Self-pay | Admitting: Family Medicine

## 2022-11-09 ENCOUNTER — Ambulatory Visit (INDEPENDENT_AMBULATORY_CARE_PROVIDER_SITE_OTHER): Payer: Medicare Other | Admitting: Family Medicine

## 2022-11-09 VITALS — BP 130/78 | HR 55 | Ht 70.0 in | Wt 267.4 lb

## 2022-11-09 DIAGNOSIS — M25511 Pain in right shoulder: Secondary | ICD-10-CM | POA: Diagnosis not present

## 2022-11-09 DIAGNOSIS — M19011 Primary osteoarthritis, right shoulder: Secondary | ICD-10-CM | POA: Diagnosis not present

## 2022-11-09 DIAGNOSIS — M25562 Pain in left knee: Secondary | ICD-10-CM

## 2022-11-09 DIAGNOSIS — M25561 Pain in right knee: Secondary | ICD-10-CM | POA: Diagnosis not present

## 2022-11-09 DIAGNOSIS — M170 Bilateral primary osteoarthritis of knee: Secondary | ICD-10-CM | POA: Insufficient documentation

## 2022-11-09 DIAGNOSIS — G8929 Other chronic pain: Secondary | ICD-10-CM | POA: Insufficient documentation

## 2022-12-17 ENCOUNTER — Other Ambulatory Visit: Payer: Self-pay | Admitting: Internal Medicine

## 2022-12-22 ENCOUNTER — Other Ambulatory Visit: Payer: Self-pay | Admitting: Internal Medicine

## 2022-12-24 ENCOUNTER — Ambulatory Visit (INDEPENDENT_AMBULATORY_CARE_PROVIDER_SITE_OTHER): Payer: Medicare Other | Admitting: *Deleted

## 2022-12-24 VITALS — BP 136/75 | HR 55 | Ht 70.0 in | Wt 263.0 lb

## 2022-12-24 DIAGNOSIS — Z0000 Encounter for general adult medical examination without abnormal findings: Secondary | ICD-10-CM | POA: Diagnosis not present

## 2022-12-24 NOTE — Patient Instructions (Signed)
Jimmy [name] , Thank you for taking time to come for your Medicare Wellness Visit. I appreciate your ongoing commitment to your health goals. Please review the following plan we discussed and let me know if I can assist you in the future.   These are the goals we discussed:  Goals   None     This is a list of the screening recommended for you and due dates:  Health Maintenance  Topic Date Due   COVID-19 Vaccine (4 - 2023-24 season) 04/30/2022   Complete foot exam   02/02/2023   Hemoglobin A1C  02/16/2023   Flu Shot  03/31/2023   Yearly kidney function blood test for diabetes  06/12/2023   Yearly kidney health urinalysis for diabetes  06/12/2023   Eye exam for diabetics  06/15/2023   Medicare Annual Wellness Visit  12/24/2023   Colon Cancer Screening  03/27/2025   DTaP/Tdap/Td vaccine (3 - Td or Tdap) 12/11/2026   Pneumonia Vaccine  Completed   Hepatitis C Screening: USPSTF Recommendation to screen - Ages 58-79 yo.  Completed   Zoster (Shingles) Vaccine  Completed   HPV Vaccine  Aged Out     Next appointment: Follow up in one year for your annual wellness visit.   Preventive Care 34 Years and Older, Male Preventive care refers to lifestyle choices and visits with your health care provider that can promote health and wellness. What does preventive care include? A yearly physical exam. This is also called an annual well check. Dental exams once or twice a year. Routine eye exams. Ask your health care provider how often you should have your eyes checked. Personal lifestyle choices, including: Daily care of your teeth and gums. Regular physical activity. Eating a healthy diet. Avoiding tobacco and drug use. Limiting alcohol use. Practicing safe sex. Taking low doses of aspirin every day. Taking vitamin and mineral supplements as recommended by your health care provider. What happens during an annual well check? The services and screenings done by your health care provider during  your annual well check will depend on your age, overall health, lifestyle risk factors, and family history of disease. Counseling  Your health care provider may ask you questions about your: Alcohol use. Tobacco use. Drug use. Emotional well-being. Home and relationship well-being. Sexual activity. Eating habits. History of falls. Memory and ability to understand (cognition). Work and work Astronomer. Screening  You may have the following tests or measurements: Height, weight, and BMI. Blood pressure. Lipid and cholesterol levels. These may be checked every 5 years, or more frequently if you are over 32 years old. Skin check. Lung cancer screening. You may have this screening every year starting at age 59 if you have a 30-pack-year history of smoking and currently smoke or have quit within the past 15 years. Fecal occult blood test (FOBT) of the stool. You may have this test every year starting at age 15. Flexible sigmoidoscopy or colonoscopy. You may have a sigmoidoscopy every 5 years or a colonoscopy every 10 years starting at age 6. Prostate cancer screening. Recommendations will vary depending on your family history and other risks. Hepatitis C blood test. Hepatitis B blood test. Sexually transmitted disease (STD) testing. Diabetes screening. This is done by checking your blood sugar (glucose) after you have not eaten for a while (fasting). You may have this done every 1-3 years. Abdominal aortic aneurysm (AAA) screening. You may need this if you are a current or former smoker. Osteoporosis. You may be screened starting  at age 15 if you are at high risk. Talk with your health care provider about your test results, treatment options, and if necessary, the need for more tests. Vaccines  Your health care provider may recommend certain vaccines, such as: Influenza vaccine. This is recommended every year. Tetanus, diphtheria, and acellular pertussis (Tdap, Td) vaccine. You may need  a Td booster every 10 years. Zoster vaccine. You may need this after age 56. Pneumococcal 13-valent conjugate (PCV13) vaccine. One dose is recommended after age 9. Pneumococcal polysaccharide (PPSV23) vaccine. One dose is recommended after age 3. Talk to your health care provider about which screenings and vaccines you need and how often you need them. This information is not intended to replace advice given to you by your health care provider. Make sure you discuss any questions you have with your health care provider. Document Released: 09/12/2015 Document Revised: 05/05/2016 Document Reviewed: 06/17/2015 Elsevier Interactive Patient Education  2017 [place] Prevention in the Home Falls can cause injuries. They can happen to people of all ages. There are many things you can do to make your home safe and to help prevent falls. What can I do on the outside of my home? Regularly fix the edges of walkways and driveways and fix any cracks. Remove anything that might make you trip as you walk through a door, such as a raised step or threshold. Trim any bushes or trees on the path to your home. Use bright outdoor lighting. Clear any walking paths of anything that might make someone trip, such as rocks or tools. Regularly check to see if handrails are loose or broken. Make sure that both sides of any steps have handrails. Any raised decks and porches should have guardrails on the edges. Have any leaves, snow, or ice cleared regularly. Use sand or salt on walking paths during winter. Clean up any spills in your garage right away. This includes oil or grease spills. What can I do in the bathroom? Use night lights. Install grab bars by the toilet and in the tub and shower. Do not use towel bars as grab bars. Use non-skid mats or decals in the tub or shower. If you need to sit down in the shower, use a plastic, non-slip stool. Keep the floor dry. Clean up any water that spills on the  floor as soon as it happens. Remove soap buildup in the tub or shower regularly. Attach bath mats securely with double-sided non-slip rug tape. Do not have throw rugs and other things on the floor that can make you trip. What can I do in the bedroom? Use night lights. Make sure that you have a light by your bed that is easy to reach. Do not use any sheets or blankets that are too big for your bed. They should not hang down onto the floor. Have a firm chair that has side arms. You can use this for support while you get dressed. Do not have throw rugs and other things on the floor that can make you trip. What can I do in the kitchen? Clean up any spills right away. Avoid walking on wet floors. Keep items that you use a lot in easy-to-reach places. If you need to reach something above you, use a strong step stool that has a grab bar. Keep electrical cords out of the way. Do not use floor polish or wax that makes floors slippery. If you must use wax, use non-skid floor wax. Do not have throw rugs  and other things on the floor that can make you trip. What can I do with my stairs? Do not leave any items on the stairs. Make sure that there are handrails on both sides of the stairs and use them. Fix handrails that are broken or loose. Make sure that handrails are as long as the stairways. Check any carpeting to make sure that it is firmly attached to the stairs. Fix any carpet that is loose or worn. Avoid having throw rugs at the top or bottom of the stairs. If you do have throw rugs, attach them to the floor with carpet tape. Make sure that you have a light switch at the top of the stairs and the bottom of the stairs. If you do not have them, ask someone to add them for you. What else can I do to help prevent falls? Wear shoes that: Do not have high heels. Have rubber bottoms. Are comfortable and fit you well. Are closed at the toe. Do not wear sandals. If you use a stepladder: Make sure that  it is fully opened. Do not climb a closed stepladder. Make sure that both sides of the stepladder are locked into place. Ask someone to hold it for you, if possible. Clearly mark and make sure that you can see: Any grab bars or handrails. First and last steps. Where the edge of each step is. Use tools that help you move around (mobility aids) if they are needed. These include: Canes. Walkers. Scooters. Crutches. Turn on the lights when you go into a dark area. Replace any light bulbs as soon as they burn out. Set up your furniture so you have a clear path. Avoid moving your furniture around. If any of your floors are uneven, fix them. If there are any pets around you, be aware of where they are. Review your medicines with your doctor. Some medicines can make you feel dizzy. This can increase your chance of falling. Ask your doctor what other things that you can do to help prevent falls. This information is not intended to replace advice given to you by your health care provider. Make sure you discuss any questions you have with your health care provider. Document Released: 06/12/2009 Document Revised: 01/22/2016 Document Reviewed: 09/20/2014 Elsevier Interactive Patient Education  2017 [employer].

## 2022-12-24 NOTE — Progress Notes (Signed)
Subjective:  Pt completed ADLs, Fall risk, and SDOH during e-check in on 12/17/22.  Answers verified with pt.    Jimmy [name] is a 72 y.o. male who presents for Medicare Annual/Subsequent preventive examination.  Review of Systems     Cardiac Risk Factors include: advanced age (>33men, >62 women);obesity (BMI >30kg/m2);hypertension;dyslipidemia;male gender;diabetes mellitus     Objective:    Today's Vitals   12/24/22 0819  BP: 136/75  Pulse: (!) 55  Weight: 263 lb (119.3 kg)  Height: 5\' 10"  (1.778 m)   Body mass index is 37.74 kg/m.     12/24/2022    8:49 AM 09/15/2022    9:44 AM 03/14/2015   10:33 AM  Advanced Directives  Does Patient Have a Medical Advance Directive? Yes Yes Yes  Type of Estate agent of [place];Living will Healthcare Power of [employer] of [place];Living will  Does patient want to make changes to medical advance directive? No - Patient declined No - Patient declined   Copy of Healthcare Power of Attorney in Chart? No - copy requested No - copy requested     Current Medications (verified) Outpatient Encounter Medications as of 12/24/2022  Medication Sig   Ascorbic Acid (VITAMIN C) 1000 MG tablet Take 1,000 mg by mouth in the morning and at bedtime.   aspirin 81 MG tablet Take 81 mg by mouth daily. Reported on 01/13/2016   atorvastatin (LIPITOR) 10 MG tablet TAKE 1/2 TABLET (5 MG TOTAL) BY MOUTH AT BEDTIME.   Cholecalciferol (VITAMIN D3) 1000 UNITS CAPS Take 1,000 Units by mouth in the morning and at bedtime.   Insulin Pen Needle (TECHLITE PEN NEEDLES) 32G X 6 MM MISC USE AS DIRECTED TO INJECT VICTOZA   liraglutide (VICTOZA) 18 MG/3ML SOPN INJECT 1.2 MG INTO THE SKIN DAILY AT 12 NOON.   losartan (COZAAR) 100 MG tablet Take 0.5 tablets (50 mg total) by mouth daily.   metoprolol tartrate (LOPRESSOR) 50 MG tablet Take 0.5 tablets (25 mg total) by mouth 2 (two) times daily.   tadalafil (CIALIS) 20 MG tablet TAKE 1 TABLET  BY MOUTH EVERY OTHER DAY AS NEEDED FOR ERECTILE DYSFUNCTION *NOT COVERED   No facility-administered encounter medications on file as of 12/24/2022.    Allergies (verified) Benazepril hcl and Valsartan   History: Past Medical History:  Diagnosis Date   Arthritis    BCC (basal cell carcinoma of skin) 2013   dr Terri Piedra    Cataract    CKD (chronic kidney disease), stage III (HCC)    Diabetes mellitus    Hypertension    Obesity    Past Surgical History:  Procedure Laterality Date   pilonidal cyst surgery     POLYPECTOMY     Family History  Problem Relation Age of Onset   Cancer Mother        type?   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, Runner, broadcasting/film/video @ [employer]  Tobacco Use   Smoking status: Never   Smokeless tobacco: Never  Substance and Sexual Activity   Alcohol use: Yes    Alcohol/week: 0.0 standard drinks of alcohol    Comment: 6 beers a year -rare    Drug use: No   Sexual activity: Yes  Other Topics Concern   Not on file  Social History Narrative   Household- pt, wife   Grown children , 8 g-children         Social Determinants of Health   Financial Resource Strain: Low Risk  (12/17/2022)   Overall Financial Resource Strain (CARDIA)    Difficulty of Paying Living Expenses: Not hard at all  Food Insecurity: No Food Insecurity (12/17/2022)   Hunger Vital Sign    Worried About Running Out of Food in the Last Year: Never true    Ran Out of Food in the Last Year: Never true  Transportation  Needs: No Transportation Needs (12/17/2022)   PRAPARE - Administrator, Civil Service (Medical): No    Lack of Transportation (Non-Medical): No  Physical Activity: Sufficiently Active (12/17/2022)   Exercise Vital Sign    Days of Exercise per Week: 4 days    Minutes of Exercise per Session: 120 min  Stress: No Stress Concern Present (12/17/2022)   [employer] of Occupational Health - Occupational Stress Questionnaire    Feeling of Stress : Not at all  Social Connections: Unknown (12/17/2022)   Social Connection and Isolation Panel [NHANES]    Frequency of Communication with Friends and Family: Once a week    Frequency of Social Gatherings with Friends and Family: Twice a week    Attends Religious Services: Not on Marketing executive or Organizations: No    Attends Banker Meetings: Never    Marital Status: Married    Tobacco Counseling Counseling given: Not Answered   Clinical Intake:  Pre-visit preparation completed: Yes  Pain : No/denies pain  BMI - recorded: 37.74 Nutritional Status: BMI > 30  Obese Nutritional Risks: None Diabetes: Yes CBG done?: No Did pt. bring in CBG monitor from home?: No  How often do you need to have someone help you when you read instructions, pamphlets, or other written materials from your doctor or pharmacy?: 1 - Never  Activities of Daily Living    12/17/2022    9:18 AM  In your present state of health, do you have any difficulty performing the following activities:  Hearing? 0  Vision? 0  Difficulty concentrating or making decisions? 0  Walking or climbing stairs? 1  Dressing or bathing? 0  Doing errands, shopping? 0  Preparing Food and eating ? N  Using the Toilet? N  In the past six months, have you accidently leaked urine? N  Do you have problems with loss of bowel control? N  Managing your Medications? N  Managing your Finances? N  Housekeeping or managing your Housekeeping? N     Patient Care Team: Wanda Plump, MD as PCP - General Annie Sable, MD as Consulting Physician (Nephrology) Tedd Sias, MD as Referring Physician (Optometry) Everardo Pacific, OD as Referring Physician (Optometry)  Indicate any recent Medical Services you may have received from other than [facility] providers in the past year (date may be approximate).     Assessment:   This is a routine wellness  examination for Jimmy [name].  Hearing/Vision screen No results found.  Dietary issues and exercise activities discussed: Current Exercise Habits: Home exercise routine, Type of exercise: strength training/weights;treadmill;Other - see comments;walking (stationary bike, swimming), Time (Minutes): > 60 (120 min), Frequency (Times/Week): 4, Weekly Exercise (Minutes/Week): 0, Intensity: Moderate, Exercise limited by: orthopedic condition(s)   Goals Addressed   None    Depression Screen    12/24/2022    8:28 AM 08/17/2022   10:47 AM 07/06/2022    2:30 PM 02/01/2022    8:54 AM 07/31/2021    9:43 AM 07/28/2020    9:24 AM 07/20/2019    8:28 AM  PHQ 2/9 Scores  PHQ - 2 Score 0 0 0 0 0 0 0    Fall Risk    12/17/2022    9:18 AM 08/17/2022   10:46 AM 07/06/2022    2:29 PM 02/01/2022    8:54 AM 07/31/2021    9:43 AM  Fall Risk   Falls in the past year? 0 0 0 0 0  Number falls in past yr: 0 0 0 0 0  Injury with Fall? 0 0 0 0 0  Risk for fall due to : No Fall Risks      Follow up Falls evaluation completed Falls evaluation completed Falls evaluation completed Falls evaluation completed Falls evaluation completed    FALL RISK PREVENTION PERTAINING TO THE HOME:  Any stairs in or around the home? Yes  If so, are there any without handrails? No  Home free of loose throw rugs in walkways, pet beds, electrical cords, etc? Yes  Adequate lighting in your home to reduce risk of falls? Yes   ASSISTIVE DEVICES UTILIZED TO PREVENT FALLS:  Life alert? No  Use of a cane, walker or w/c? No  Grab bars  in the bathroom? Yes  Shower chair or bench in shower? Yes  Elevated toilet seat or a handicapped toilet? No   TIMED UP AND GO:  Was the test performed? Yes .  Length of time to ambulate 10 feet: 5 sec.   Gait steady and fast without use of assistive device  Cognitive Function:        12/24/2022    8:44 AM  6CIT Screen  What Year? 0 points  What month? 0 points  What time? 0 points  Count back from 20 0 points  Months in reverse 0 points  Repeat phrase 0 points  Total Score 0 points    Immunizations Immunization History  Administered Date(s) Administered   Fluad Quad(high Dose 65+) 05/11/2019, 07/28/2020, 07/06/2022   H1N1 09/19/2008   Influenza Split 06/17/2011, 06/20/2012   Influenza Whole 09/19/2007, 06/18/2008, 05/23/2009, 07/03/2010   Influenza, High Dose Seasonal PF 06/17/2017, 07/07/2018   Influenza,inj,Quad PF,6+ Mos 07/17/2013, 07/15/2014, 06/11/2016   Influenza-Unspecified 05/30/2015, 05/22/2021   Moderna Sars-Covid-2 Vaccination 11/10/2019, 12/13/2019, 07/28/2020   PNEUMOCOCCAL CONJUGATE-20 08/17/2022   Pneumococcal Conjugate-13 03/07/2014   Pneumococcal Polysaccharide-23 09/26/2009, 07/28/2020   Td 02/15/2007   Tdap 12/10/2016   Zoster Recombinat (Shingrix) 07/07/2018, 10/03/2018   Zoster, Live 03/15/2013    TDAP status: Up to date  Flu Vaccine status: Up to date  Pneumococcal vaccine status: Up to date  Covid-19 vaccine status: Information provided on how to obtain vaccines.   Qualifies for Shingles Vaccine? Yes   Zostavax completed Yes   Shingrix Completed?: Yes  Screening Tests Health Maintenance  Topic Date Due   Medicare Annual Wellness (AWV)  Never done   COVID-19 Vaccine (4 - 2023-24 season)  04/30/2022   FOOT EXAM  02/02/2023   HEMOGLOBIN A1C  02/16/2023   INFLUENZA VACCINE  03/31/2023   Diabetic kidney evaluation - eGFR measurement  06/12/2023   Diabetic kidney evaluation - Urine ACR  06/12/2023   OPHTHALMOLOGY EXAM  06/15/2023    COLONOSCOPY (Pts 45-35yrs Insurance coverage will need to be confirmed)  03/27/2025   DTaP/Tdap/Td (3 - Td or Tdap) 12/11/2026   Pneumonia Vaccine 59+ Years old  Completed   Hepatitis C Screening  Completed   Zoster Vaccines- Shingrix  Completed   HPV VACCINES  Aged Out    Health Maintenance  Health Maintenance Due  Topic Date Due   Medicare Annual Wellness (AWV)  Never done   COVID-19 Vaccine (4 - 2023-24 season) 04/30/2022    Colorectal cancer screening: Type of screening: Colonoscopy. Completed 03/28/15. Repeat every 10 years  Lung Cancer Screening: (Low Dose CT Chest recommended if Age 68-80 years, 30 pack-year currently smoking OR have quit w/in 15years.) does not qualify.   Additional Screening:  Hepatitis C Screening: does qualify; Completed 07/11/15  Vision Screening: Recommended annual ophthalmology exams for early detection of glaucoma and other disorders of the eye. Is the patient up to date with their annual eye exam?  Yes  Who is the provider or what is the name of the office in which the patient attends annual eye exams? [facility]. If pt is not established with a provider, would they like to be referred to a provider to establish care? No .   Dental Screening: Recommended annual dental exams for proper oral hygiene  Community Resource Referral / Chronic Care Management: CRR required this visit?  No   CCM required this visit?  No      Plan:     I have personally reviewed and noted the following in the patient's chart:   Medical and social history Use of alcohol, tobacco or illicit drugs  Current medications and supplements including opioid prescriptions. Patient is not currently taking opioid prescriptions. Functional ability and status Nutritional status Physical activity Advanced directives List of other physicians Hospitalizations, surgeries, and ER visits in previous 12 months Vitals Screenings to include cognitive, depression, and  falls Referrals and appointments  In addition, I have reviewed and discussed with patient certain preventive protocols, quality metrics, and best practice recommendations. A written personalized care plan for preventive services as well as general preventive health recommendations were provided to patient.     Donne Anon, [place]   12/24/2022   Nurse Notes: None

## 2023-01-31 ENCOUNTER — Encounter: Payer: Self-pay | Admitting: Family Medicine

## 2023-01-31 NOTE — Telephone Encounter (Signed)
Forwarding to Dr. Corey to review and advise.  

## 2023-02-16 ENCOUNTER — Encounter: Payer: Self-pay | Admitting: Internal Medicine

## 2023-02-16 ENCOUNTER — Ambulatory Visit (INDEPENDENT_AMBULATORY_CARE_PROVIDER_SITE_OTHER): Payer: Medicare Other | Admitting: Internal Medicine

## 2023-02-16 VITALS — BP 138/68 | HR 54 | Temp 97.9°F | Resp 18 | Ht 70.0 in | Wt 261.0 lb

## 2023-02-16 DIAGNOSIS — E785 Hyperlipidemia, unspecified: Secondary | ICD-10-CM

## 2023-02-16 DIAGNOSIS — I10 Essential (primary) hypertension: Secondary | ICD-10-CM | POA: Diagnosis not present

## 2023-02-16 DIAGNOSIS — N1831 Chronic kidney disease, stage 3a: Secondary | ICD-10-CM

## 2023-02-16 DIAGNOSIS — E118 Type 2 diabetes mellitus with unspecified complications: Secondary | ICD-10-CM | POA: Diagnosis not present

## 2023-02-16 DIAGNOSIS — Z7985 Long-term (current) use of injectable non-insulin antidiabetic drugs: Secondary | ICD-10-CM

## 2023-02-16 LAB — CBC WITH DIFFERENTIAL/PLATELET
Basophils Absolute: 0.1 10*3/uL (ref 0.0–0.1)
Basophils Relative: 1.2 % (ref 0.0–3.0)
Eosinophils Absolute: 0.8 10*3/uL — ABNORMAL HIGH (ref 0.0–0.7)
Eosinophils Relative: 11.7 % — ABNORMAL HIGH (ref 0.0–5.0)
HCT: 45 % (ref 39.0–52.0)
Hemoglobin: 14.8 g/dL (ref 13.0–17.0)
Lymphocytes Relative: 20.7 % (ref 12.0–46.0)
Lymphs Abs: 1.3 10*3/uL (ref 0.7–4.0)
MCHC: 32.9 g/dL (ref 30.0–36.0)
MCV: 96.1 fl (ref 78.0–100.0)
Monocytes Absolute: 0.6 10*3/uL (ref 0.1–1.0)
Monocytes Relative: 9 % (ref 3.0–12.0)
Neutro Abs: 3.7 10*3/uL (ref 1.4–7.7)
Neutrophils Relative %: 57.4 % (ref 43.0–77.0)
Platelets: 211 10*3/uL (ref 150.0–400.0)
RBC: 4.69 Mil/uL (ref 4.22–5.81)
RDW: 13.6 % (ref 11.5–15.5)
WBC: 6.4 10*3/uL (ref 4.0–10.5)

## 2023-02-16 LAB — BASIC METABOLIC PANEL
BUN: 39 mg/dL — ABNORMAL HIGH (ref 6–23)
CO2: 23 mEq/L (ref 19–32)
Calcium: 9.5 mg/dL (ref 8.4–10.5)
Chloride: 105 mEq/L (ref 96–112)
Creatinine, Ser: 1.8 mg/dL — ABNORMAL HIGH (ref 0.40–1.50)
GFR: 37.43 mL/min — ABNORMAL LOW (ref >60.00)
Glucose, Bld: 113 mg/dL — ABNORMAL HIGH (ref 70–99)
Potassium: 4.3 mEq/L (ref 3.5–5.1)
Sodium: 137 mEq/L (ref 135–145)

## 2023-02-16 LAB — HEMOGLOBIN A1C: Hgb A1c MFr Bld: 5.9 % (ref 4.6–6.5)

## 2023-02-16 NOTE — Assessment & Plan Note (Signed)
DM: Currently on Victoza, last A1c great.  Check A1c.  Doing better with exercise HTN: Continue losartan, metoprolol, recommend to check ambulatory BPs. CKD: Checking a BMP DJD, knees: Since the last visit, saw sports medicine, did physical therapy.  Is doing great.  Exercises at least 3 times a week including swimming, weightlifting and cardio. Few months ago he was barely able to walk half block, yesterday he walk 6 miles. Praised! Vaccine advice provided RTC 4 months

## 2023-02-16 NOTE — Progress Notes (Signed)
   Subjective:    Patient ID: Jimmy [name], male    DOB: 1951-04-10, 72 y.o.   MRN: [id number]  DOS:  02/16/2023 Type of visit - description: Follow-up  Doing well, much more active, stamina has increased. No chest pain or DOE. Chronic medical issues addressed.  Wt Readings from Last 3 Encounters:  02/16/23 261 lb (118.4 kg)  12/24/22 263 lb (119.3 kg)  11/09/22 267 lb 6.4 oz (121.3 kg)     Review of Systems See above   Past Medical History:  Diagnosis Date   Arthritis    BCC (basal cell carcinoma of skin) 2013   dr Terri Piedra    Cataract    CKD (chronic kidney disease), stage III (HCC)    Diabetes mellitus    Hypertension    Obesity     Past Surgical History:  Procedure Laterality Date   pilonidal cyst surgery     POLYPECTOMY      Current Outpatient Medications  Medication Instructions   aspirin 81 mg, Oral, Daily, Reported on 01/13/2016   atorvastatin (LIPITOR) 5 mg, Oral, Daily at bedtime   Insulin Pen Needle (TECHLITE PEN NEEDLES) 32G X 6 MM MISC USE AS DIRECTED TO INJECT VICTOZA   losartan (COZAAR) 50 mg, Oral, Daily   metoprolol tartrate (LOPRESSOR) 25 mg, Oral, 2 times daily   tadalafil (CIALIS) 20 MG tablet TAKE 1 TABLET BY MOUTH EVERY OTHER DAY AS NEEDED FOR ERECTILE DYSFUNCTION *NOT COVERED   Victoza 1.2 mg, Subcutaneous, Daily   vitamin C 1,000 mg, Oral, 2 times daily   Vitamin D3 1,000 Units, Oral, 2 times daily,         Objective:   Physical Exam BP 138/68   Pulse (!) 54   Temp 97.9 F (36.6 C) (Oral)   Resp 18   Ht 5\' 10"  (1.778 m)   Wt 261 lb (118.4 kg)   SpO2 96%   BMI 37.45 kg/m  General:   Well developed, NAD, BMI noted. HEENT:  Normocephalic . Face symmetric, atraumatic Lungs:  CTA B Normal respiratory effort, no intercostal retractions, no accessory muscle use. Heart: RRR,  no murmur.  DM foot exam: No edema, good pedal pulses, pinprick examination negative Skin: Not pale. Not jaundice Neurologic:  alert & oriented X3.  Speech  normal, gait: Seems better, more energetic, still somewhat limited by DJD. Psych--  Cognition and judgment appear intact.  Cooperative with normal attention span and concentration.  Behavior appropriate. No anxious or depressed appearing.      Assessment     Assessment  DM : was on glimeperide, it was d/c after wt loss; meds restarted ~ 2018 neuropathy: DX 05-2016  HTN Hyperlipidemia  Morbid Obesity CKD , sees nephrology, Creat ~1.9 BCC 2013 , no regular checks as off 07-2021  DOE 2013, saw cards echo diastolic dysfx,Rx---weight loss and exercise DOE 2018: Chest x-ray and echo negative, could not afford stress test. rx observation     PLAN: DM: Currently on Victoza, last A1c great.  Check A1c.  Doing better with exercise HTN: Continue losartan, metoprolol, recommend to check ambulatory BPs. CKD: Checking a BMP DJD, knees: Since the last visit, saw sports medicine, did physical therapy.  Is doing great.  Exercises at least 3 times a week including swimming, weightlifting and cardio. Few months ago he was barely able to walk half block, yesterday he walk 6 miles. Praised! Vaccine advice provided RTC 4 months

## 2023-02-16 NOTE — Patient Instructions (Addendum)
Vaccines I recommend: Covid booster RSV vaccine Flu shot this fall  Please bring [place] or send [place] a copy of your Healthcare Power of Attorney for your chart.   Check the  blood pressure regularly BP GOAL is between 110/65 and  135/85. If it is consistently higher or lower, let me know     GO TO THE LAB : Get the blood work     GO TO THE FRONT DESK, PLEASE SCHEDULE YOUR APPOINTMENTS Come back for a checkup in 4 months

## 2023-02-18 ENCOUNTER — Encounter: Payer: Self-pay | Admitting: Family Medicine

## 2023-02-18 ENCOUNTER — Other Ambulatory Visit: Payer: Self-pay

## 2023-02-18 ENCOUNTER — Other Ambulatory Visit: Payer: Self-pay | Admitting: Internal Medicine

## 2023-02-18 ENCOUNTER — Ambulatory Visit: Payer: Medicare Other | Admitting: Family Medicine

## 2023-02-18 VITALS — BP 118/78 | HR 58 | Ht 70.0 in | Wt 261.8 lb

## 2023-02-18 DIAGNOSIS — G8929 Other chronic pain: Secondary | ICD-10-CM | POA: Diagnosis not present

## 2023-02-18 DIAGNOSIS — M25561 Pain in right knee: Secondary | ICD-10-CM

## 2023-02-18 DIAGNOSIS — M25562 Pain in left knee: Secondary | ICD-10-CM

## 2023-02-18 DIAGNOSIS — M170 Bilateral primary osteoarthritis of knee: Secondary | ICD-10-CM | POA: Diagnosis not present

## 2023-02-18 NOTE — Progress Notes (Signed)
I, Stevenson Clinch, CMA acting as a scribe for Clementeen Graham, MD.  Jimmy [name] is a 72 y.o. male who presents to [facility] at [facility] today for cont'd bilat knee pain. Pt was last seen by Dr. Denyse Amass on 11/09/22 and at that time his bilat knee pain was not bothersome enough for steroid injections, so he was advised to cont HEP. He previously completed a course of PT and has medial off loader knee braces.  Today, pt reports improvement of knee sx since last visit. Has been able to start walking, walking 5-6 miles, outdoors. Is now able to lift the right knee without assistance. Has upcoming trip to the beach. Denies swelling. Mechanical sx present.   Dx imaging: 08/31/22 R & L knee   Pertinent review of systems: No fevers or chills  Relevant historical information: Hypertension and diabetes   Exam:  BP 118/78   Pulse (!) 58   Ht 5\' 10"  (1.778 m)   Wt 261 lb 12.8 oz (118.8 kg)   SpO2 97%   BMI 37.56 kg/m  General: Well Developed, well nourished, and in no acute distress.   MSK: Knees bilaterally mild effusion.  Normal motion with crepitation.    Lab and Radiology Results  Procedure: Real-time Ultrasound Guided Injection of right knee joint superior lateral patellar space Device: Philips Affiniti 50G Images permanently stored and available for review in PACS Verbal informed consent obtained.  Discussed risks and benefits of procedure. Warned about infection, bleeding, hyperglycemia damage to structures among others. Patient expresses understanding and agreement Time-out conducted.   Noted no overlying erythema, induration, or other signs of local infection.   Skin prepped in a sterile fashion.   Local anesthesia: Topical Ethyl chloride.   With sterile technique and under real time ultrasound guidance: 40 mg of Kenalog and 2 mL Marcaine injected into knee joint. Fluid seen entering the joint capsule.   Completed without difficulty   Pain immediately resolved  suggesting accurate placement of the medication.   Advised to call if fevers/chills, erythema, induration, drainage, or persistent bleeding.   Images permanently stored and available for review in the ultrasound unit.  Impression: Technically successful ultrasound guided injection.    Procedure: Real-time Ultrasound Guided Injection of left knee joint superior lateral patellar space Device: Philips Affiniti 50G Images permanently stored and available for review in PACS Verbal informed consent obtained.  Discussed risks and benefits of procedure. Warned about infection, bleeding, hyperglycemia damage to structures among others. Patient expresses understanding and agreement Time-out conducted.   Noted no overlying erythema, induration, or other signs of local infection.   Skin prepped in a sterile fashion.   Local anesthesia: Topical Ethyl chloride.   With sterile technique and under real time ultrasound guidance: 40 mg of Kenalog and 2 mL of Marcaine injected into knee joint. Fluid seen entering the joint capsule.   Completed without difficulty   Pain immediately resolved suggesting accurate placement of the medication.   Advised to call if fevers/chills, erythema, induration, drainage, or persistent bleeding.   Images permanently stored and available for review in the ultrasound unit.  Impression: Technically successful ultrasound guided injection.         Assessment and Plan: 72 y.o. 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  He has made tremendous progress since his last visit with physical therapy going from basically a completely sedentary life to being able to walk 6 miles yesterday.  He is much more active than he  was prior to PT and is feeling overall much better.  Plan today for steroid injections as he is going to vacation next week and wants to have less pain during vacation which I think is a great idea.  He can return as soon as 3 months for repeat steroid  injections.  We could proceed to hyaluronic acid injections or Zilretta injections sooner if needed.  Will need to authorize this first.  PT was tremendously helpful.   PDMP not reviewed this encounter. Orders Placed This Encounter  Procedures   [place] LIMITED JOINT SPACE STRUCTURES LOW BILAT(NO LINKED CHARGES)    Order Specific Question:   Reason for Exam (SYMPTOM  OR DIAGNOSIS REQUIRED)    Answer:   bilateral knee pain    Order Specific Question:   Preferred imaging location?    Answer:   [facility]   No orders of the defined types were placed in this encounter.    Discussed warning signs or symptoms. Please see discharge instructions. Patient expresses understanding.   The above documentation has been reviewed and is accurate and complete Clementeen Graham, M.D.

## 2023-02-18 NOTE — Patient Instructions (Signed)
Thank you for coming in today.   Call or go to the ER if you develop a large red swollen joint with extreme pain or oozing puss.    [place] [facility].   I can do this same injection every 3 months if needed.   If it does not last longer than 3 months let me know. There are other options we can do.

## 2023-03-08 ENCOUNTER — Other Ambulatory Visit: Payer: Self-pay | Admitting: Internal Medicine

## 2023-03-21 ENCOUNTER — Encounter: Payer: Self-pay | Admitting: Internal Medicine

## 2023-03-22 MED ORDER — TIRZEPATIDE 2.5 MG/0.5ML ~~LOC~~ SOAJ: 2.5000 mg | SUBCUTANEOUS | 0 refills | Status: DC

## 2023-03-22 NOTE — Addendum Note (Signed)
Addended byConrad Culberson D on: 03/22/2023 05:00 PM   Modules accepted: Orders

## 2023-03-22 NOTE — Telephone Encounter (Signed)
Rx sent 

## 2023-03-22 NOTE — Telephone Encounter (Signed)
Remove Victoza Send a prescription for Mounjaro 2.5 mg 1 injection weekly. 1 month supply.

## 2023-03-23 ENCOUNTER — Telehealth: Payer: Self-pay

## 2023-03-23 NOTE — Telephone Encounter (Signed)
PA initiated via Covermymeds; KEY: [id number]. Awaiting determination.

## 2023-03-23 NOTE — Telephone Encounter (Signed)
PA approved. Effective 08/30/2022 - 08/30/2023

## 2023-04-18 ENCOUNTER — Other Ambulatory Visit: Payer: Self-pay | Admitting: Internal Medicine

## 2023-04-19 MED ORDER — TIRZEPATIDE 5 MG/0.5ML ~~LOC~~ SOAJ: 5.0000 mg | SUBCUTANEOUS | 0 refills | Status: DC

## 2023-04-19 NOTE — Telephone Encounter (Signed)
Would you like to increase Mounjaro to 5mg ?

## 2023-04-19 NOTE — Telephone Encounter (Signed)
Yes, thank you.

## 2023-05-16 ENCOUNTER — Other Ambulatory Visit: Payer: Self-pay | Admitting: Internal Medicine

## 2023-05-16 MED ORDER — TIRZEPATIDE 7.5 MG/0.5ML ~~LOC~~ SOAJ: 7.5000 mg | SUBCUTANEOUS | 0 refills | Status: DC

## 2023-05-16 NOTE — Telephone Encounter (Signed)
Rx sent 

## 2023-05-16 NOTE — Telephone Encounter (Signed)
Would you like to increase Mounjaro to 7.5mg ?

## 2023-05-16 NOTE — Telephone Encounter (Addendum)
Yes, okay to move to the next dose, he has an appointment with me soon, will reassess the situation then

## 2023-05-30 ENCOUNTER — Other Ambulatory Visit: Payer: Self-pay | Admitting: Internal Medicine

## 2023-06-11 ENCOUNTER — Other Ambulatory Visit: Payer: Self-pay | Admitting: Internal Medicine

## 2023-06-13 LAB — COMPREHENSIVE METABOLIC PANEL
Albumin: 4.5 (ref 3.5–5.0)
Calcium: 9.5 (ref 8.7–10.7)
eGFR: 46

## 2023-06-13 LAB — IRON,TIBC AND FERRITIN PANEL
%SAT: 44
Ferritin: 669
Iron: 110
TIBC: 251
UIBC: 141

## 2023-06-13 LAB — BASIC METABOLIC PANEL
BUN: 27 — AB (ref 4–21)
CO2: 23 — AB (ref 13–22)
Chloride: 104 (ref 99–108)
Creatinine: 1.6 — AB (ref 0.6–1.3)
Glucose: 108
Potassium: 5 meq/L (ref 3.5–5.1)
Sodium: 138 (ref 137–147)

## 2023-06-13 LAB — CBC AND DIFFERENTIAL: Hemoglobin: 14.9 (ref 13.5–17.5)

## 2023-06-13 LAB — PROTEIN / CREATININE RATIO, URINE: Creatinine, Urine: 110.5

## 2023-06-13 MED ORDER — TIRZEPATIDE 10 MG/0.5ML ~~LOC~~ SOAJ: 10.0000 mg | SUBCUTANEOUS | 0 refills | Status: DC

## 2023-06-15 ENCOUNTER — Other Ambulatory Visit: Payer: Self-pay | Admitting: Internal Medicine

## 2023-06-20 ENCOUNTER — Ambulatory Visit (INDEPENDENT_AMBULATORY_CARE_PROVIDER_SITE_OTHER): Payer: Medicare Other | Admitting: Internal Medicine

## 2023-06-20 VITALS — BP 134/74 | HR 70 | Temp 98.2°F | Resp 18 | Ht 70.0 in | Wt 246.0 lb

## 2023-06-20 DIAGNOSIS — Z23 Encounter for immunization: Secondary | ICD-10-CM | POA: Diagnosis not present

## 2023-06-20 DIAGNOSIS — N1831 Chronic kidney disease, stage 3a: Secondary | ICD-10-CM

## 2023-06-20 DIAGNOSIS — E118 Type 2 diabetes mellitus with unspecified complications: Secondary | ICD-10-CM | POA: Diagnosis not present

## 2023-06-20 DIAGNOSIS — E785 Hyperlipidemia, unspecified: Secondary | ICD-10-CM

## 2023-06-20 DIAGNOSIS — Z7985 Long-term (current) use of injectable non-insulin antidiabetic drugs: Secondary | ICD-10-CM

## 2023-06-20 LAB — LIPID PANEL
Cholesterol: 114 mg/dL (ref 0–200)
HDL: 45.4 mg/dL (ref >39.00)
LDL Cholesterol: 54 mg/dL (ref 0–99)
NonHDL: 69.08
Total CHOL/HDL Ratio: 3
Triglycerides: 76 mg/dL (ref 0.0–149.0)
VLDL: 15.2 mg/dL (ref 0.0–40.0)

## 2023-06-20 LAB — AST: AST: 15 U/L (ref 0–37)

## 2023-06-20 LAB — ALT: ALT: 13 U/L (ref 0–53)

## 2023-06-20 LAB — HEMOGLOBIN A1C: Hgb A1c MFr Bld: 5.8 % (ref 4.6–6.5)

## 2023-06-20 NOTE — Progress Notes (Signed)
   Subjective:    Patient ID: Jimmy [name], male    DOB: April 06, 1951, 72 y.o.   MRN: [id number]  DOS:  06/20/2023 Type of visit - description: f/u Since the last office visit is doing well and has no major concerns. + Weight loss noted, he exercises vigorously without problems.  Wt Readings from Last 3 Encounters:  06/20/23 246 lb (111.6 kg)  02/18/23 261 lb 12.8 oz (118.8 kg)  02/16/23 261 lb (118.4 kg)     Review of Systems See above   Past Medical History:  Diagnosis Date   Arthritis    BCC (basal cell carcinoma of skin) 2013   dr Terri Piedra    Cataract    CKD (chronic kidney disease), stage III (HCC)    Diabetes mellitus    Hypertension    Obesity     Past Surgical History:  Procedure Laterality Date   pilonidal cyst surgery     POLYPECTOMY      Current Outpatient Medications  Medication Instructions   aspirin 81 mg, Oral, Daily, Reported on 01/13/2016   atorvastatin (LIPITOR) 5 mg, Oral, Daily at bedtime   losartan (COZAAR) 50 mg, Oral, Daily   metoprolol tartrate (LOPRESSOR) 25 mg, Oral, 2 times daily   tadalafil (CIALIS) 20 mg, Oral, Every 48 hours PRN   tirzepatide (MOUNJARO) 10 mg, Subcutaneous, Weekly   vitamin C 1,000 mg, Oral, 2 times daily   Vitamin D3 1,000 Units, Oral, 2 times daily,         Objective:   Physical Exam BP 134/74   Pulse 70   Temp 98.2 F (36.8 C)   Resp 18   Ht 5\' 10"  (1.778 m)   Wt 246 lb (111.6 kg)   SpO2 100%   BMI 35.30 kg/m  General:   Well developed, NAD, BMI noted. HEENT:  Normocephalic . Face symmetric, atraumatic Lungs:  CTA B Normal respiratory effort, no intercostal retractions, no accessory muscle use. Heart: RRR,  no murmur.  Lower extremities: no pretibial edema bilaterally  Skin: Not pale. Not jaundice Neurologic:  alert & oriented X3.  Speech normal, gait appropriate for age and unassisted Psych--  Cognition and judgment appear intact.  Cooperative with normal attention span and concentration.   Behavior appropriate. No anxious or depressed appearing.      Assessment    Assessment  DM : was on glimeperide, it was d/c after wt loss; meds restarted ~ 2018 neuropathy: DX 05-2016  HTN Hyperlipidemia  Morbid Obesity CKD , sees nephrology, Creat ~1.9 BCC 2013 , no regular checks as off 07-2021  DOE 2013, saw cards echo diastolic dysfx,Rx---weight loss and exercise DOE 2018: Chest x-ray and echo negative, could not afford stress test. rx observation    PLAN: DM: Greggory Keen was approved 02-2023, he is feeling well, exercises up to 4 times a week, for 3 to 4 hours at a time.  Ambulatory CBGs in the low 100s.  Plan: Check A1c. HTN: Ambulatory BPs in the 120s/70s.  On losartan, metoprolol. CKD: Saw nephrology last week, extensive blood work done per patient, no notes available, next visit 1 year High cholesterol: On Lipitor 10 mg, checking labs Morbid obesity: Doing great, weight started to drop around September, probably d/t a combination of better exercise and Mounjaro. Vaccine advised: Flu shot today, recommend a COVID booster (patient declines, benefits discussed) and RSV. RTC 4 months

## 2023-06-20 NOTE — Patient Instructions (Addendum)
Vaccines I recommend: COVID booster RSV   Check the  blood pressure regularly Blood pressure goal:  between 110/65 and  135/85. If it is consistently higher or lower, let me know     GO TO THE LAB : Get the blood work     Next visit with me in 4 months for a routine checkup Please schedule it at the front desk

## 2023-06-20 NOTE — Assessment & Plan Note (Signed)
DM: Jimmy [name] was approved 02-2023, he is feeling well, exercises up to 4 times a week, for 3 to 4 hours at a time.  Ambulatory CBGs in the low 100s.  Plan: Check A1c. HTN: Ambulatory BPs in the 120s/70s.  On losartan, metoprolol. CKD: Saw nephrology last week, extensive blood work done per patient, no notes available, next visit 1 year High cholesterol: On Lipitor 10 mg, checking labs Morbid obesity: Doing great, weight started to drop around September, probably d/t a combination of better exercise and Mounjaro. Vaccine advised: Flu shot today, recommend a COVID booster (patient declines, benefits discussed) and RSV. RTC 4 months

## 2023-06-22 ENCOUNTER — Encounter: Payer: Self-pay | Admitting: Internal Medicine

## 2023-06-22 LAB — HM DIABETES EYE EXAM

## 2023-07-11 ENCOUNTER — Other Ambulatory Visit: Payer: Self-pay | Admitting: Internal Medicine

## 2023-07-11 MED ORDER — TIRZEPATIDE 12.5 MG/0.5ML ~~LOC~~ SOAJ: 12.5000 mg | SUBCUTANEOUS | 0 refills | Status: DC

## 2023-07-20 ENCOUNTER — Encounter: Payer: Self-pay | Admitting: Internal Medicine

## 2023-08-08 ENCOUNTER — Other Ambulatory Visit: Payer: Self-pay | Admitting: Internal Medicine

## 2023-08-08 MED ORDER — TIRZEPATIDE 15 MG/0.5ML ~~LOC~~ SOAJ: 15.0000 mg | SUBCUTANEOUS | 3 refills | Status: DC

## 2023-08-12 ENCOUNTER — Other Ambulatory Visit: Payer: Self-pay | Admitting: Internal Medicine

## 2023-08-30 NOTE — Progress Notes (Signed)
 I, Leotis Batter, CMA acting as a scribe for Artist Lloyd, MD.  Jimmy [name] is a 72 y.o. male who presents to [facility] at [facility] today for cont'd bilat knee pain. Pt was last seen by Dr. Lloyd on 02/18/23 and was given repeat bilat knee steroid injections.  Today, pt reports significant improvement of bilat knee sx after last visit. Denies visible swelling. Moving better overall. He has upcoming travel plans to [facility] and will be doing a lot of walking. Has been working on strengthening which has been helpful.   Dx imaging: 08/31/22 R & L knee   Pertinent review of systems: No fevers or chills  Relevant historical information: Hypertension and diabetes   Exam:  BP 136/82   Pulse 72   Ht 5' 10 (1.778 m)   SpO2 98%   BMI 35.30 kg/m  General: Well Developed, well nourished, and in no acute distress.   MSK: Knees bilaterally mild effusion normal motion with crepitation.    Lab and Radiology Results   Procedure: Real-time Ultrasound Guided Injection of right knee joint superior lateral patella space Device: Philips Affiniti 50G/GE Logiq Images permanently stored and available for review in PACS Verbal informed consent obtained.  Discussed risks and benefits of procedure. Warned about infection, bleeding, hyperglycemia damage to structures among others. Patient expresses understanding and agreement Time-out conducted.   Noted no overlying erythema, induration, or other signs of local infection.   Skin prepped in a sterile fashion.   Local anesthesia: Topical Ethyl chloride.   With sterile technique and under real time ultrasound guidance: 40 mg of Kenalog and 2 mL of Marcaine injected into knee joint. Fluid seen entering the joint capsule.   Completed without difficulty   Pain immediately resolved suggesting accurate placement of the medication.   Advised to call if fevers/chills, erythema, induration, drainage, or persistent bleeding.   Images  permanently stored and available for review in the ultrasound unit.  Impression: Technically successful ultrasound guided injection.   Procedure: Real-time Ultrasound Guided Injection of left knee joint superior lateral patella space Device: Philips Affiniti 50G/GE Logiq Images permanently stored and available for review in PACS Verbal informed consent obtained.  Discussed risks and benefits of procedure. Warned about infection, bleeding, hyperglycemia damage to structures among others. Patient expresses understanding and agreement Time-out conducted.   Noted no overlying erythema, induration, or other signs of local infection.   Skin prepped in a sterile fashion.   Local anesthesia: Topical Ethyl chloride.   With sterile technique and under real time ultrasound guidance: 40 mg of Kenalog and 2 mL of Marcaine injected into knee joint. Fluid seen entering the joint capsule.   Completed without difficulty   Pain immediately resolved suggesting accurate placement of the medication.   Advised to call if fevers/chills, erythema, induration, drainage, or persistent bleeding.   Images permanently stored and available for review in the ultrasound unit.  Impression: Technically successful ultrasound guided injection.       Assessment and Plan: 72 y.o. male with bilateral knee pain due to exacerbation of DJD.  Plan for steroid injection today.  Check back as needed.   PDMP not reviewed this encounter. Orders Placed This Encounter  Procedures   [place]  LIMITED JOINT SPACE STRUCTURES LOW BILAT(NO LINKED CHARGES)    Reason for Exam (SYMPTOM  OR DIAGNOSIS REQUIRED):   bilat knee pain    Preferred imaging location?:   [facility]   No orders of the defined types  were placed in this encounter.    Discussed warning signs or symptoms. Please see discharge instructions. Patient expresses understanding.   The above documentation has been reviewed and is accurate and  complete Artist Lloyd, M.D.

## 2023-09-01 ENCOUNTER — Ambulatory Visit: Payer: Medicare Other | Admitting: Family Medicine

## 2023-09-01 ENCOUNTER — Other Ambulatory Visit: Payer: Self-pay

## 2023-09-01 ENCOUNTER — Encounter: Payer: Self-pay | Admitting: Family Medicine

## 2023-09-01 VITALS — BP 136/82 | HR 72 | Ht 70.0 in

## 2023-09-01 DIAGNOSIS — M25561 Pain in right knee: Secondary | ICD-10-CM

## 2023-09-01 DIAGNOSIS — G8929 Other chronic pain: Secondary | ICD-10-CM

## 2023-09-01 DIAGNOSIS — M25562 Pain in left knee: Secondary | ICD-10-CM | POA: Diagnosis not present

## 2023-09-01 NOTE — Patient Instructions (Addendum)
 Thank you for coming in today.   You received an injection today. Seek immediate medical attention if the joint becomes red, extremely painful, or is oozing fluid.   Check back as needed

## 2023-09-05 ENCOUNTER — Telehealth: Payer: Self-pay

## 2023-09-05 NOTE — Telephone Encounter (Signed)
 PA initiated via Covermymeds; KEY: [id number]. Awaiting determination.

## 2023-09-05 NOTE — Telephone Encounter (Signed)
 PA approved. This approval authorizes your coverage from 08/31/2023 - 09/04/2024

## 2023-10-03 ENCOUNTER — Other Ambulatory Visit: Payer: Self-pay | Admitting: Internal Medicine

## 2023-10-21 ENCOUNTER — Encounter: Payer: Self-pay | Admitting: Internal Medicine

## 2023-10-21 ENCOUNTER — Ambulatory Visit: Payer: Medicare Other | Admitting: Internal Medicine

## 2023-10-21 VITALS — BP 116/72 | HR 61 | Temp 98.0°F | Resp 16 | Ht 70.0 in | Wt 230.5 lb

## 2023-10-21 DIAGNOSIS — Z125 Encounter for screening for malignant neoplasm of prostate: Secondary | ICD-10-CM

## 2023-10-21 DIAGNOSIS — Z7985 Long-term (current) use of injectable non-insulin antidiabetic drugs: Secondary | ICD-10-CM

## 2023-10-21 DIAGNOSIS — E118 Type 2 diabetes mellitus with unspecified complications: Secondary | ICD-10-CM

## 2023-10-21 DIAGNOSIS — I10 Essential (primary) hypertension: Secondary | ICD-10-CM | POA: Diagnosis not present

## 2023-10-21 DIAGNOSIS — E785 Hyperlipidemia, unspecified: Secondary | ICD-10-CM | POA: Diagnosis not present

## 2023-10-21 LAB — BASIC METABOLIC PANEL
BUN: 20 mg/dL (ref 6–23)
CO2: 26 meq/L (ref 19–32)
Calcium: 9.2 mg/dL (ref 8.4–10.5)
Chloride: 102 meq/L (ref 96–112)
Creatinine, Ser: 1.68 mg/dL — ABNORMAL HIGH (ref 0.40–1.50)
GFR: 40.47 mL/min — ABNORMAL LOW (ref >60.00)
Glucose, Bld: 93 mg/dL (ref 70–99)
Potassium: 4.2 meq/L (ref 3.5–5.1)
Sodium: 137 meq/L (ref 135–145)

## 2023-10-21 LAB — PSA: PSA: 2.18 ng/mL (ref 0.10–4.00)

## 2023-10-21 NOTE — Patient Instructions (Addendum)
 Vaccines I recommend: Covid booster  Check the  blood pressure regularly Blood pressure goal:  between 110/65 and  135/85. If it is consistently higher or lower, let me know     GO TO THE LAB : Get the blood work     Next visit with me in 4 months for a routine checkup Please schedule it at the front desk

## 2023-10-21 NOTE — Progress Notes (Signed)
   Subjective:    Patient ID: Jimmy [name], male    DOB: [birth year], 73 y.o.   MRN: [id number]  DOS:  10/21/2023 Type of visit - description: Follow-up  Since the last office visit is doing great.  He denies any dizziness, lightheadedness or weakness. Diet   continue to be healthy. Exercises regulalrly  Wt Readings from Last 3 Encounters:  10/21/23 230 lb 8 oz (104.6 kg)  06/20/23 246 lb (111.6 kg)  02/18/23 261 lb 12.8 oz (118.8 kg)     Review of Systems See above   Past Medical History:  Diagnosis Date   Arthritis    BCC (basal cell carcinoma of skin) 2013   dr Terri Piedra    Cataract    CKD (chronic kidney disease), stage III (HCC)    Diabetes mellitus    Hypertension    Obesity     Past Surgical History:  Procedure Laterality Date   pilonidal cyst surgery     POLYPECTOMY      Current Outpatient Medications  Medication Instructions   aspirin 81 mg, Daily   atorvastatin (LIPITOR) 5 mg, Oral, Daily   bisacodyl (DULCOLAX) 10 mg, As needed   losartan (COZAAR) 50 mg, Oral, Daily   metoprolol tartrate (LOPRESSOR) 25 mg, Oral, 2 times daily   tadalafil (CIALIS) 20 mg, Oral, Every 48 hours PRN   tirzepatide (MOUNJARO) 15 mg, Subcutaneous, Weekly   vitamin C 1,000 mg, 2 times daily   Vitamin D3 1,000 Units, 2 times daily       Objective:   Physical Exam BP 116/72   Pulse 61   Temp 98 F (36.7 C) (Oral)   Resp 16   Ht 5\' 10"  (1.778 m)   Wt 230 lb 8 oz (104.6 kg)   SpO2 98%   BMI 33.07 kg/m  General:   Well developed, NAD, BMI noted. HEENT:  Normocephalic . Face symmetric, atraumatic Lungs:  CTA B Normal respiratory effort, no intercostal retractions, no accessory muscle use. Heart: RRR,  no murmur.  DM foot exam: Good pedal pulses, pinprick examination normal, + bunnion deformities Skin: Not pale. Not jaundice Neurologic:  alert & oriented X3.  Speech normal, gait appropriate for age and unassisted Psych--  Cognition and judgment appear intact.   Cooperative with normal attention span and concentration.  Behavior appropriate. No anxious or depressed appearing.      Assessment   Assessment  DM : was on glimeperide, it was d/c after wt loss; meds restarted ~ 2018 neuropathy: DX 05-2016  HTN Hyperlipidemia  Morbid Obesity CKD , sees nephrology, Creat ~1.9 BCC 2013 , no regular checks as off 07-2021  DOE 2013, saw cards echo diastolic dysfx,Rx---weight loss and exercise DOE 2018: Chest x-ray and echo negative, could not afford stress test. rx observation    PLAN: DM: Last A1c was 5.8.  On Mounjaro.  Feet exam negative.  Ambulatory CBGs in the morning 100, 110.  No change HTN: Ambulatory BPs range 110, 120.  Currently on losartan, metoprolol.  As he continued to lose weight, recommend to let me know if BPs are consistently below 110.  Adjust meds?. High cholesterol: Last LDL 54.  Continue atorvastatin. CKD: Check BMP. Morbid obesity: On Mounjaro, has changed his diet, smaller portions, healthier food.  Exercises 3 times a week. Preventive care: Check PSA, COVID booster recommended RTC 4 months.

## 2023-10-22 NOTE — Assessment & Plan Note (Signed)
 DM: Last A1c was 5.8.  On Mounjaro.  Feet exam negative.  Ambulatory CBGs in the morning 100, 110.  No change HTN: Ambulatory BPs range 110, 120.  Currently on losartan, metoprolol.  As he continued to lose weight, recommend to let me know if BPs are consistently below 110.  Adjust meds?. High cholesterol: Last LDL 54.  Continue atorvastatin. CKD: Check BMP. Morbid obesity: On Mounjaro, has changed his diet, smaller portions, healthier food.  Exercises 3 times a week. Preventive care: Check PSA, COVID booster recommended RTC 4 months.

## 2023-10-23 ENCOUNTER — Encounter: Payer: Self-pay | Admitting: Internal Medicine

## 2023-11-24 ENCOUNTER — Other Ambulatory Visit: Payer: Self-pay | Admitting: Internal Medicine

## 2023-12-14 ENCOUNTER — Other Ambulatory Visit: Payer: Self-pay | Admitting: Internal Medicine

## 2023-12-16 ENCOUNTER — Other Ambulatory Visit: Payer: Self-pay | Admitting: Internal Medicine

## 2023-12-26 ENCOUNTER — Encounter: Payer: Self-pay | Admitting: Internal Medicine

## 2024-01-06 ENCOUNTER — Encounter: Payer: Self-pay | Admitting: Family Medicine

## 2024-01-09 NOTE — Telephone Encounter (Signed)
 Forwarding to Dr. Denyse Amass to review and advise.

## 2024-01-12 NOTE — Telephone Encounter (Signed)
 I cannot recall.  Do you have the special knee braces from [employer] that required fitting or do you have an off-the-shelf knee brace? Can you see me a picture of the part of the brace that is failing and what kind of brace you have? I will contact the representative to see if they can speed this process up or find a better alternative for you.

## 2024-01-18 ENCOUNTER — Ambulatory Visit (INDEPENDENT_AMBULATORY_CARE_PROVIDER_SITE_OTHER): Payer: PRIVATE HEALTH INSURANCE

## 2024-01-18 VITALS — BP 116/72 | Ht 70.0 in | Wt 215.0 lb

## 2024-01-18 DIAGNOSIS — Z0000 Encounter for general adult medical examination without abnormal findings: Secondary | ICD-10-CM | POA: Diagnosis not present

## 2024-01-18 DIAGNOSIS — Z2821 Immunization not carried out because of patient refusal: Secondary | ICD-10-CM

## 2024-01-18 DIAGNOSIS — E118 Type 2 diabetes mellitus with unspecified complications: Secondary | ICD-10-CM | POA: Diagnosis not present

## 2024-01-18 NOTE — Patient Instructions (Signed)
 Mr. Jimmy [name] , Thank you for taking time out of your busy schedule to complete your Annual Wellness Visit with me. I enjoyed our conversation and look forward to speaking with you again next year. I, as well as your care team,  appreciate your ongoing commitment to your health goals. Please review the following plan we discussed and let me know if I can assist you in the future. Your Game plan/ To Do List    Referrals: If you haven't heard from the office you've been referred to, please reach out to them at the phone provided.   Follow up Visits: Next Medicare AWV with our clinical staff: 01/23/2025   Have you seen your provider in the last 6 months (3 months if uncontrolled diabetes)? Yes Next Office Visit with your provider: 06/23/20025  Clinician Recommendations:  Aim for 30 minutes of exercise or brisk walking, 6-8 glasses of water, and 5 servings of fruits and vegetables each day.       This is a list of the screening recommended for you and due dates:  Health Maintenance  Topic Date Due   Yearly kidney health urinalysis for diabetes  05/23/2010   COVID-19 Vaccine (4 - 2024-25 season) 05/01/2023   Hemoglobin A1C  12/19/2023   Flu Shot  03/30/2024   Eye exam for diabetics  06/21/2024   Yearly kidney function blood test for diabetes  10/20/2024   Complete foot exam   10/20/2024   Medicare Annual Wellness Visit  01/17/2025   Colon Cancer Screening  03/27/2025   DTaP/Tdap/Td vaccine (3 - Td or Tdap) 12/11/2026   Pneumonia Vaccine  Completed   Hepatitis C Screening  Completed   Zoster (Shingles) Vaccine  Completed   HPV Vaccine  Aged Out   Meningitis B Vaccine  Aged Out    Advanced directives: (Declined) Advance directive discussed with you today. Even though you declined this today, please call our office should you change your mind, and we can give you the proper paperwork for you to fill out. Advance Care Planning is important because it:  [x]  Makes sure you receive the medical  care that is consistent with your values, goals, and preferences  [x]  It provides guidance to your family and loved ones and reduces their decisional burden about whether or not they are making the right decisions based on your wishes.  Follow the link provided in your after visit summary or read over the paperwork we have mailed to you to help you started getting your Advance Directives in place. If you need assistance in completing these, please reach out to us  so that we can help you!  See attachments for Preventive Care and Fall Prevention Tips.

## 2024-01-18 NOTE — Progress Notes (Signed)
 Because this visit was a virtual/telehealth visit,  certain criteria was not obtained, such a blood pressure, CBG if applicable, and timed get up and go. Any medications not marked as "taking" were not mentioned during the medication reconciliation part of the visit. Any vitals not documented were not able to be obtained due to this being a telehealth visit or patient was unable to self-report a recent blood pressure reading due to a lack of equipment at home via telehealth. Vitals that have been documented are verbally provided by the patient.  This visit was performed by a medical professional under my direct supervision. I was immediately available for consultation/collaboration. I have reviewed and agree with the Annual Wellness Visit documentation.  Subjective:   Jimmy [name] is a 73 y.o. who presents for a Medicare Wellness preventive visit.  As a reminder, Annual Wellness Visits don't include a physical exam, and some assessments may be limited, especially if this visit is performed virtually. We may recommend an in-person follow-up visit with your provider if needed.  Visit Complete: Virtual I connected with  Jimmy [name] on 01/18/24 by a audio enabled telemedicine application and verified that I am speaking with the correct person using two identifiers.  Patient Location: Home  Provider Location: Home Office  I discussed the limitations of evaluation and management by telemedicine. The patient expressed understanding and agreed to proceed.  Vital Signs: Because this visit was a virtual/telehealth visit, some criteria may be missing or patient reported. Any vitals not documented were not able to be obtained and vitals that have been documented are patient reported.  VideoDeclined- This patient declined Librarian, academic. Therefore the visit was completed with audio only.  Persons Participating in Visit: Patient.  AWV Questionnaire: No: Patient Medicare AWV  questionnaire was not completed prior to this visit.  Cardiac Risk Factors include: advanced age (>40men, >10 women);diabetes mellitus;obesity (BMI >30kg/m2);male gender;hypertension;dyslipidemia     Objective:     Today's Vitals   01/18/24 0932  BP: 116/72  Weight: 215 lb (97.5 kg)  Height: 5\' 10"  (1.778 m)   Body mass index is 30.85 kg/m.     01/18/2024    9:37 AM 12/24/2022    8:49 AM 09/15/2022    9:44 AM 03/14/2015   10:33 AM  Advanced Directives  Does Patient Have a Medical Advance Directive? Yes Yes Yes Yes  Type of Estate agent of [place];Living will Healthcare Power of [place];Living will Healthcare Power of [employer] of [place];Living will  Does patient want to make changes to medical advance directive? No - Patient declined No - Patient declined No - Patient declined   Copy of Healthcare Power of Attorney in Chart? No - copy requested No - copy requested No - copy requested     Current Medications (verified) Outpatient Encounter Medications as of 01/18/2024  Medication Sig   Ascorbic Acid (VITAMIN C) 1000 MG tablet Take 1,000 mg by mouth in the morning and at bedtime.   aspirin 81 MG tablet Take 81 mg by mouth daily. Reported on 01/13/2016   atorvastatin  (LIPITOR) 10 MG tablet Take 0.5 tablets (5 mg total) by mouth daily.   bisacodyl (DULCOLAX) 10 MG suppository Place 10 mg rectally as needed for moderate constipation.   Cholecalciferol (VITAMIN D3) 1000 UNITS CAPS Take 1,000 Units by mouth in the morning and at bedtime.   losartan  (COZAAR ) 100 MG tablet Take 0.5 tablets (50 mg total) by mouth daily.   metoprolol  tartrate (  LOPRESSOR ) 50 MG tablet Take 0.5 tablets (25 mg total) by mouth 2 (two) times daily.   tadalafil  (CIALIS ) 20 MG tablet TAKE 1 TABLET (20 MG TOTAL) BY MOUTH EVERY OTHER DAY AS NEEDED FOR ERECTILE DYSFUNCTION.   tirzepatide  (MOUNJARO ) 15 MG/0.5ML Pen Inject 15 mg into the skin once a week.   No  facility-administered encounter medications on file as of 01/18/2024.    Allergies (verified) Benazepril hcl and Valsartan   History: Past Medical History:  Diagnosis Date   Arthritis    BCC (basal cell carcinoma of skin) 2013   dr Fleurette Humbles    Cataract    CKD (chronic kidney disease), stage III (HCC)    Diabetes mellitus    Hypertension    Obesity    Past Surgical History:  Procedure Laterality Date   pilonidal cyst surgery     POLYPECTOMY     Family History  Problem Relation Age of Onset   Cancer Mother        type?   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, Runner, broadcasting/film/video @ [employer]  Tobacco Use   Smoking status: Never   Smokeless tobacco: Never  Substance and Sexual Activity   Alcohol use: Yes    Alcohol/week: 0.0 standard drinks of alcohol    Comment: 6 beers a year -rare    Drug use: No   Sexual activity: Yes  Other Topics Concern   Not on file  Social History Narrative   Household- pt, wife   Grown children , 8 g-children         Social Drivers of Corporate investment banker Strain: Low Risk  (01/18/2024)   Overall Financial Resource Strain (CARDIA)    Difficulty of Paying Living Expenses: Not very hard  Food Insecurity: No Food Insecurity (01/18/2024)   Hunger Vital Sign    Worried About Running Out of Food in the Last Year: Never true    Ran Out of Food in the Last Year: Never true  Transportation Needs: No Transportation Needs (01/18/2024)    PRAPARE - Administrator, Civil Service (Medical): No    Lack of Transportation (Non-Medical): No  Physical Activity: Sufficiently Active (01/18/2024)   Exercise Vital Sign    Days of Exercise per Week: 3 days    Minutes of Exercise per Session: 60 min  Stress: No Stress Concern Present (01/18/2024)   [employer] of Occupational Health - Occupational Stress Questionnaire    Feeling of Stress : Not at all  Social Connections: Moderately Integrated (01/18/2024)   Social Connection and Isolation Panel [NHANES]    Frequency of Communication with Friends and Family: Once a week    Frequency of Social Gatherings with Friends and Family: Twice a week    Attends Religious  Services: More than 4 times per year    Active Member of Clubs or Organizations: No    Attends Banker Meetings: Never    Marital Status: Married    Tobacco Counseling Counseling given: Not Answered    Clinical Intake:  Pre-visit preparation completed: Yes  Pain : No/denies pain     BMI - recorded: 30.85 Nutritional Status: BMI > 30  Obese Nutritional Risks: None Diabetes: Yes CBG done?: No Did pt. bring in CBG monitor from home?: No  Lab Results  Component Value Date   HGBA1C 5.8 06/20/2023   HGBA1C 5.9 02/16/2023   HGBA1C 5.9 08/17/2022     How often do you need to have someone help you when you read instructions, pamphlets, or other written materials from your doctor or pharmacy?: 1 - Never What is the last grade level you completed in school?: Master degree  Interpreter Needed?: No  Information entered by :: Imanii Gosdin,CMA   Activities of Daily Living     01/18/2024    9:36 AM  In your present state of health, do you have any difficulty performing the following activities:  Hearing? 0  Vision? 0  Difficulty concentrating or making decisions? 0  Walking or climbing stairs? 1  Dressing or bathing? 0  Doing errands, shopping? 0  Preparing Food and eating ? N   Using the Toilet? N  In the past six months, have you accidently leaked urine? N  Do you have problems with loss of bowel control? N  Managing your Medications? N  Managing your Finances? N  Housekeeping or managing your Housekeeping? N    Patient Care Team: Ezell Hollow, MD as PCP - General [place], Kellie, MD as Consulting Physician (Nephrology) Suella Emmer, MD as Referring Physician (Optometry) Darryle Ends, OD as Referring Physician (Optometry)  Indicate any recent Medical Services you may have received from other than [facility] providers in the past year (date may be approximate).     Assessment:    This is a routine wellness examination for Camdin.  Hearing/Vision screen Hearing Screening - Comments:: No hearing difficulties Vision Screening - Comments:: Patient wears glasses    Goals Addressed             This Visit's Progress    Patient Stated       To walk better up stairs       Depression Screen     01/18/2024    9:38 AM 10/21/2023   10:20 AM 06/20/2023    8:59 AM 02/16/2023    8:13 AM 12/24/2022    8:28 AM 08/17/2022   10:47 AM 07/06/2022    2:30 PM  PHQ 2/9 Scores  PHQ - 2 Score 0 0 0 0 0 0 0  PHQ- 9 Score 0          Fall Risk     01/18/2024    9:36 AM 10/21/2023   10:20 AM 06/20/2023    8:59 AM 02/16/2023    8:12 AM 12/17/2022    9:18 AM  Fall Risk   Falls in the past year? 0 0 0 0 0  Number falls in past yr: 0 0 0 0 0  Injury with Fall? 0 0 0 0 0  Risk for fall due to : No Fall Risks    No Fall Risks  Follow up Falls evaluation completed Falls evaluation completed;Education provided Falls evaluation completed Falls evaluation completed Falls evaluation completed    MEDICARE RISK AT HOME:  Medicare Risk at Home Any stairs in or around the home?: Yes If so, are there any without handrails?: No Home free of loose throw rugs in walkways, pet beds, electrical cords, etc?: Yes Adequate lighting in your home to reduce risk of falls?: Yes Life  alert?: No Use of a cane, walker or w/c?: No Grab bars in the bathroom?: Yes Shower chair or bench in shower?: Yes Elevated toilet seat or a handicapped toilet?: No  TIMED UP AND GO:  Was the test performed?  No  Cognitive Function: 6CIT completed        01/18/2024    9:35 AM 12/24/2022    8:44 AM  6CIT Screen  What Year? 0 points 0 points  What month? 0 points 0 points  What time? 0 points 0 points  Count back from 20 0 points 0 points  Months in reverse 0 points 0 points  Repeat phrase 0 points 0 points  Total Score 0 points 0 points    Immunizations Immunization History  Administered Date(s) Administered   Fluad Quad(high Dose 65+) 05/11/2019, 07/28/2020, 07/06/2022   Fluad Trivalent(High Dose 65+) 06/20/2023   H1N1 09/19/2008   Influenza Split 06/17/2011, 06/20/2012   Influenza Whole 09/19/2007, 06/18/2008, 05/23/2009, 07/03/2010   Influenza, High Dose Seasonal PF 06/17/2017, 07/07/2018   Influenza,inj,Quad PF,6+ Mos 07/17/2013, 07/15/2014, 06/11/2016   Influenza-Unspecified 05/30/2015, 05/22/2021   Moderna Sars-Covid-2 Vaccination 11/10/2019, 12/13/2019, 07/28/2020   PNEUMOCOCCAL CONJUGATE-20 08/17/2022   Pneumococcal Conjugate-13 03/07/2014   Pneumococcal Polysaccharide-23 09/26/2009, 07/28/2020   Td 02/15/2007   Tdap 12/10/2016   Zoster Recombinant(Shingrix ) 07/07/2018, 10/03/2018   Zoster, Live 03/15/2013    Screening Tests Health Maintenance  Topic Date Due   Diabetic kidney evaluation - Urine ACR  05/23/2010   COVID-19 Vaccine (4 - 2024-25 season) 05/01/2023   HEMOGLOBIN A1C  12/19/2023   INFLUENZA VACCINE  03/30/2024   OPHTHALMOLOGY EXAM  06/21/2024   Diabetic kidney evaluation - eGFR measurement  10/20/2024   FOOT EXAM  10/20/2024   Medicare Annual Wellness (AWV)  01/17/2025   Colonoscopy  03/27/2025   DTaP/Tdap/Td (3 - Td or Tdap) 12/11/2026   Pneumonia Vaccine 74+ Years old  Completed   Hepatitis C Screening  Completed   Zoster Vaccines-  Shingrix   Completed   HPV VACCINES  Aged Out   Meningococcal B Vaccine  Aged Out    Health Maintenance  Health Maintenance Due  Topic Date Due   Diabetic kidney evaluation - Urine ACR  05/23/2010   COVID-19 Vaccine (4 - 2024-25 season) 05/01/2023   HEMOGLOBIN A1C  12/19/2023   Health Maintenance Items Addressed: A1C, UACR (Urine Albumin:Creatinine Ratio)  Additional Screening:  Vision Screening: Recommended annual ophthalmology exams for early detection of glaucoma and other disorders of the eye.  Dental Screening: Recommended annual dental exams for proper oral hygiene  Community Resource Referral / Chronic Care Management: CRR required this visit?  No   CCM required this visit?  No   Plan:    I have personally reviewed and noted the following in the patient's chart:   Medical and social history Use of alcohol, tobacco or illicit drugs  Current medications and supplements including opioid prescriptions. Patient is not currently taking opioid prescriptions. Functional ability and status Nutritional status Physical activity Advanced directives List of other physicians Hospitalizations, surgeries, and ER visits in previous 12 months Vitals Screenings to include cognitive, depression, and falls Referrals and appointments  In addition, I have reviewed and discussed with patient certain preventive protocols, quality  metrics, and best practice recommendations. A written personalized care plan for preventive services as well as general preventive health recommendations were provided to patient.   Freeda Jerry, [place]   01/18/2024   After Visit Summary: (MyChart) Due to this being a telephonic visit, the after visit summary with patients personalized plan was offered to patient via MyChart   Notes: Nothing significant to report at this time.

## 2024-01-21 ENCOUNTER — Other Ambulatory Visit: Payer: Self-pay | Admitting: Internal Medicine

## 2024-02-20 ENCOUNTER — Ambulatory Visit (INDEPENDENT_AMBULATORY_CARE_PROVIDER_SITE_OTHER): Payer: PRIVATE HEALTH INSURANCE | Admitting: Internal Medicine

## 2024-02-20 ENCOUNTER — Encounter: Payer: Self-pay | Admitting: Internal Medicine

## 2024-02-20 VITALS — BP 124/84 | HR 68 | Temp 98.1°F | Resp 16 | Ht 70.0 in | Wt 212.2 lb

## 2024-02-20 DIAGNOSIS — I10 Essential (primary) hypertension: Secondary | ICD-10-CM

## 2024-02-20 DIAGNOSIS — E785 Hyperlipidemia, unspecified: Secondary | ICD-10-CM | POA: Diagnosis not present

## 2024-02-20 DIAGNOSIS — E118 Type 2 diabetes mellitus with unspecified complications: Secondary | ICD-10-CM | POA: Diagnosis not present

## 2024-02-20 DIAGNOSIS — Z7985 Long-term (current) use of injectable non-insulin antidiabetic drugs: Secondary | ICD-10-CM

## 2024-02-20 DIAGNOSIS — E669 Obesity, unspecified: Secondary | ICD-10-CM | POA: Diagnosis not present

## 2024-02-20 LAB — MICROALBUMIN / CREATININE URINE RATIO
Creatinine,U: 94 mg/dL
Microalb Creat Ratio: 70.4 mg/g — ABNORMAL HIGH (ref 0.0–30.0)
Microalb, Ur: 6.6 mg/dL — ABNORMAL HIGH (ref 0.0–1.9)

## 2024-02-20 LAB — BASIC METABOLIC PANEL WITH GFR
BUN: 39 mg/dL — ABNORMAL HIGH (ref 6–23)
CO2: 24 meq/L (ref 19–32)
Calcium: 9.6 mg/dL (ref 8.4–10.5)
Chloride: 106 meq/L (ref 96–112)
Creatinine, Ser: 1.71 mg/dL — ABNORMAL HIGH (ref 0.40–1.50)
GFR: 39.53 mL/min — ABNORMAL LOW (ref >60.00)
Glucose, Bld: 99 mg/dL (ref 70–99)
Potassium: 4.4 meq/L (ref 3.5–5.1)
Sodium: 137 meq/L (ref 135–145)

## 2024-02-20 LAB — CBC WITH DIFFERENTIAL/PLATELET
Basophils Absolute: 0.1 10*3/uL (ref 0.0–0.1)
Basophils Relative: 1.9 % (ref 0.0–3.0)
Eosinophils Absolute: 0.5 10*3/uL (ref 0.0–0.7)
Eosinophils Relative: 10.7 % — ABNORMAL HIGH (ref 0.0–5.0)
HCT: 43.9 % (ref 39.0–52.0)
Hemoglobin: 14.9 g/dL (ref 13.0–17.0)
Lymphocytes Relative: 21.4 % (ref 12.0–46.0)
Lymphs Abs: 1 10*3/uL (ref 0.7–4.0)
MCHC: 33.9 g/dL (ref 30.0–36.0)
MCV: 94.9 fl (ref 78.0–100.0)
Monocytes Absolute: 0.5 10*3/uL (ref 0.1–1.0)
Monocytes Relative: 9.3 % (ref 3.0–12.0)
Neutro Abs: 2.8 10*3/uL (ref 1.4–7.7)
Neutrophils Relative %: 56.7 % (ref 43.0–77.0)
Platelets: 241 10*3/uL (ref 150.0–400.0)
RBC: 4.62 Mil/uL (ref 4.22–5.81)
RDW: 13.6 % (ref 11.5–15.5)
WBC: 4.9 10*3/uL (ref 4.0–10.5)

## 2024-02-20 LAB — AST: AST: 14 U/L (ref 0–37)

## 2024-02-20 LAB — TSH: TSH: 2.04 u[IU]/mL (ref 0.35–5.50)

## 2024-02-20 LAB — HEMOGLOBIN A1C: Hgb A1c MFr Bld: 5.6 % (ref 4.6–6.5)

## 2024-02-20 LAB — ALT: ALT: 13 U/L (ref 0–53)

## 2024-02-20 MED ORDER — LOSARTAN POTASSIUM 25 MG PO TABS: 25.0000 mg | ORAL_TABLET | Freq: Every day | ORAL | 1 refills | Status: DC

## 2024-02-20 NOTE — Patient Instructions (Addendum)
 Change losartan  to 25 mg 1 tablet a day.  Prescription sent.     Check the  blood pressure regularly Blood pressure goal:  between 110/65 and  135/85. If it is consistently higher or lower, let me know     GO TO THE LAB :  Get the blood work   Your results will be posted on MyChart with my comments  Next office visit for a checkup in 4 to 5 months Please make an appointment before you leave today

## 2024-02-20 NOTE — Assessment & Plan Note (Signed)
 DM: On Mounjaro , doing great with diet, he exercises 4 to 5 weeks a day.  Ambulatory CBGs around 100.  Plan: A1c, micro, TSH. HTN: On losartan  50 mg daily, metoprolol . Ambulatory BP range from 115/60 to 108/62.  Occasionally gets slightly dizzy. Plan: Decrease losartan  to 25 mg daily, check BMP CBC.  Continue monitoring BPs. Obesity: Doing great on Mounjaro , if he is own his scale has lost 125 pounds RTC 4 to 5 months

## 2024-02-20 NOTE — Progress Notes (Signed)
   Subjective:    Patient ID: Jimmy [name], male    DOB: 1950-12-27, 73 y.o.   MRN: [id number]  DOS:  02/20/2024 Type of visit - description: Follow-up  Chronic medical problems addressed. From time to time he feels dizzy when he stands up.  Symptoms last few seconds. No associated chest pain or difficulty breathing.  No palpitation.  Denies nausea or vomiting.  No diarrhea or blood in the stools.  Wonders if symptoms are related to his blood pressure which is at times low.  Wt Readings from Last 3 Encounters:  02/20/24 212 lb 4 oz (96.3 kg)  01/18/24 215 lb (97.5 kg)  10/21/23 230 lb 8 oz (104.6 kg)    Review of Systems See above   Past Medical History:  Diagnosis Date   Arthritis    BCC (basal cell carcinoma of skin) 2013   dr Ivin    Cataract    CKD (chronic kidney disease), stage III (HCC)    Diabetes mellitus    Hypertension    Obesity     Past Surgical History:  Procedure Laterality Date   pilonidal cyst surgery     POLYPECTOMY      Current Outpatient Medications  Medication Instructions   aspirin 81 mg, Daily   atorvastatin  (LIPITOR) 5 mg, Oral, Daily   bisacodyl (DULCOLAX) 10 mg, As needed   losartan  (COZAAR ) 25 mg, Oral, Daily   metoprolol  tartrate (LOPRESSOR ) 25 mg, Oral, 2 times daily   Mounjaro  15 mg, Subcutaneous, Weekly   tadalafil  (CIALIS ) 20 mg, Oral, Every 48 hours PRN   vitamin C 1,000 mg, 2 times daily   Vitamin D3 1,000 Units, 2 times daily       Objective:   Physical Exam BP 124/84   Pulse 68   Temp 98.1 F (36.7 C) (Oral)   Resp 16   Ht 5' 10 (1.778 m)   Wt 212 lb 4 oz (96.3 kg)   SpO2 96%   BMI 30.45 kg/m  General:   Well developed, NAD, BMI noted. HEENT:  Normocephalic . Face symmetric, atraumatic Lungs:  CTA B Normal respiratory effort, no intercostal retractions, no accessory muscle use. Heart: RRR,  no murmur.  Lower extremities: no pretibial edema bilaterally  Skin: Not pale. Not jaundice Neurologic:  alert &  oriented X3.  Speech normal, gait appropriate for age and unassisted Psych--  Cognition and judgment appear intact.  Cooperative with normal attention span and concentration.  Behavior appropriate. No anxious or depressed appearing.      Assessment   Assessment  DM : was on glimeperide, it was d/c after wt loss; meds restarted ~ 2018 neuropathy: DX 05-2016  HTN Hyperlipidemia  Morbid Obesity CKD , sees nephrology, Creat ~1.9 BCC 2013 , no regular checks as off 07-2021  DOE 2013, saw cards echo diastolic dysfx,Rx---weight loss and exercise DOE 2018: Chest x-ray and echo negative, could not afford stress test. rx observation    PLAN: DM: On Mounjaro , doing great with diet, he exercises 4 to 5 weeks a day.  Ambulatory CBGs around 100.  Plan: A1c, micro, TSH. HTN: On losartan  50 mg daily, metoprolol . Ambulatory BP range from 115/60 to 108/62.  Occasionally gets slightly dizzy. Plan: Decrease losartan  to 25 mg daily, check BMP CBC.  Continue monitoring BPs. Obesity: Doing great on Mounjaro , if he is own his scale has lost 125 pounds RTC 4 to 5 months

## 2024-02-21 ENCOUNTER — Ambulatory Visit: Payer: Self-pay | Admitting: Internal Medicine

## 2024-03-15 ENCOUNTER — Other Ambulatory Visit: Payer: Self-pay | Admitting: Internal Medicine

## 2024-06-02 ENCOUNTER — Other Ambulatory Visit: Payer: Self-pay | Admitting: Internal Medicine

## 2024-06-05 ENCOUNTER — Encounter: Payer: Self-pay | Admitting: Internal Medicine

## 2024-06-15 ENCOUNTER — Encounter: Payer: Self-pay | Admitting: Internal Medicine

## 2024-06-15 ENCOUNTER — Ambulatory Visit: Payer: Self-pay

## 2024-06-15 NOTE — Telephone Encounter (Signed)
 Appt scheduled

## 2024-06-15 NOTE — Telephone Encounter (Signed)
 FYI Only or Action Required?: FYI only for provider.  Patient was last seen in primary care on 02/20/2024 by Amon Aloysius BRAVO, MD.  Called Nurse Triage reporting Dizziness.  Symptoms began several weeks ago.  Interventions attempted: Rest, hydration, or home remedies.  Symptoms are: unchanged.  Triage Disposition: See PCP When Office is Open (Within 3 Days)  Patient/caregiver understands and will follow disposition?: YesCopied from CRM [id number]. Topic: Clinical - Red Word Triage >> Jun 15, 2024  1:20 PM Deaijah H wrote: Red Word that prompted transfer to Nurse Triage: Dizziness when getting up , while sitting or laying down sometimes. Reason for Disposition  [1] MILD dizziness (e.g., vertigo; walking normally) AND [2] has NOT been evaluated by doctor (or NP/PA) for this  Answer Assessment - Initial Assessment Questions For the last few weeks, patient has had some positional dizziness described as spinning for a few seconds then it passes. First time happened at the gym sitting up from his weight bench. Denies CP/SOB. Denies vision changes, trouble with coordination, or unilateral weakness/paresthesias. Patient has lost 135lbs in the last year. His BGL have been stabilized to BG- 90-108 and his BP has been consistently in the low 100's over 50's. He is working out multiple days a week, retired so he isnt dealing with work stress anymore.  Takes his BP and BG every time he has an episode. He keeps a log of all his vitals. No hypotension or hypoglycemia noted.  Has been on Mounjaro  for about a year  Appt made for Monday to talk about adjusting medications. Patient agrees to go to the ED/UC if worsens or does not resolve. Precautions given and understood.   1. DESCRIPTION: Describe your dizziness.     Spinning  2. VERTIGO: Do you feel like either you or the room is spinning or tilting?      Feels the room spinning, even when eyes closed only a few seconds 3. LIGHTHEADED: Do you feel  lightheaded? (e.g., somewhat faint, woozy, weak upon standing)     denies 4. SEVERITY: How bad is it?  Can you walk?     Has to stop and let it pass 5. ONSET:  When did the dizziness begin?     A couple weeks 6. AGGRAVATING FACTORS: Does anything make it worse? (e.g., standing, change in head position)     Positional changes 7. CAUSE: What do you think is causing the dizziness?     Increased activity, weight loss, and meds at same dose 8. RECURRENT SYMPTOM: Have you had dizziness before? If Yes, ask: When was the last time? What happened that time?     Positional  9. OTHER SYMPTOMS: Do you have any other symptoms? (e.g., earache, headache, numbness, tinnitus, vomiting, weakness)     Denies  Protocols used: Dizziness - Vertigo-A-AH

## 2024-06-18 ENCOUNTER — Ambulatory Visit (INDEPENDENT_AMBULATORY_CARE_PROVIDER_SITE_OTHER): Admitting: Family Medicine

## 2024-06-18 ENCOUNTER — Encounter: Payer: Self-pay | Admitting: Family Medicine

## 2024-06-18 VITALS — BP 116/70 | HR 62 | Temp 97.7°F | Resp 18 | Ht 70.0 in | Wt 206.2 lb

## 2024-06-18 DIAGNOSIS — E1165 Type 2 diabetes mellitus with hyperglycemia: Secondary | ICD-10-CM

## 2024-06-18 DIAGNOSIS — R42 Dizziness and giddiness: Secondary | ICD-10-CM | POA: Diagnosis not present

## 2024-06-18 DIAGNOSIS — T383X5A Adverse effect of insulin and oral hypoglycemic [antidiabetic] drugs, initial encounter: Secondary | ICD-10-CM

## 2024-06-18 DIAGNOSIS — E785 Hyperlipidemia, unspecified: Secondary | ICD-10-CM | POA: Diagnosis not present

## 2024-06-18 DIAGNOSIS — I159 Secondary hypertension, unspecified: Secondary | ICD-10-CM

## 2024-06-18 DIAGNOSIS — Z7985 Long-term (current) use of injectable non-insulin antidiabetic drugs: Secondary | ICD-10-CM

## 2024-06-18 DIAGNOSIS — M170 Bilateral primary osteoarthritis of knee: Secondary | ICD-10-CM

## 2024-06-18 DIAGNOSIS — Z23 Encounter for immunization: Secondary | ICD-10-CM | POA: Diagnosis not present

## 2024-06-18 DIAGNOSIS — K5903 Drug induced constipation: Secondary | ICD-10-CM

## 2024-06-18 LAB — VITAMIN B12: Vitamin B-12: 196 pg/mL — ABNORMAL LOW (ref 211–911)

## 2024-06-18 LAB — CBC WITH DIFFERENTIAL/PLATELET
Basophils Absolute: 0.1 K/uL (ref 0.0–0.1)
Basophils Relative: 1.7 % (ref 0.0–3.0)
Eosinophils Absolute: 0.7 K/uL (ref 0.0–0.7)
Eosinophils Relative: 15.1 % — ABNORMAL HIGH (ref 0.0–5.0)
HCT: 42.2 % (ref 39.0–52.0)
Hemoglobin: 14.2 g/dL (ref 13.0–17.0)
Lymphocytes Relative: 22.3 % (ref 12.0–46.0)
Lymphs Abs: 1 K/uL (ref 0.7–4.0)
MCHC: 33.7 g/dL (ref 30.0–36.0)
MCV: 95 fl (ref 78.0–100.0)
Monocytes Absolute: 0.4 K/uL (ref 0.1–1.0)
Monocytes Relative: 9.5 % (ref 3.0–12.0)
Neutro Abs: 2.3 K/uL (ref 1.4–7.7)
Neutrophils Relative %: 51.4 % (ref 43.0–77.0)
Platelets: 188 K/uL (ref 150.0–400.0)
RBC: 4.44 Mil/uL (ref 4.22–5.81)
RDW: 13.4 % (ref 11.5–15.5)
WBC: 4.4 K/uL (ref 4.0–10.5)

## 2024-06-18 LAB — COMPREHENSIVE METABOLIC PANEL WITH GFR
ALT: 12 U/L (ref 0–53)
AST: 13 U/L (ref 0–37)
Albumin: 4.5 g/dL (ref 3.5–5.2)
Alkaline Phosphatase: 55 U/L (ref 39–117)
BUN: 37 mg/dL — ABNORMAL HIGH (ref 6–23)
CO2: 25 meq/L (ref 19–32)
Calcium: 9.2 mg/dL (ref 8.4–10.5)
Chloride: 103 meq/L (ref 96–112)
Creatinine, Ser: 1.71 mg/dL — ABNORMAL HIGH (ref 0.40–1.50)
GFR: 39.43 mL/min — ABNORMAL LOW (ref >60.00)
Glucose, Bld: 91 mg/dL (ref 70–99)
Potassium: 4.4 meq/L (ref 3.5–5.1)
Sodium: 137 meq/L (ref 135–145)
Total Bilirubin: 0.7 mg/dL (ref 0.2–1.2)
Total Protein: 6.7 g/dL (ref 6.0–8.3)

## 2024-06-18 LAB — TSH: TSH: 2.11 u[IU]/mL (ref 0.35–5.50)

## 2024-06-18 MED ORDER — METOPROLOL TARTRATE 25 MG PO TABS: 12.5000 mg | ORAL_TABLET | Freq: Two times a day (BID) | ORAL | Status: DC

## 2024-06-18 NOTE — Progress Notes (Signed)
 Subjective:    Patient ID: Jimmy [name], male    DOB: 05-08-1951, 73 y.o.   MRN: [id number]  Chief Complaint  Patient presents with   Dizziness    Sxs started 2 weeks ago, no other sxs. Pt states happens with laying to sitting up and sitting to standing. No chest pain, headache    HPI Patient is in today for dizziness.  Discussed the use of AI scribe software for clinical note transcription with the patient, who gave verbal consent to proceed.  History of Present Illness Jimmy [name] is a 73 year old male with type 2 diabetes and chronic kidney disease who presents with dizziness.  He has been experiencing dizziness for the past couple of weeks, described as a sensation of spinning in his head. The dizziness first occurred while at the gym on a leg press machine and has since happened a few times when getting out of bed or standing up. Each episode lasts for two to three seconds. No falls, weakness, changes in vision, or heart palpitations are associated with these episodes.  He has a history of type 2 diabetes and chronic kidney disease, with kidney function at 40%. His blood pressure has been running lower than usual, with a previous reading of 124/84 in June. He has been more active, exercising regularly, and has lost weight, which he attributes to his lower blood pressure.  He is currently taking metoprolol , 25 mg twice daily, and losartan . He notes that he was previously cutting a 50 mg metoprolol  tablet in half but now takes 25 mg tablets. He also takes a laxative pill daily, sometimes twice a day, to manage constipation associated with Mounjaro  use.  No recent illness, including diarrhea, nausea, vomiting, or fever. He experiences some nasal congestion after swimming, which he attributes to chlorine exposure. He ensures adequate hydration, carrying a water bottle with him except when swimming.  His social history includes being retired, with a significant increase in physical activity,  including gym workouts, swimming, and walking. He has a history of significant weight loss and lifestyle changes, moving from a sedentary lifestyle to a more active one. He has four children in their forties.    Past Medical History:  Diagnosis Date   Arthritis    BCC (basal cell carcinoma of skin) 2013   dr Ivin    Cataract    CKD (chronic kidney disease), stage III (HCC)    Diabetes mellitus    Hypertension    Obesity     Past Surgical History:  Procedure Laterality Date   pilonidal cyst surgery     POLYPECTOMY      Family History  Problem Relation Age of Onset   Cancer Mother        type?   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, Runner, broadcasting/film/video @ [employer]  Tobacco Use   Smoking status: Never   Smokeless tobacco: Never  Substance and Sexual Activity   Alcohol use: Yes    Alcohol/week: 0.0 standard drinks of alcohol    Comment: 6 beers a year -rare    Drug use: No   Sexual activity: Yes  Other Topics Concern   Not on file  Social History Narrative   Household- pt, wife   Grown children , 8 g-children         Social Drivers of Corporate investment banker Strain: Low Risk  (06/15/2024)   Overall Financial Resource Strain (CARDIA)    Difficulty of Paying Living Expenses: Not very hard  Food Insecurity: No Food Insecurity (06/15/2024)   Hunger Vital Sign    Worried About Running Out of Food in the Last Year: Never true    Ran Out of Food in the Last  Year: Never true  Transportation Needs: No Transportation Needs (06/15/2024)   PRAPARE - Administrator, Civil Service (Medical): No    Lack of Transportation (Non-Medical): No  Physical Activity: Sufficiently Active (06/15/2024)   Exercise Vital Sign    Days of Exercise per Week: 3 days    Minutes of Exercise per Session: 120 min  Stress: No Stress Concern Present (06/15/2024)   [employer] of Occupational Health - Occupational Stress Questionnaire    Feeling of Stress: Not at all  Social Connections: Moderately Integrated (06/15/2024)   Social Connection and Isolation Panel    Frequency of Communication with Friends and Family: Once a week    Frequency of Social Gatherings with Friends and Family: Three times a week    Attends Religious Services: More than 4 times per year    Active Member of Clubs or Organizations: No    Attends Banker Meetings: Not on file    Marital Status: Married  Intimate Partner Violence: Not At Risk (01/18/2024)   Humiliation, Afraid, Rape, and Kick questionnaire    Fear of Current or Ex-Partner: No    Emotionally Abused: No    Physically Abused: No    Sexually Abused: No    Outpatient Medications Prior to Visit  Medication Sig Dispense Refill   Ascorbic Acid (VITAMIN C) 1000 MG tablet Take 1,000 mg by mouth in the morning and at bedtime.     aspirin 81 MG tablet Take 81 mg by mouth daily. Reported on 01/13/2016     atorvastatin  (LIPITOR) 10 MG tablet Take 0.5 tablets (5 mg total) by mouth daily. 45 tablet 1   Cholecalciferol (VITAMIN D3) 1000 UNITS CAPS Take 1,000 Units by mouth in the morning and at bedtime.     losartan  (COZAAR ) 25 MG tablet Take 1 tablet (25 mg total) by mouth daily. 90 tablet 1   tadalafil  (CIALIS ) 20 MG tablet TAKE 1 TABLET (20 MG TOTAL) BY MOUTH EVERY OTHER DAY AS NEEDED FOR ERECTILE DYSFUNCTION. 10 tablet 1   tirzepatide  (MOUNJARO ) 15 MG/0.5ML Pen Inject 15 mg into the skin once a week. 2 mL 3    metoprolol  tartrate (LOPRESSOR ) 50 MG tablet Take 0.5 tablets (25 mg total) by mouth 2 (two) times daily. 90 tablet 1   bisacodyl (DULCOLAX) 10 MG suppository Place 10 mg rectally as needed for moderate constipation.     No facility-administered medications prior to visit.    Allergies  Allergen Reactions   Benazepril Hcl     REACTION:  cough   Valsartan Other (See Comments)    Unknown reaction    Review of Systems  Constitutional:  Negative for chills, fever and malaise/fatigue.  HENT:  Negative for congestion and hearing loss.   Eyes:  Negative for blurred vision and discharge.  Respiratory:  Negative for cough, sputum production and shortness of breath.   Cardiovascular:  Negative for chest pain, palpitations and leg swelling.  Gastrointestinal:  Negative for abdominal pain, blood in stool, constipation, diarrhea, heartburn, nausea and vomiting.  Genitourinary:  Negative for dysuria, frequency, hematuria and urgency.  Musculoskeletal:  Negative for back pain, falls and myalgias.  Skin:  Negative for rash.  Neurological:  Positive for dizziness. Negative for sensory change, loss of consciousness, weakness and headaches.  Endo/Heme/Allergies:  Negative for environmental allergies. Does not bruise/bleed easily.  Psychiatric/Behavioral:  Negative for depression and suicidal ideas. The patient is not nervous/anxious and does not have insomnia.        Objective:    Physical Exam Vitals and nursing note reviewed.  Constitutional:      General: He is not in acute distress.    Appearance: Normal appearance. He is well-developed.  HENT:     Head: Normocephalic and atraumatic.  Eyes:     General: No scleral icterus.       Right eye: No discharge.        Left eye: No discharge.  Cardiovascular:     Rate and Rhythm: Normal rate and regular rhythm.     Heart sounds: No murmur heard. Pulmonary:     Effort: Pulmonary effort is normal. No respiratory distress.     Breath sounds:  Normal breath sounds.  Musculoskeletal:        General: Normal range of motion.     Cervical back: Normal range of motion and neck supple.     Right lower leg: No edema.     Left lower leg: No edema.  Skin:    General: Skin is warm and dry.  Neurological:     Mental Status: He is alert and oriented to person, place, and time.  Psychiatric:        Mood and Affect: Mood normal.        Behavior: Behavior normal.        Thought Content: Thought content normal.        Judgment: Judgment normal.     BP 116/70 (BP Location: Right Arm, Patient Position: Sitting, Cuff Size: Large)   Pulse 62   Temp 97.7 F (36.5 C) (Oral)   Resp 18   Ht 5' 10 (1.778 m)   Wt 206 lb 3.2 oz (93.5 kg)   SpO2 97%   BMI 29.59 kg/m  Wt Readings from Last 3 Encounters:  06/18/24 206 lb 3.2 oz (93.5 kg)  02/20/24 212 lb 4 oz (96.3 kg)  01/18/24 215 lb (97.5 kg)    Diabetic Foot Exam - Simple   No data filed    Lab Results  Component Value Date   WBC 4.9 02/20/2024   HGB 14.9 02/20/2024   HCT 43.9 02/20/2024   PLT 241.0 02/20/2024   GLUCOSE 99 02/20/2024   CHOL 114 06/20/2023   TRIG 76.0 06/20/2023   HDL 45.40 06/20/2023   LDLDIRECT 119.4 03/15/2011   LDLCALC 54 06/20/2023   ALT 13 02/20/2024   AST 14 02/20/2024   NA 137 02/20/2024   K 4.4 02/20/2024   CL 106 02/20/2024   CREATININE 1.71 (H) 02/20/2024   BUN 39 (H)  02/20/2024   CO2 24 02/20/2024   TSH 2.04 02/20/2024   PSA 2.18 10/21/2023   HGBA1C 5.6 02/20/2024   MICROALBUR 6.6 (H) 02/20/2024    Lab Results  Component Value Date   TSH 2.04 02/20/2024   Lab Results  Component Value Date   WBC 4.9 02/20/2024   HGB 14.9 02/20/2024   HCT 43.9 02/20/2024   MCV 94.9 02/20/2024   PLT 241.0 02/20/2024   Lab Results  Component Value Date   NA 137 02/20/2024   K 4.4 02/20/2024   CO2 24 02/20/2024   GLUCOSE 99 02/20/2024   BUN 39 (H) 02/20/2024   CREATININE 1.71 (H) 02/20/2024   BILITOT 1.0 02/01/2022   ALKPHOS 54 02/01/2022    AST 14 02/20/2024   ALT 13 02/20/2024   PROT 6.8 02/01/2022   ALBUMIN 4.5 06/13/2023   CALCIUM  9.6 02/20/2024   EGFR 46 06/13/2023   GFR 39.53 (L) 02/20/2024   Lab Results  Component Value Date   CHOL 114 06/20/2023   Lab Results  Component Value Date   HDL 45.40 06/20/2023   Lab Results  Component Value Date   LDLCALC 54 06/20/2023   Lab Results  Component Value Date   TRIG 76.0 06/20/2023   Lab Results  Component Value Date   CHOLHDL 3 06/20/2023   Lab Results  Component Value Date   HGBA1C 5.6 02/20/2024       Assessment & Plan:  Dizziness Assessment & Plan: ? Low bp---  cut metoprolol  dose in 1/2 F/u 2-3 weeks  Check labs  If syptoms worsen --- return to office or go to Er   Orders: -     CBC with Differential/Platelet -     Comprehensive metabolic panel with GFR -     TSH -     Vitamin B12  Secondary hypertension -     Metoprolol  Tartrate; Take 0.5 tablets (12.5 mg total) by mouth 2 (two) times daily. -     CBC with Differential/Platelet -     Comprehensive metabolic panel with GFR -     TSH -     Vitamin B12  Hyperlipidemia, unspecified hyperlipidemia type  Type 2 diabetes mellitus with hyperglycemia, without long-term current use of insulin  (HCC) -     Comprehensive metabolic panel with GFR -     TSH -     Vitamin B12  Need for influenza vaccination -     Flu vaccine HIGH DOSE PF(Fluzone Trivalent)  Assessment and Plan Assessment & Plan Dizziness (vertigo)   He experiences intermittent dizziness with a spinning sensation, often upon standing, over the past few weeks. There are no associated falls, weakness, or changes in vision. Possible causes include low blood pressure due to weight loss and increased physical activity. Inner ear issues are considered, but no fluid is noted in the ears. No heart palpitations are reported. Order blood work to rule out other causes. Provide home exercises for potential inner ear involvement. Follow up in  2-3 weeks or sooner if symptoms persist.  Hypertension and hypotensive symptoms   His blood pressure is running lower than usual, likely due to weight loss and increased physical activity. The current regimen includes losartan  and metoprolol , with metoprolol  identified as a potential cause of dizziness. Monitor blood pressure closely. Follow up in 2-3 weeks or sooner if symptoms persist.  Chronic kidney disease, stage 3   He has CKD stage 3 with 40% kidney function. Blood pressure management is crucial to protect kidney  function. Continue current management and attend the nephrology appointment next week.  Type 2 diabetes mellitus   His blood sugars are well-managed with no recent episodes of hypoglycemia. Management includes lifestyle modifications and medication.  Constipation due to medication   He experiences constipation associated with Mounjaro  use, managed with daily laxatives. Continue the current laxative regimen as needed.  Osteoarthritis of knees   He has chronic knee pain managed with physical activity and weight loss. He uses knee braces and follows a regimen focused on range of motion rather than weight resistance.  General Health Maintenance   He agreed to receive a flu vaccination. Administer the flu shot.    Delsin Copen R Lowne Chase, DO

## 2024-06-18 NOTE — Patient Instructions (Signed)
 How to Perform the Epley Maneuver The Epley maneuver is an exercise that relieves symptoms of vertigo. Vertigo is the feeling that you or your surroundings are moving when they are not. When you feel vertigo, you may feel like the room is spinning and may have trouble walking. The Epley maneuver is used for a type of vertigo caused by a calcium deposit in a part of the inner ear. The maneuver involves changing head positions to help the deposit move out of the area. You can do this maneuver at home whenever you have symptoms of vertigo. You can repeat it in 24 hours if your vertigo has not gone away. Even though the Epley maneuver may relieve your vertigo for a few weeks, it is possible that your symptoms will return. This maneuver relieves vertigo, but it does not relieve dizziness. What are the risks? If it is done correctly, the Epley maneuver is considered safe. Sometimes it can lead to dizziness or nausea that goes away after a short time. If you develop other symptoms--such as changes in vision, weakness, or numbness--stop doing the maneuver and call your health care provider. Supplies needed: A bed or table. A pillow. How to do the Epley maneuver     Sit on the edge of a bed or table with your back straight and your legs extended or hanging over the edge of the bed or table. Turn your head halfway toward the affected ear or side as told by your health care provider. Lie backward quickly with your head turned until you are lying flat on your back. Your head should dangle (head-hanging position). You may want to position a pillow under your shoulders. Hold this position for at least 30 seconds. If you feel dizzy or have symptoms of vertigo, continue to hold the position until the symptoms stop. Turn your head to the opposite direction until your unaffected ear is facing down. Your head should continue to dangle. Hold this position for at least 30 seconds. If you feel dizzy or have symptoms of  vertigo, continue to hold the position until the symptoms stop. Turn your whole body to the same side as your head so that you are positioned on your side. Your head will now be nearly facedown and no longer needs to dangle. Hold for at least 30 seconds. If you feel dizzy or have symptoms of vertigo, continue to hold the position until the symptoms stop. Sit back up. You can repeat the maneuver in 24 hours if your vertigo does not go away. Follow these instructions at home: For 24 hours after doing the Epley maneuver: Keep your head in an upright position. When lying down to sleep or rest, keep your head raised (elevated) with two or more pillows. Avoid excessive neck movements. Activity Do not drive or use machinery if you feel dizzy. After doing the Epley maneuver, return to your normal activities as told by your health care provider. Ask your health care provider what activities are safe for you. General instructions Drink enough fluid to keep your urine pale yellow. Do not drink alcohol. Take over-the-counter and prescription medicines only as told by your health care provider. Keep all follow-up visits. This is important. Preventing vertigo symptoms Ask your health care provider if there is anything you should do at home to prevent vertigo. He or she may recommend that you: Keep your head elevated with two or more pillows while you sleep. Do not sleep on the side of your affected ear. Get  up slowly from bed. Avoid sudden movements during the day. Avoid extreme head positions or movement, such as looking up or bending over. Contact a health care provider if: Your vertigo gets worse. You have other symptoms, including: Nausea. Vomiting. Headache. Get help right away if you: Have vision changes. Have a headache or neck pain that is severe or getting worse. Cannot stop vomiting. Have new numbness or weakness in any part of your body. These symptoms may represent a serious problem  that is an emergency. Do not wait to see if the symptoms will go away. Get medical help right away. Call your local emergency services (911 in the [place] Do not drive yourself to the hospital. Summary Vertigo is the feeling that you or your surroundings are moving when they are not. The Epley maneuver is an exercise that relieves symptoms of vertigo. If the Epley maneuver is done correctly, it is considered safe. This information is not intended to replace advice given to you by your health care provider. Make sure you discuss any questions you have with your health care provider. Document Revised: 05/13/2023 Document Reviewed: 05/13/2023 Elsevier Patient Education  2024 [employer].

## 2024-06-18 NOTE — Assessment & Plan Note (Signed)
?   Low bp---  cut metoprolol  dose in 1/2 F/u 2-3 weeks  Check labs  If syptoms worsen --- return to office or go to Er

## 2024-06-21 ENCOUNTER — Other Ambulatory Visit: Payer: Self-pay | Admitting: Internal Medicine

## 2024-06-21 DIAGNOSIS — I159 Secondary hypertension, unspecified: Secondary | ICD-10-CM

## 2024-06-21 MED ORDER — LOSARTAN POTASSIUM 25 MG PO TABS: 25.0000 mg | ORAL_TABLET | Freq: Every day | ORAL | 1 refills | Status: DC

## 2024-06-24 ENCOUNTER — Ambulatory Visit: Payer: Self-pay | Admitting: Family Medicine

## 2024-06-27 LAB — PROTEIN / CREATININE RATIO, URINE
Albumin, U: 67.8
Creatinine, Urine: 114.1

## 2024-06-27 LAB — MICROALBUMIN / CREATININE URINE RATIO: Microalb Creat Ratio: 59

## 2024-06-27 LAB — HM DIABETES EYE EXAM

## 2024-06-28 ENCOUNTER — Encounter: Payer: Self-pay | Admitting: Internal Medicine

## 2024-07-03 ENCOUNTER — Encounter: Payer: Self-pay | Admitting: Internal Medicine

## 2024-07-09 ENCOUNTER — Encounter: Payer: Self-pay | Admitting: Family Medicine

## 2024-07-09 ENCOUNTER — Ambulatory Visit: Payer: PRIVATE HEALTH INSURANCE | Admitting: Family Medicine

## 2024-07-09 VITALS — BP 124/78 | HR 64 | Temp 98.4°F | Resp 18 | Ht 70.0 in | Wt 204.0 lb

## 2024-07-09 DIAGNOSIS — I10 Essential (primary) hypertension: Secondary | ICD-10-CM | POA: Diagnosis not present

## 2024-07-09 DIAGNOSIS — E1165 Type 2 diabetes mellitus with hyperglycemia: Secondary | ICD-10-CM

## 2024-07-09 DIAGNOSIS — E785 Hyperlipidemia, unspecified: Secondary | ICD-10-CM

## 2024-07-09 NOTE — Progress Notes (Signed)
 -  Subjective:    Patient ID: Jimmy [name], male    DOB: 01-Aug-1951, 73 y.o.   MRN: [id number]  Chief Complaint  Patient presents with   Dizziness   Hypotension   Follow-up    HPI Patient is in today for bp f/u.  Discussed the use of AI scribe software for clinical note transcription with the patient, who gave verbal consent to proceed.  History of Present Illness Jimmy [name] is a 73 year old male who presents for a follow-up visit.  His blood pressure is well-controlled without medication after discontinuing previous antihypertensive medications due to hypotension. He was initially on two medications, which were stopped by his nephrologist. He was advised to resume half the dose of losartan  if needed, but his blood pressure remains stable without any medication.  He has experienced significant weight loss, previously weighing 333 pounds and now maintaining around 260 pounds. This weight loss was achieved through dietary changes and increased physical activity, including joining a gym and swimming. He focuses on mobility rather than weight loss itself, reporting improved mobility and reduced pain and stiffness, which previously made activities like walking difficult.  His diabetes is under control, with a recent A1c of 5.6. He is not currently on any diabetes medication, attributing his control to regular exercise, including swimming and gym workouts. Recent lab work showed normal results, including normal protein levels in urine.  He is currently taking Mounjaro , baby aspirin, Lipitor (half a pill), and Cialis . He also takes multiple vitamins. He notes that Mounjaro  helps with weight management by reducing 'food noise'.  He swims on Saturdays, goes to the gym on Mondays, and walks on Thursdays, which he considers his 'diabetes medicine'. He finds swimming particularly beneficial for cardiovascular health without impacting his joints.    Past Medical History:  Diagnosis Date   Arthritis     BCC (basal cell carcinoma of skin) 2013   dr Ivin    Cataract    CKD (chronic kidney disease), stage III (HCC)    Diabetes mellitus    Hypertension    Obesity     Past Surgical History:  Procedure Laterality Date   pilonidal cyst surgery     POLYPECTOMY      Family History  Problem Relation Age of Onset   Cancer Mother        type?   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, runner, broadcasting/film/video @ [employer]  Tobacco Use   Smoking status: Never   Smokeless tobacco: Never  Substance and Sexual Activity   Alcohol use: Yes    Alcohol/week: 0.0 standard drinks of alcohol    Comment: 6 beers a year -rare    Drug use: No   Sexual activity: Yes  Other Topics Concern  Not on file  Social History Narrative   Household- pt, wife   Grown children , 8 g-children         Social Drivers of Corporate Investment Banker Strain: Low Risk  (06/15/2024)   Overall Financial Resource Strain (CARDIA)    Difficulty of Paying Living Expenses: Not very hard  Food Insecurity: No Food Insecurity (06/15/2024)   Hunger Vital Sign    Worried About Running Out of Food in the Last Year: Never true    Ran Out of Food in the Last Year: Never true  Transportation Needs: No Transportation Needs (06/15/2024)   PRAPARE - Administrator, Civil Service (Medical): No    Lack of Transportation (Non-Medical): No  Physical Activity: Sufficiently Active (06/15/2024)   Exercise Vital Sign     Days of Exercise per Week: 3 days    Minutes of Exercise per Session: 120 min  Stress: No Stress Concern Present (06/15/2024)   [employer] of Occupational Health - Occupational Stress Questionnaire    Feeling of Stress: Not at all  Social Connections: Moderately Integrated (06/15/2024)   Social Connection and Isolation Panel    Frequency of Communication with Friends and Family: Once a week    Frequency of Social Gatherings with Friends and Family: Three times a week    Attends Religious Services: More than 4 times per year    Active Member of Clubs or Organizations: No    Attends Banker Meetings: Not on file    Marital Status: Married  Intimate Partner Violence: Not At Risk (01/18/2024)   Humiliation, Afraid, Rape, and Kick questionnaire    Fear of Current or Ex-Partner: No    Emotionally Abused: No    Physically Abused: No    Sexually Abused: No    Outpatient Medications Prior to Visit  Medication Sig Dispense Refill   Ascorbic Acid (VITAMIN C) 1000 MG tablet Take 1,000 mg by mouth in the morning and at bedtime.     aspirin 81 MG tablet Take 81 mg by mouth daily. Reported on 01/13/2016     atorvastatin  (LIPITOR) 10 MG tablet Take 0.5 tablets (5 mg total) by mouth daily. 45 tablet 1   Cholecalciferol (VITAMIN D3) 1000 UNITS CAPS Take 1,000 Units by mouth in the morning and at bedtime.     tadalafil  (CIALIS ) 20 MG tablet TAKE 1 TABLET (20 MG TOTAL) BY MOUTH EVERY OTHER DAY AS NEEDED FOR ERECTILE DYSFUNCTION. 10 tablet 1   tirzepatide  (MOUNJARO ) 15 MG/0.5ML Pen Inject 15 mg into the skin once a week. 2 mL 3   losartan  (COZAAR ) 25 MG tablet Take 1 tablet (25 mg total) by mouth daily. 90 tablet 1   metoprolol  tartrate (LOPRESSOR ) 25 MG tablet Take 1 tablet (25 mg total) by mouth 2 (two) times daily. 180 tablet 1   No facility-administered medications prior to visit.    Allergies  Allergen Reactions   Benazepril Hcl     REACTION: cough   Valsartan Other (See  Comments)    Unknown reaction    Review of Systems  Constitutional:  Negative for fever and malaise/fatigue.  HENT:  Negative for congestion.   Eyes:  Negative for blurred vision.  Respiratory:  Negative for shortness of breath.   Cardiovascular:  Negative for chest pain, palpitations and leg swelling.  Gastrointestinal:  Negative for abdominal pain, blood in stool and nausea.  Genitourinary:  Negative for dysuria and frequency.  Musculoskeletal:  Negative for falls.  Skin:  Negative for rash.  Neurological:  Negative for dizziness, loss of consciousness and headaches.  Endo/Heme/Allergies:  Negative for environmental allergies.  Psychiatric/Behavioral:  Negative for depression. The patient is not nervous/anxious.        Objective:    Physical Exam Vitals and nursing note reviewed.  Constitutional:      General: He is not in acute distress.    Appearance: Normal appearance. He is well-developed.  HENT:     Head: Normocephalic and atraumatic.  Eyes:     General: No scleral icterus.       Right eye: No discharge.        Left eye: No discharge.  Cardiovascular:     Rate and Rhythm: Normal rate and regular rhythm.     Heart sounds: No murmur heard. Pulmonary:     Effort: Pulmonary effort is normal. No respiratory distress.     Breath sounds: Normal breath sounds.  Musculoskeletal:        General: Normal range of motion.     Cervical back: Normal range of motion and neck supple.     Right lower leg: No edema.     Left lower leg: No edema.  Skin:    General: Skin is warm and dry.  Neurological:     Mental Status: He is alert and oriented to person, place, and time.  Psychiatric:        Mood and Affect: Mood normal.        Behavior: Behavior normal.        Thought Content: Thought content normal.        Judgment: Judgment normal.     BP 124/78 (BP Location: Left Arm, Patient Position: Sitting, Cuff Size: Normal)   Pulse 64   Temp 98.4 F (36.9 C) (Oral)   Resp 18    Ht 5' 10 (1.778 m)   Wt 204 lb (92.5 kg)   SpO2 99%   BMI 29.27 kg/m  Wt Readings from Last 3 Encounters:  07/09/24 204 lb (92.5 kg)  06/18/24 206 lb 3.2 oz (93.5 kg)  02/20/24 212 lb 4 oz (96.3 kg)    Diabetic Foot Exam - Simple   No data filed    Lab Results  Component Value Date   WBC 4.4 06/18/2024   HGB 14.2 06/18/2024   HCT 42.2 06/18/2024   PLT 188.0 06/18/2024   GLUCOSE 91 06/18/2024   CHOL 114 06/20/2023   TRIG 76.0 06/20/2023   HDL 45.40 06/20/2023   LDLDIRECT 119.4 03/15/2011   LDLCALC 54 06/20/2023   ALT 12 06/18/2024   AST 13 06/18/2024   NA 137 06/18/2024   K 4.4 06/18/2024   CL 103 06/18/2024   CREATININE 1.71 (H) 06/18/2024   BUN 37 (H) 06/18/2024   CO2 25 06/18/2024   TSH 2.11 06/18/2024   PSA 2.18 10/21/2023   HGBA1C 5.6 02/20/2024   MICROALBUR 6.6 (H) 02/20/2024    Lab Results  Component Value Date   TSH 2.11 06/18/2024   Lab Results  Component Value Date   WBC 4.4 06/18/2024   HGB 14.2 06/18/2024   HCT 42.2 06/18/2024   MCV 95.0 06/18/2024   PLT 188.0 06/18/2024   Lab Results  Component Value Date   NA 137 06/18/2024   K 4.4 06/18/2024   CO2 25 06/18/2024   GLUCOSE 91 06/18/2024   BUN 37 (H) 06/18/2024   CREATININE 1.71 (H) 06/18/2024   BILITOT 0.7 06/18/2024   ALKPHOS 55 06/18/2024   AST 13  06/18/2024   ALT 12 06/18/2024   PROT 6.7 06/18/2024   ALBUMIN 4.5 06/18/2024   CALCIUM  9.2 06/18/2024   EGFR 46 06/13/2023   GFR 39.43 (L) 06/18/2024   Lab Results  Component Value Date   CHOL 114 06/20/2023   Lab Results  Component Value Date   HDL 45.40 06/20/2023   Lab Results  Component Value Date   LDLCALC 54 06/20/2023   Lab Results  Component Value Date   TRIG 76.0 06/20/2023   Lab Results  Component Value Date   CHOLHDL 3 06/20/2023   Lab Results  Component Value Date   HGBA1C 5.6 02/20/2024       Assessment & Plan:  Primary hypertension Assessment & Plan: Well controlled, no changes to meds.  Encouraged heart healthy diet such as the DASH diet and exercise as tolerated.     Hyperlipidemia, unspecified hyperlipidemia type Assessment & Plan: Encourage heart healthy diet such as MIND or DASH diet, increase exercise, avoid trans fats, simple carbohydrates and processed foods, consider a krill or fish or flaxseed oil cap daily.     Type 2 diabetes mellitus with hyperglycemia, without long-term current use of insulin  (HCC)   Assessment and Plan Assessment & Plan Type 2 diabetes mellitus   Type 2 diabetes mellitus is well controlled with an A1c of 5.6% as of June. He is not on any diabetes medications. Recent lab work showed normal protein levels, indicating good kidney function. Continue current lifestyle modifications including exercise and diet. Follow up with Doctor Amon in January for routine lab work.  Hyperlipidemia   Hyperlipidemia is managed with Lipitor, currently taking half a pill. Continue Lipitor at the current dose.  Obesity, improved with weight loss   Obesity has significantly improved with a weight loss of approximately 100 pounds due to lifestyle changes including regular exercise and dietary modifications. Mounjaro  has been beneficial in reducing food cravings and aiding weight loss. Continue the current exercise regimen including swimming, gym workouts, and walking. Continue Mounjaro  as it aids in weight management.  General Health Maintenance   He engages in regular physical activity including swimming, gym workouts, and walking. There is no current need for blood pressure medication due to improved weight and lifestyle changes. Continue regular physical activity and healthy lifestyle choices.   Katrenia Alkins R Lowne Chase, DO

## 2024-07-10 ENCOUNTER — Other Ambulatory Visit: Payer: Self-pay | Admitting: Internal Medicine

## 2024-07-19 NOTE — Assessment & Plan Note (Signed)
 Well controlled, no changes to meds. Encouraged heart healthy diet such as the DASH diet and exercise as tolerated.

## 2024-07-19 NOTE — Assessment & Plan Note (Signed)
 Encourage heart healthy diet such as MIND or DASH diet, increase exercise, avoid trans fats, simple carbohydrates and processed foods, consider a krill or fish or flaxseed oil cap daily.

## 2024-07-24 ENCOUNTER — Ambulatory Visit: Payer: PRIVATE HEALTH INSURANCE | Admitting: Internal Medicine

## 2024-07-25 ENCOUNTER — Other Ambulatory Visit: Payer: Self-pay | Admitting: Internal Medicine

## 2024-08-06 ENCOUNTER — Other Ambulatory Visit: Payer: Self-pay | Admitting: Internal Medicine

## 2024-09-03 ENCOUNTER — Encounter: Payer: Self-pay | Admitting: Internal Medicine

## 2024-09-03 ENCOUNTER — Ambulatory Visit: Payer: PRIVATE HEALTH INSURANCE | Admitting: Internal Medicine

## 2024-09-03 VITALS — BP 136/76 | HR 60 | Temp 98.0°F | Resp 18 | Ht 70.0 in | Wt 201.4 lb

## 2024-09-03 DIAGNOSIS — E785 Hyperlipidemia, unspecified: Secondary | ICD-10-CM | POA: Diagnosis not present

## 2024-09-03 DIAGNOSIS — Z7985 Long-term (current) use of injectable non-insulin antidiabetic drugs: Secondary | ICD-10-CM | POA: Diagnosis not present

## 2024-09-03 DIAGNOSIS — N1831 Chronic kidney disease, stage 3a: Secondary | ICD-10-CM | POA: Diagnosis not present

## 2024-09-03 DIAGNOSIS — E1165 Type 2 diabetes mellitus with hyperglycemia: Secondary | ICD-10-CM

## 2024-09-03 DIAGNOSIS — N183 Chronic kidney disease, stage 3 unspecified: Secondary | ICD-10-CM | POA: Diagnosis not present

## 2024-09-03 DIAGNOSIS — I10 Essential (primary) hypertension: Secondary | ICD-10-CM | POA: Diagnosis not present

## 2024-09-03 DIAGNOSIS — K4090 Unilateral inguinal hernia, without obstruction or gangrene, not specified as recurrent: Secondary | ICD-10-CM

## 2024-09-03 DIAGNOSIS — E0822 Diabetes mellitus due to underlying condition with diabetic chronic kidney disease: Secondary | ICD-10-CM | POA: Insufficient documentation

## 2024-09-03 NOTE — Assessment & Plan Note (Addendum)
 Right inguinal hernia He reported intermittent discomfort in the right groin, described as burning.  On exam he has a right groin hernia, unclear if that explains the pain. Plan: Refer to general surgery.  DM with CKD Blood sugar levels range from 93 to 110 mg/dL.  Weight loss noted, likely due to intense and frequent exercise and Mounjaro . BMI is now optimal at 23. - Ordered A1c and microalbumin tests and a UA. - Continue Mounjaro  15 mg subcutaneous weekly. - No need to continue losing weight. Morbid obesity: See above comments HTN Blood pressure well-controlled at 136/76 mmHg.  On no medication, lifestyle controlled High cholesterol: Currently taking atorvastatin  5 mg daily.  Check FLP. OTC supplements: Takes 16 OTC supplements. They include vitamin D3, vitamin K, vitamin C, cod liver, zinc, collagen etc. Recommend to limit OTCs to a multivitamin, bisacodyl as needed for constipation. Preventive care discussed RTC 4 to 5 months

## 2024-09-03 NOTE — Assessment & Plan Note (Signed)
 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  Patient aware -Prostate cancer screening: PSA was 2.1 (February 2025)

## 2024-09-03 NOTE — Progress Notes (Signed)
 "  Subjective:    Patient ID: Jimmy [name], male    DOB: [birth year], 73 y.o.   MRN: [id number]  DOS:  09/03/2024 Follow-up  Discussed the use of AI scribe software for clinical note transcription with the patient, who gave verbal consent to proceed.  History of Present Illness Jimmy [name] is a 74 year old male who presents for a follow-up visit    He reports right groin pain described as an intermittent burning or tingling sensation in the right groin for approximately three weeks - Mild in intensity - Occurs mainly when sitting in certain positions - Resolves with movement - No associated rash, bulge, injury, or scrotal/testicular swelling  Constitutional symptoms - Denies night sweats other than 2 times last week without fever chills or cough  Gastrointestinal and genitourinary symptoms - No nausea, vomiting, or diarrhea - No urinary symptoms  Cardiometabolic status, weight loss - Home blood pressure readings typically in the 130s/70s - Home blood glucose between 93 and 110 - Maintains regular intense exercise     Wt Readings from Last 3 Encounters:  09/03/24 201 lb 6 oz (91.3 kg)  07/09/24 204 lb (92.5 kg)  06/18/24 206 lb 3.2 oz (93.5 kg)     Review of Systems See above   Past Medical History:  Diagnosis Date   Arthritis    BCC (basal cell carcinoma of skin) 2013   dr Ivin    Cataract    CKD (chronic kidney disease), stage III (HCC)    Diabetes mellitus    Hypertension    Obesity     Past Surgical History:  Procedure Laterality Date   pilonidal cyst surgery     POLYPECTOMY      Current Outpatient Medications  Medication Instructions   aspirin 81 mg, Daily   atorvastatin  (LIPITOR) 5 mg, Oral, Daily   Mounjaro  15 mg, Subcutaneous, Weekly   tadalafil  (CIALIS ) 20 mg, Oral, Every 48 hours PRN   vitamin C 1,000 mg, 2 times daily   Vitamin D3 1,000 Units, 2 times daily       Objective:   Physical Exam BP 136/76   Pulse 60   Temp 98 F (36.7 C)  (Oral)   Resp 18   Ht 5' 10 (1.778 m)   Wt 201 lb 6 oz (91.3 kg)   SpO2 98%   BMI 28.89 kg/m  General: Well developed, NAD, BMI noted Neck: No  thyromegaly  HEENT:  Normocephalic . Face symmetric, atraumatic Lungs:  CTA B Normal respiratory effort, no intercostal retractions, no accessory muscle use. Heart: RRR,  no murmur.  Abdomen:  Not distended, soft, non-tender. No rebound or rigidity. Has a reducible right inguinal hernia.  Scrotal contents normal Lower extremities: no pretibial edema bilaterally  Skin: Exposed areas without rash. Not pale. Not jaundice Neurologic:  alert & oriented X3.  Speech normal, gait appropriate for age and unassisted Strength symmetric and appropriate for age.  Psych: Cognition and judgment appear intact.  Cooperative with normal attention span and concentration.  Behavior appropriate. No anxious or depressed appearing.     Assessment   Assessment  DM : was on glimeperide, it was d/c after wt loss; meds restarted ~ 2018 neuropathy: DX 05-2016  HTN Hyperlipidemia  Morbid Obesity CKD , sees nephrology, Creat ~1.9 BCC 2013 , no regular checks as off 07-2021  DOE 2013, saw cards echo diastolic dysfx,Rx---weight loss and exercise DOE 2018: Chest x-ray and echo negative, could not afford stress test. rx observation  Assessment & Plan Right inguinal hernia He reported intermittent discomfort in the right groin, described as burning.  On exam he has a right groin hernia, unclear if that explains the pain. Plan: Refer to general surgery.  DM with CKD Blood sugar levels range from 93 to 110 mg/dL.  Weight loss noted, likely due to intense and frequent exercise and Mounjaro . BMI is now optimal at 23. - Ordered A1c and microalbumin tests and a UA. - Continue Mounjaro  15 mg subcutaneous weekly. - No need to continue losing weight. Morbid obesity: See above comments HTN Blood pressure well-controlled at 136/76 mmHg.  On no medication,  lifestyle controlled High cholesterol: Currently taking atorvastatin  5 mg daily.  Check FLP. OTC supplements: Takes 16 OTC supplements. They include vitamin D3, vitamin K, vitamin C, cod liver, zinc, collagen etc. Recommend to limit OTCs to a multivitamin, bisacodyl as needed for constipation. 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  Patient aware -Prostate cancer screening: PSA was 2.1 (February 2025)  RTC 4 to 5 months      "

## 2024-09-03 NOTE — Patient Instructions (Addendum)
 THE FOLLOWING IS A SUMMARY OF YOUR INSTRUCTION PLEASE REVIEW THEM BEFORE YOU LEAVE THE OFFICE AND LET US  KNOW IF YOU HAVE QUESTIONS    GO TO THE LAB :  Get the blood work    Go to the front desk for the checkout Please make an appointment for a follow-up in 4 to 5 months  I strongly recommend you to stop the 16 supplements you take. I do recommend: Aspirin 81 mg daily, a multivitamin and perhaps something for constipation. Creatine and excessive protein and not recommended because your kidney function.  Check the  blood pressure regularly Blood pressure goal:  between 110/65 and  130/80. If it is consistently higher or lower, let me know  Diabetes  You can check your sugars at different times  - early in AM fasting  ( blood sugar goal 70-130) - 2 hours after a meal (blood sugar goal less than 180)       RIGHT INGUINAL HERNIA: You have been experiencing an intermittent burning sensation in your right groin, which may be related to a right inguinal hernia. -You have been referred to a surgeon for further evaluation.  TYPE 2 DIABETES MELLITUS: Your blood sugar levels are well-controlled,  -We have ordered A1c and microalbumin tests to monitor your diabetes. -Continue taking Mounjaro  15 mg subcutaneously once a week. - You do not need to continue to lose weight.  ESSENTIAL HYPERTENSION: Your blood pressure is well-controlled with your current regimen. -Continue with your current blood pressure management regimen.  HYPERLIPIDEMIA: You are currently taking atorvastatin  to manage your cholesterol levels. -We have ordered a cholesterol test to monitor your levels.  GENERAL HEALTH MAINTENANCE: We reviewed your vaccinations and I recommend:  -Get a COVID booster shot. -Obtain an RSV vaccine at the pharmacy. - Also, your colonoscopy is scheduled for June.

## 2024-09-04 LAB — URINALYSIS, ROUTINE W REFLEX MICROSCOPIC
Bilirubin Urine: NEGATIVE
Hgb urine dipstick: NEGATIVE
Ketones, ur: NEGATIVE
Leukocytes,Ua: NEGATIVE
Nitrite: NEGATIVE
RBC / HPF: NONE SEEN
Specific Gravity, Urine: 1.01 (ref 1.000–1.030)
Total Protein, Urine: 30 — AB
Urine Glucose: NEGATIVE
Urobilinogen, UA: 0.2 (ref 0.0–1.0)
WBC, UA: NONE SEEN
pH: 6 (ref 5.0–8.0)

## 2024-09-04 LAB — HEMOGLOBIN A1C: Hgb A1c MFr Bld: 5.2 % (ref 4.6–6.5)

## 2024-09-04 LAB — LIPID PANEL
Cholesterol: 115 mg/dL (ref 28–200)
HDL: 56.8 mg/dL
LDL Cholesterol: 48 mg/dL (ref 10–99)
NonHDL: 57.79
Total CHOL/HDL Ratio: 2
Triglycerides: 50 mg/dL (ref 10.0–149.0)
VLDL: 10 mg/dL (ref 0.0–40.0)

## 2024-09-04 LAB — MICROALBUMIN / CREATININE URINE RATIO
Creatinine,U: 60 mg/dL
Microalb Creat Ratio: 342 mg/g — ABNORMAL HIGH (ref 0.0–30.0)
Microalb, Ur: 20.5 mg/dL — ABNORMAL HIGH (ref 0.7–1.9)

## 2024-09-06 ENCOUNTER — Ambulatory Visit: Payer: Self-pay | Admitting: Internal Medicine

## 2024-09-06 DIAGNOSIS — N1831 Chronic kidney disease, stage 3a: Secondary | ICD-10-CM

## 2024-09-06 DIAGNOSIS — I10 Essential (primary) hypertension: Secondary | ICD-10-CM

## 2024-09-06 DIAGNOSIS — E1165 Type 2 diabetes mellitus with hyperglycemia: Secondary | ICD-10-CM

## 2024-09-06 MED ORDER — LOSARTAN POTASSIUM 25 MG PO TABS: 25.0000 mg | ORAL_TABLET | Freq: Every day | ORAL | 0 refills | Status: AC

## 2025-01-01 ENCOUNTER — Ambulatory Visit: Payer: PRIVATE HEALTH INSURANCE | Admitting: Internal Medicine

## 2025-01-23 ENCOUNTER — Ambulatory Visit: Payer: PRIVATE HEALTH INSURANCE
